# Patient Record
Sex: Male | Born: 1935
Health system: Southern US, Community
[De-identification: ages and names within clinical notes are randomized; demographics above are authoritative.]

## PROBLEM LIST (undated history)

## (undated) DIAGNOSIS — I509 Heart failure, unspecified: Secondary | ICD-10-CM

## (undated) DIAGNOSIS — I209 Angina pectoris, unspecified: Secondary | ICD-10-CM

## (undated) DIAGNOSIS — Z950 Presence of cardiac pacemaker: Secondary | ICD-10-CM

## (undated) DIAGNOSIS — R3121 Asymptomatic microscopic hematuria: Secondary | ICD-10-CM

## (undated) DIAGNOSIS — I4891 Unspecified atrial fibrillation: Secondary | ICD-10-CM

## (undated) DIAGNOSIS — Z7901 Long term (current) use of anticoagulants: Secondary | ICD-10-CM

## (undated) DIAGNOSIS — Z95810 Presence of automatic (implantable) cardiac defibrillator: Secondary | ICD-10-CM

## (undated) DIAGNOSIS — I2510 Atherosclerotic heart disease of native coronary artery without angina pectoris: Secondary | ICD-10-CM

## (undated) DIAGNOSIS — E785 Hyperlipidemia, unspecified: Secondary | ICD-10-CM

## (undated) DIAGNOSIS — I10 Essential (primary) hypertension: Secondary | ICD-10-CM

## (undated) DIAGNOSIS — F0390 Unspecified dementia without behavioral disturbance: Secondary | ICD-10-CM

## (undated) HISTORY — DX: Essential (primary) hypertension: I10

## (undated) HISTORY — PX: CORONARY ARTERY BYPASS GRAFT: SHX141

## (undated) HISTORY — DX: Heart failure, unspecified: I50.9

## (undated) HISTORY — DX: Atherosclerotic heart disease of native coronary artery without angina pectoris: I25.10

## (undated) HISTORY — DX: Hyperlipidemia, unspecified: E78.5

## (undated) HISTORY — PX: MITRAL VALVE REPAIR (MV)/CORONARY ARTERY BYPASS GRAFTING (CABG): SHX5983

---

## 2003-12-10 HISTORY — PX: CORONARY ARTERY BYPASS GRAFT: SHX141

## 2003-12-10 HISTORY — PX: MITRAL VALVULOPLASTY: SHX143

## 2009-09-26 HISTORY — PX: CARDIAC DEFIBRILLATOR PLACEMENT: SHX171

## 2014-08-12 ENCOUNTER — Encounter: Payer: Self-pay | Admitting: Cardiovascular Disease

## 2014-08-12 ENCOUNTER — Ambulatory Visit (INDEPENDENT_AMBULATORY_CARE_PROVIDER_SITE_OTHER): Payer: Medicare Other | Admitting: *Deleted

## 2014-08-12 ENCOUNTER — Ambulatory Visit (INDEPENDENT_AMBULATORY_CARE_PROVIDER_SITE_OTHER): Payer: Medicare Other | Admitting: Pharmacist Clinician (PhC)/ Clinical Pharmacy Specialist

## 2014-08-12 ENCOUNTER — Ambulatory Visit (INDEPENDENT_AMBULATORY_CARE_PROVIDER_SITE_OTHER): Payer: Medicare Other | Admitting: Cardiovascular Disease

## 2014-08-12 VITALS — BP 108/56 | HR 70 | Resp 16 | Ht 71.5 in | Wt 174.8 lb

## 2014-08-12 DIAGNOSIS — Z95810 Presence of automatic (implantable) cardiac defibrillator: Secondary | ICD-10-CM

## 2014-08-12 DIAGNOSIS — I442 Atrioventricular block, complete: Secondary | ICD-10-CM

## 2014-08-12 DIAGNOSIS — I071 Rheumatic tricuspid insufficiency: Secondary | ICD-10-CM

## 2014-08-12 DIAGNOSIS — I351 Nonrheumatic aortic (valve) insufficiency: Secondary | ICD-10-CM

## 2014-08-12 DIAGNOSIS — I4821 Permanent atrial fibrillation: Secondary | ICD-10-CM

## 2014-08-12 DIAGNOSIS — I4891 Unspecified atrial fibrillation: Secondary | ICD-10-CM | POA: Insufficient documentation

## 2014-08-12 DIAGNOSIS — I340 Nonrheumatic mitral (valve) insufficiency: Secondary | ICD-10-CM

## 2014-08-12 DIAGNOSIS — Z7901 Long term (current) use of anticoagulants: Secondary | ICD-10-CM

## 2014-08-12 DIAGNOSIS — I359 Nonrheumatic aortic valve disorder, unspecified: Secondary | ICD-10-CM

## 2014-08-12 DIAGNOSIS — Z79899 Other long term (current) drug therapy: Secondary | ICD-10-CM

## 2014-08-12 DIAGNOSIS — I059 Rheumatic mitral valve disease, unspecified: Secondary | ICD-10-CM

## 2014-08-12 DIAGNOSIS — I079 Rheumatic tricuspid valve disease, unspecified: Secondary | ICD-10-CM

## 2014-08-12 DIAGNOSIS — I10 Essential (primary) hypertension: Secondary | ICD-10-CM

## 2014-08-12 DIAGNOSIS — Z4502 Encounter for adjustment and management of automatic implantable cardiac defibrillator: Secondary | ICD-10-CM

## 2014-08-12 DIAGNOSIS — I2510 Atherosclerotic heart disease of native coronary artery without angina pectoris: Secondary | ICD-10-CM

## 2014-08-12 DIAGNOSIS — Z888 Allergy status to other drugs, medicaments and biological substances status: Secondary | ICD-10-CM

## 2014-08-12 DIAGNOSIS — I5032 Chronic diastolic (congestive) heart failure: Secondary | ICD-10-CM

## 2014-08-12 DIAGNOSIS — R5383 Other fatigue: Secondary | ICD-10-CM

## 2014-08-12 DIAGNOSIS — E782 Mixed hyperlipidemia: Secondary | ICD-10-CM

## 2014-08-12 DIAGNOSIS — D689 Coagulation defect, unspecified: Secondary | ICD-10-CM

## 2014-08-12 DIAGNOSIS — Z789 Other specified health status: Secondary | ICD-10-CM

## 2014-08-12 DIAGNOSIS — R5381 Other malaise: Secondary | ICD-10-CM

## 2014-08-12 LAB — MDC_IDC_ENUM_SESS_TYPE_INCLINIC
HighPow Impedance: 43 Ohm
Lead Channel Impedance Value: 450 Ohm
Lead Channel Impedance Value: 530 Ohm
Lead Channel Setting Pacing Amplitude: 3.5 V
Lead Channel Setting Pacing Pulse Width: 1.5 ms
MDC IDC MSMT LEADCHNL RA IMPEDANCE VALUE: 330 Ohm
MDC IDC PG SERIAL: 7207729
MDC IDC SET LEADCHNL RV PACING AMPLITUDE: 2.5 V
MDC IDC SET LEADCHNL RV PACING PULSEWIDTH: 0.5 ms
MDC IDC STAT BRADY RV PERCENT PACED: 97 %

## 2014-08-12 LAB — POCT INR: INR: 3.4

## 2014-08-12 NOTE — Progress Notes (Signed)
Patient ID: Grant Lawrence, male   DOB: 06-04-1931, 80 y.o.   MRN: [id number]      Reason for office visit Establish new cardiology care and CRT-D follow-up  Mr. Wolf has recently moved to [place] from [place]. He was cared for by Dr. Rolla Etienne at the [facility]. His electrophysiologist was Dr. Mollie Germany.  He is 80 years old and has a rich and complicated cardiac history.   He has atrial fibrillation status post AV node ablation with a St. Jude Unify dual-chamber biventricular pacemaker/defibrillator implanted in October 2010. The device is programmed VVIR secondary to permanent atrial fibrillation. Interrogation of the device today shows that it reached ERI on July 26. He has not received tachyarrhythmia therapies nor are there any episodes of VT/VF recorded. He is on chronic warfarin anticoagulation and I don't think he has ever had a stroke or TIA.  He had a myocardial infarction in 1999. He had severe mitral insufficiency. Cardiac catheterization in July 2005 showed a 70-80% stenosis of the LAD and 80-90% ostial diagonal stenosis. In August 2005 he underwent bypass surgery and mitral valve annuloplasty repair. He has moderate tricuspid insufficiency, mild to moderate aortic insufficiency. He had pulmonary arterial hypertension, not present on last echo. He carries a diagnosis of chronic compensated diastolic heart failure  His most recent echocardiogram in February 2014 showed an improved ejection fraction to 55% (at time of ICD implantation was 25% and had been 45% in September 2011) and "2-3+ (moderate to moderately severe)" mitral insufficiency. Mean transvalvular gradient was 2.1 mm Hg. He had 2+ aortic insufficiency and 3+ tricuspid insufficiency. Last echo calculated pulmonary artery pressure 28 mm Hg. Left atrium 6.1 cm, volume index 54 mL/meter square. LV end systolic diameter 3.7 cm  He has a history of systemic hypertension and mixed hyperlipidemia but has  never smoked and does not have diabetes mellitus. She does not tolerate statins and is on Zetia monotherapy. The records available do not have any labs included.  He has a vocal cord disorder (was seeing Dr. Marissa Calamity in [place]).  His mother is alive at age 12. He has 2 surviving siblings and 2 that have passed away. He is accompanied today by his children. He is married for 86 years, has 2 children and 2 grandchildren.  He walks 7 days a week for 35 minutes at a time and denies angina or dyspnea with activity  Allergies  Allergen Reactions  . Statins     Myalgia    Current Outpatient Prescriptions  Medication Sig Dispense Refill  . aspirin 81 MG tablet Take 81 mg by mouth daily.      . carvedilol (COREG) 3.125 MG tablet Take 3.125 mg by mouth 2 (two) times daily with a meal.      . ezetimibe (ZETIA) 10 MG tablet Take 10 mg by mouth daily.      . hydrochlorothiazide (HYDRODIURIL) 25 MG tablet Take 25 mg by mouth daily.      Marland Kitchen HYDROcodone-acetaminophen (NORCO/VICODIN) 5-325 MG per tablet Take 1 tablet by mouth every 6 (six) hours as needed for moderate pain.      Marland Kitchen lisinopril (PRINIVIL,ZESTRIL) 5 MG tablet Take 5 mg by mouth daily.      . Multiple Vitamins-Minerals (CENTRUM SILVER PO) Take 1 tablet by mouth daily.      Marland Kitchen warfarin (COUMADIN) 5 MG tablet Take 5 mg by mouth daily. 7.5mg  daily except 5mg  Sunday and Tuesday.       No current facility-administered medications  for this visit.    Past Medical History  Diagnosis Date  . CHF (congestive heart failure)   . Coronary artery disease   . Hyperlipidemia   . Hypertension     Past Surgical History  Procedure Laterality Date  . Cardiac defibrillator placement  09/26/2009  . Mitral valve repair (mv)/coronary artery bypass grafting (cabg)    . Coronary artery bypass graft  2005  . Coronary artery bypass graft    . Mitral valvuloplasty  2005    Family History  Problem Relation Age of Onset  . Cancer Father   . Diabetes Brother      History   Social History  . Marital Status: Married    Spouse Name: N/A    Number of Children: N/A  . Years of Education: N/A   Occupational History  . Not on file.   Social History Main Topics  . Smoking status: Never Smoker   . Smokeless tobacco: Not on file  . Alcohol Use: No  . Drug Use: No  . Sexual Activity: Not on file   Other Topics Concern  . Not on file   Social History Narrative  . No narrative on file    Review of systems: The patient specifically denies any chest pain at rest or with exertion, dyspnea at rest or with exertion, orthopnea, paroxysmal nocturnal dyspnea, syncope, palpitations, focal neurological deficits, intermittent claudication, lower extremity edema, unexplained weight gain, cough, hemoptysis or wheezing.  The patient also denies abdominal pain, nausea, vomiting, dysphagia, diarrhea, constipation, polyuria, polydipsia, dysuria, hematuria, frequency, urgency, abnormal bleeding or bruising, fever, chills, unexpected weight changes, mood swings, change in skin or hair texture, change in voice quality, auditory or visual problems, allergic reactions or rashes, new musculoskeletal complaints other than usual "aches and pains".   PHYSICAL EXAM BP 108/56  Pulse 70  Ht 5' 11.5" (1.816 m)  Wt 174 lb 12.8 oz (79.289 kg)  BMI 24.04 kg/m2  General: Alert, oriented x3, no distress Head: no evidence of trauma, PERRL, EOMI, no exophtalmos or lid lag, no myxedema, no xanthelasma; normal ears, nose and oropharynx Neck: normal jugular venous pulsations and no hepatojugular reflux; brisk carotid pulses without delay and no carotid bruits Chest: clear to auscultation, no signs of consolidation by percussion or palpation, normal fremitus, symmetrical and full respiratory excursions, sternotomy scar Cardiovascular: normal position and quality of the apical impulse, regular rhythm, normal first and Dr. Donnal Moat split second heart sounds, very faint apical  systolic murmur, no rubs or gallops Abdomen: no tenderness or distention, no masses by palpation, no abnormal pulsatility or arterial bruits, normal bowel sounds, no hepatosplenomegaly Extremities: no clubbing, cyanosis or edema; 2+ radial, ulnar and brachial pulses bilaterally; 2+ right femoral, posterior tibial and dorsalis pedis pulses; 2+ left femoral, posterior tibial and dorsalis pedis pulses; no subclavian or femoral bruits Neurological: grossly nonfocal   EKG: Atrial fibrillation, biventricular paced  ASSESSMENT AND PLAN s/p CRT-D St Jude 2010 He is pacemaker dependent, status post AV node ablation. Comprehensive interrogation of the device today shows normal lead parameters. The battery reached ERI on July 26. Last max charge was on May 29, 10.4 seconds. No episodes of VT or VF are recorded. Atrial fibrillation burden is 100%. There is 97% and biventricular pacing. All lead parameters are good. LV lead is programmed ring to RV coil. Several episodes of atrial noise reversion recorded. No problems on the ventricular leads. Device is programmed VVIR. Will need device generator replacement in the next few weeks. Would  like to reevaluate left ventricular ejection fraction before making a decision on that. In view of his age, the absence of any delivered tachycardia therapies and the reported improvement in left ventricular ejection fraction, he may well decide to agree to a "downgrade" to a CRT-P. device. I described this option to the patient and his family and recommended that we reevaluate his echo before they make a final decision.  CAD (coronary artery disease)s/p CABG 2005 No symptoms of active coronary insufficiency. Records state she had a a nuclear study in September 2010, but no comment on results. Records also report stenoses in the LAD artery and ostial diagonal artery, but the number and location of his bypasses is unknown  Mitral valve insufficiency s/p annuloplasty 2005 His most  recent echocardiogram describes "moderate to moderately severe" mitral insufficiency. I think this is contradicted by his good clinical status, the absence of left ventricular enlargement, the very innocuous systolic murmur in the very low mean transvalvular gradient of 2 mm Hg (heart rate was not reported on the echo report, but was definitely no less than his programmed pacing rates of 70 beats per minute). Reevaluate by echocardiography.  Aortic insufficiency Echo states this is 2+, I don't hear a murmur.  Mixed hyperlipidemia No labs available. Statin intolerance is reported. I'm not sure which statins were tried.  HTN (hypertension) Good control. Carvedilol and lisinopril are main stay of therapy in view of history of heart failure.  Chronic diastolic heart failure Appears to be clinically euvolemic, NYHA functional class I, not requiring loop diuretic therapy. Reportedly most recent ejection fraction normal at 55%.   Orders Placed This Encounter  Procedures  . CBC  . Comprehensive metabolic panel  . Lipid panel  . APTT  . Protime-INR  . EKG 12-Lead  . 2D Echocardiogram without contrast  . IMPLANTABLE CARDIOVERTER DEFIBRILLATOR (ICD) GENERATOR CHANGE   Meds ordered this encounter  Medications  . warfarin (COUMADIN) 5 MG tablet    Sig: Take 5 mg by mouth daily. 7.5mg  daily except 5mg  Sunday and Tuesday.  . ezetimibe (ZETIA) 10 MG tablet    Sig: Take 10 mg by mouth daily.  . hydrochlorothiazide (HYDRODIURIL) 25 MG tablet    Sig: Take 25 mg by mouth daily.  Marland Kitchen lisinopril (PRINIVIL,ZESTRIL) 5 MG tablet    Sig: Take 5 mg by mouth daily.  . carvedilol (COREG) 3.125 MG tablet    Sig: Take 3.125 mg by mouth 2 (two) times daily with a meal.  . aspirin 81 MG tablet    Sig: Take 81 mg by mouth daily.  . Multiple Vitamins-Minerals (CENTRUM SILVER PO)    Sig: Take 1 tablet by mouth daily.  Marland Kitchen HYDROcodone-acetaminophen (NORCO/VICODIN) 5-325 MG per tablet    Sig: Take 1 tablet by  mouth every 6 (six) hours as needed for moderate pain.    Holli Humbles, MD, [employer] [phone] office [phone] pager

## 2014-08-12 NOTE — Patient Instructions (Addendum)
Your physician has requested that you have an echocardiogram. Echocardiography is a painless test that uses sound waves to create images of your heart. It provides your doctor with information about the size and shape of your heart and how well your heart's chambers and valves are working. This procedure takes approximately one hour. There are no restrictions for this procedure.  You need to have your ICD generator changed out 08/23/2014.  This will be scheduled at [facility] as an out-patient.  Continue your current medications.  Your physician recommends that you return for lab work in: Have this done within a week of ICD being replaced.   You need to have an INR checked in 3 weeks.

## 2014-08-13 ENCOUNTER — Encounter: Payer: Self-pay | Admitting: Cardiovascular Disease

## 2014-08-13 DIAGNOSIS — Z4502 Encounter for adjustment and management of automatic implantable cardiac defibrillator: Secondary | ICD-10-CM | POA: Insufficient documentation

## 2014-08-13 DIAGNOSIS — I10 Essential (primary) hypertension: Secondary | ICD-10-CM | POA: Insufficient documentation

## 2014-08-13 DIAGNOSIS — Z789 Other specified health status: Secondary | ICD-10-CM | POA: Insufficient documentation

## 2014-08-13 DIAGNOSIS — I340 Nonrheumatic mitral (valve) insufficiency: Secondary | ICD-10-CM | POA: Insufficient documentation

## 2014-08-13 DIAGNOSIS — I351 Nonrheumatic aortic (valve) insufficiency: Secondary | ICD-10-CM | POA: Insufficient documentation

## 2014-08-13 DIAGNOSIS — E782 Mixed hyperlipidemia: Secondary | ICD-10-CM | POA: Insufficient documentation

## 2014-08-13 DIAGNOSIS — I2510 Atherosclerotic heart disease of native coronary artery without angina pectoris: Secondary | ICD-10-CM | POA: Insufficient documentation

## 2014-08-13 DIAGNOSIS — Z95810 Presence of automatic (implantable) cardiac defibrillator: Secondary | ICD-10-CM | POA: Insufficient documentation

## 2014-08-13 DIAGNOSIS — I071 Rheumatic tricuspid insufficiency: Secondary | ICD-10-CM | POA: Insufficient documentation

## 2014-08-13 DIAGNOSIS — I442 Atrioventricular block, complete: Secondary | ICD-10-CM | POA: Insufficient documentation

## 2014-08-13 DIAGNOSIS — I5032 Chronic diastolic (congestive) heart failure: Secondary | ICD-10-CM | POA: Insufficient documentation

## 2014-08-13 NOTE — Assessment & Plan Note (Signed)
Echo states this is 2+, I don't hear a murmur.

## 2014-08-13 NOTE — Assessment & Plan Note (Addendum)
He is pacemaker dependent, status post AV node ablation. Comprehensive interrogation of the device today shows normal lead parameters. The battery reached ERI on July 26. Last max charge was on May 29, 10.4 seconds. No episodes of VT or VF are recorded. Atrial fibrillation burden is 100%. There is 97% and biventricular pacing. All lead parameters are good. LV lead is programmed ring to RV coil. Several episodes of atrial noise reversion recorded. No problems on the ventricular leads. Device is programmed VVIR. Will need device generator replacement in the next few weeks. Would like to reevaluate left ventricular ejection fraction before making a decision on that. In view of his age, the absence of any delivered tachycardia therapies and the reported improvement in left ventricular ejection fraction, he may well decide to agree to a "downgrade" to a CRT-P. device. I described this option to the patient and his family and recommended that we reevaluate his echo before they make a final decision.

## 2014-08-13 NOTE — Assessment & Plan Note (Signed)
His most recent echocardiogram describes "moderate to moderately severe" mitral insufficiency. I think this is contradicted by his good clinical status, the absence of left ventricular enlargement, the very innocuous systolic murmur in the very low mean transvalvular gradient of 2 mm Hg (heart rate was not reported on the echo report, but was definitely no less than his programmed pacing rates of 70 beats per minute). Reevaluate by echocardiography.

## 2014-08-13 NOTE — Assessment & Plan Note (Signed)
Good control. Carvedilol and lisinopril are main stay of therapy in view of history of heart failure.

## 2014-08-13 NOTE — Assessment & Plan Note (Signed)
No symptoms of active coronary insufficiency. Records state she had a a nuclear study in September 2010, but no comment on results. Records also report stenoses in the LAD artery and ostial diagonal artery, but the number and location of his bypasses is unknown

## 2014-08-13 NOTE — Assessment & Plan Note (Signed)
Appears to be clinically euvolemic, NYHA functional class I, not requiring loop diuretic therapy. Reportedly most recent ejection fraction normal at 55%.

## 2014-08-13 NOTE — Assessment & Plan Note (Signed)
No labs available. Statin intolerance is reported. I'm not sure which statins were tried.

## 2014-08-17 ENCOUNTER — Ambulatory Visit (HOSPITAL_COMMUNITY)
Admission: RE | Admit: 2014-08-17 | Discharge: 2014-08-17 | Disposition: A | Discharge status: Home / Self Care | Payer: Medicare Other | Source: Ambulatory Visit | Attending: Cardiology | Admitting: Cardiology

## 2014-08-17 DIAGNOSIS — I10 Essential (primary) hypertension: Secondary | ICD-10-CM | POA: Insufficient documentation

## 2014-08-17 DIAGNOSIS — E785 Hyperlipidemia, unspecified: Secondary | ICD-10-CM | POA: Insufficient documentation

## 2014-08-17 DIAGNOSIS — Z954 Presence of other heart-valve replacement: Secondary | ICD-10-CM | POA: Insufficient documentation

## 2014-08-17 DIAGNOSIS — I359 Nonrheumatic aortic valve disorder, unspecified: Secondary | ICD-10-CM

## 2014-08-17 DIAGNOSIS — I4891 Unspecified atrial fibrillation: Secondary | ICD-10-CM

## 2014-08-17 LAB — CBC
HCT: 40.1 % (ref 39.0–52.0)
Hemoglobin: 14 g/dL (ref 13.0–17.0)
MCH: 32.6 pg (ref 26.0–34.0)
MCHC: 34.9 g/dL (ref 30.0–36.0)
MCV: 93.3 fL (ref 78.0–100.0)
Platelets: 140 10*3/uL — ABNORMAL LOW (ref 150–400)
RBC: 4.3 MIL/uL (ref 4.22–5.81)
RDW: 13.4 % (ref 11.5–15.5)
WBC: 5.5 10*3/uL (ref 4.0–10.5)

## 2014-08-17 LAB — COMPREHENSIVE METABOLIC PANEL
ALT: 17 U/L (ref 0–53)
AST: 22 U/L (ref 0–37)
Albumin: 4.1 g/dL (ref 3.5–5.2)
Alkaline Phosphatase: 72 U/L (ref 39–117)
BILIRUBIN TOTAL: 1.1 mg/dL (ref 0.2–1.2)
BUN: 17 mg/dL (ref 6–23)
CO2: 29 mEq/L (ref 19–32)
Calcium: 10.3 mg/dL (ref 8.4–10.5)
Chloride: 104 mEq/L (ref 96–112)
Creat: 0.92 mg/dL (ref 0.50–1.35)
Glucose, Bld: 83 mg/dL (ref 70–99)
Potassium: 4.3 mEq/L (ref 3.5–5.3)
SODIUM: 142 meq/L (ref 135–145)
Total Protein: 6.3 g/dL (ref 6.0–8.3)

## 2014-08-17 LAB — LIPID PANEL
CHOLESTEROL: 161 mg/dL (ref 0–200)
HDL: 53 mg/dL (ref >39)
LDL CALC: 95 mg/dL (ref 0–99)
Total CHOL/HDL Ratio: 3 Ratio
Triglycerides: 65 mg/dL (ref <150)
VLDL: 13 mg/dL (ref 0–40)

## 2014-08-17 LAB — PROTIME-INR
INR: 2.18 — ABNORMAL HIGH (ref <1.50)
PROTHROMBIN TIME: 24.3 s — AB (ref 11.6–15.2)

## 2014-08-17 LAB — APTT: aPTT: 42 seconds — ABNORMAL HIGH (ref 24–37)

## 2014-08-17 NOTE — Progress Notes (Signed)
2D Echo Performed 08/17/2014    Marygrace Drought, RCS

## 2014-08-19 ENCOUNTER — Other Ambulatory Visit: Payer: Self-pay | Admitting: *Deleted

## 2014-08-19 ENCOUNTER — Encounter: Payer: Self-pay | Admitting: Cardiovascular Disease

## 2014-08-19 ENCOUNTER — Telehealth: Payer: Self-pay | Admitting: *Deleted

## 2014-08-19 DIAGNOSIS — Z4502 Encounter for adjustment and management of automatic implantable cardiac defibrillator: Secondary | ICD-10-CM

## 2014-08-19 NOTE — Telephone Encounter (Signed)
Spoke with wife and she thinks he should keep the defibrillator.  They think it will help him to live longer (his mother is 37).  Will send info to Dr. Loletha Grayer and Lucy Chris.

## 2014-08-19 NOTE — Telephone Encounter (Signed)
OK 

## 2014-08-19 NOTE — Telephone Encounter (Signed)
Message copied by Tressa Busman on Fri Aug 19, 2014  4:18 PM ------      Message from: Sanda Klein      Created: Thu Aug 18, 2014 12:56 PM       I would downgrade, but waiting to hear from Mr. Grant Lawrence            ----- Message -----         From: Delma Officer         Sent: 08/18/2014   8:26 AM           To: Sanda Klein, MD, Tressa Busman, CMA            Just checking if you are still doing an icd generator change for this patient or was the decision changed, based on the echo, to downgrade to CRT-P?  Have to get prior auth and need to know in order to choose correct code.            Thanks!!!      Rhonda       ------

## 2014-08-22 ENCOUNTER — Other Ambulatory Visit: Payer: Self-pay | Admitting: *Deleted

## 2014-08-22 DIAGNOSIS — I509 Heart failure, unspecified: Secondary | ICD-10-CM | POA: Diagnosis not present

## 2014-08-22 DIAGNOSIS — I442 Atrioventricular block, complete: Secondary | ICD-10-CM | POA: Diagnosis not present

## 2014-08-22 DIAGNOSIS — Z7901 Long term (current) use of anticoagulants: Secondary | ICD-10-CM | POA: Diagnosis not present

## 2014-08-22 DIAGNOSIS — I10 Essential (primary) hypertension: Secondary | ICD-10-CM | POA: Diagnosis not present

## 2014-08-22 DIAGNOSIS — Z4502 Encounter for adjustment and management of automatic implantable cardiac defibrillator: Secondary | ICD-10-CM

## 2014-08-22 DIAGNOSIS — I4891 Unspecified atrial fibrillation: Secondary | ICD-10-CM | POA: Diagnosis not present

## 2014-08-22 DIAGNOSIS — I5032 Chronic diastolic (congestive) heart failure: Secondary | ICD-10-CM | POA: Diagnosis not present

## 2014-08-22 DIAGNOSIS — Z7982 Long term (current) use of aspirin: Secondary | ICD-10-CM | POA: Diagnosis not present

## 2014-08-22 DIAGNOSIS — E782 Mixed hyperlipidemia: Secondary | ICD-10-CM | POA: Diagnosis not present

## 2014-08-22 DIAGNOSIS — Z951 Presence of aortocoronary bypass graft: Secondary | ICD-10-CM | POA: Diagnosis not present

## 2014-08-22 DIAGNOSIS — I079 Rheumatic tricuspid valve disease, unspecified: Secondary | ICD-10-CM | POA: Diagnosis not present

## 2014-08-22 DIAGNOSIS — I359 Nonrheumatic aortic valve disorder, unspecified: Secondary | ICD-10-CM | POA: Diagnosis not present

## 2014-08-22 MED ORDER — SODIUM CHLORIDE 0.9 % IR SOLN
80.0000 mg | Status: AC
  Filled 2014-08-22: qty 2

## 2014-08-22 MED ORDER — CEFAZOLIN SODIUM-DEXTROSE 2-3 GM-% IV SOLR: 2.0000 g | INTRAVENOUS | Status: AC

## 2014-08-23 ENCOUNTER — Ambulatory Visit (HOSPITAL_COMMUNITY)
Admission: RE | Admit: 2014-08-23 | Discharge: 2014-08-23 | Disposition: A | Discharge status: Home / Self Care | Payer: Medicare Other | Source: Ambulatory Visit | Attending: Cardiovascular Disease | Admitting: Cardiovascular Disease

## 2014-08-23 ENCOUNTER — Encounter (HOSPITAL_COMMUNITY)
Admission: RE | Disposition: A | Discharge status: Home / Self Care | Payer: Self-pay | Source: Ambulatory Visit | Attending: Cardiovascular Disease

## 2014-08-23 DIAGNOSIS — I442 Atrioventricular block, complete: Secondary | ICD-10-CM | POA: Insufficient documentation

## 2014-08-23 DIAGNOSIS — Z7982 Long term (current) use of aspirin: Secondary | ICD-10-CM | POA: Insufficient documentation

## 2014-08-23 DIAGNOSIS — Z95810 Presence of automatic (implantable) cardiac defibrillator: Secondary | ICD-10-CM

## 2014-08-23 DIAGNOSIS — I079 Rheumatic tricuspid valve disease, unspecified: Secondary | ICD-10-CM | POA: Insufficient documentation

## 2014-08-23 DIAGNOSIS — I4891 Unspecified atrial fibrillation: Secondary | ICD-10-CM | POA: Insufficient documentation

## 2014-08-23 DIAGNOSIS — Z7901 Long term (current) use of anticoagulants: Secondary | ICD-10-CM | POA: Insufficient documentation

## 2014-08-23 DIAGNOSIS — I359 Nonrheumatic aortic valve disorder, unspecified: Secondary | ICD-10-CM | POA: Insufficient documentation

## 2014-08-23 DIAGNOSIS — I509 Heart failure, unspecified: Secondary | ICD-10-CM | POA: Insufficient documentation

## 2014-08-23 DIAGNOSIS — I10 Essential (primary) hypertension: Secondary | ICD-10-CM | POA: Insufficient documentation

## 2014-08-23 DIAGNOSIS — I5032 Chronic diastolic (congestive) heart failure: Secondary | ICD-10-CM | POA: Insufficient documentation

## 2014-08-23 DIAGNOSIS — Z951 Presence of aortocoronary bypass graft: Secondary | ICD-10-CM | POA: Insufficient documentation

## 2014-08-23 DIAGNOSIS — Z4502 Encounter for adjustment and management of automatic implantable cardiac defibrillator: Secondary | ICD-10-CM | POA: Diagnosis not present

## 2014-08-23 DIAGNOSIS — E782 Mixed hyperlipidemia: Secondary | ICD-10-CM | POA: Insufficient documentation

## 2014-08-23 HISTORY — PX: IMPLANTABLE CARDIOVERTER DEFIBRILLATOR (ICD) GENERATOR CHANGE: SHX5469

## 2014-08-23 LAB — SURGICAL PCR SCREEN
MRSA, PCR: NEGATIVE
Staphylococcus aureus: NEGATIVE

## 2014-08-23 SURGERY — ICD GENERATOR CHANGE
Anesthesia: LOCAL

## 2014-08-23 MED ORDER — ACETAMINOPHEN 325 MG PO TABS: 325.0000 mg | ORAL_TABLET | ORAL | Status: DC | PRN

## 2014-08-23 MED ORDER — FENTANYL CITRATE 0.05 MG/ML IJ SOLN
INTRAMUSCULAR | Status: AC
  Filled 2014-08-23: qty 2

## 2014-08-23 MED ORDER — ONDANSETRON HCL 4 MG/2ML IJ SOLN: 4.0000 mg | Freq: Four times a day (QID) | INTRAMUSCULAR | Status: DC | PRN

## 2014-08-23 MED ORDER — LIDOCAINE-EPINEPHRINE 1 %-1:100000 IJ SOLN
INTRAMUSCULAR | Status: AC
  Filled 2014-08-23: qty 2

## 2014-08-23 MED ORDER — SODIUM CHLORIDE 0.9 % IV SOLN
INTRAVENOUS | Status: DC
  Administered 2014-08-23: 13:00:00 via INTRAVENOUS

## 2014-08-23 MED ORDER — MUPIROCIN 2 % EX OINT
TOPICAL_OINTMENT | CUTANEOUS | Status: AC
  Administered 2014-08-23: 1 via NASAL
  Filled 2014-08-23: qty 22

## 2014-08-23 MED ORDER — HEPARIN (PORCINE) IN NACL 2-0.9 UNIT/ML-% IJ SOLN
INTRAMUSCULAR | Status: AC
  Filled 2014-08-23: qty 500

## 2014-08-23 MED ORDER — SODIUM CHLORIDE 0.9 % IJ SOLN: 3.0000 mL | INTRAMUSCULAR | Status: DC | PRN

## 2014-08-23 MED ORDER — MIDAZOLAM HCL 5 MG/5ML IJ SOLN
INTRAMUSCULAR | Status: AC
  Filled 2014-08-23: qty 5

## 2014-08-23 MED ORDER — MUPIROCIN 2 % EX OINT
TOPICAL_OINTMENT | Freq: Two times a day (BID) | CUTANEOUS | Status: DC
  Administered 2014-08-23: 1 via NASAL

## 2014-08-23 MED ORDER — SODIUM CHLORIDE 0.9 % IV SOLN: INTRAVENOUS | Status: DC

## 2014-08-23 NOTE — Discharge Instructions (Signed)
Supplemental Discharge Instructions for  °Pacemaker/Defibrillator Patients ° ° ° °WOUND CARE °- Keep the wound area clean and dry.  Remove the dressing the day after you return home (usually 48 hours after the procedure). °- DO NOT SUBMERGE UNDER WATER UNTIL FULLY HEALED (no tub baths, hot tubs, swimming pools, etc.).  °- You  may shower or take a sponge bath after the dressing is removed. DO NOT SOAK the area and do not allow the shower to directly spray on the site. °- If you have staples, these will be removed in the office in 7-14 days. °- If you have tape/steri-strips on your wound, these will fall off; do not pull them off prematurely.   °- No bandage is needed on the site.  DO  NOT apply any creams, oils, or ointments to the wound area. °- If you notice any drainage or discharge from the wound, any swelling, excessive redness or bruising at the site, or if you develop a fever > 101? F after you are discharged home, call the office at once. ° °Special Instructions °- You are still able to use cellular telephones.  Avoid carrying your cellular phone near your device. °- When traveling through airports, show security personnel your identification card to avoid being screened in the metal detectors.  °- Avoid arc welding equipment, MRI testing (magnetic resonance imaging), TENS units (transcutaneous nerve stimulators).  Call the office for questions about other devices. °- Avoid electrical appliances that are in poor condition or are not properly grounded. °- Microwave ovens are safe to be near or to operate. ° °Additional information for defibrillator patients should your device go off: °- If your device goes off ONCE and you feel fine afterward, notify the clinic at [phone]. °- If your device goes off ONCE and you do not feel well afterward, call 911. °- If your device goes off TWICE or more in one day, call 911. ° °DO NOT DRIVE YOURSELF OR A FAMILY MEMBER °WITH A DEFIBRILLATOR TO THE HOSPITAL--CALL  911. ° ° °

## 2014-08-23 NOTE — Progress Notes (Signed)
Unable to obtain lab for PT/INR, Dr Sallyanne Kuster was notified, orders followed. DC pt/inr order.

## 2014-08-23 NOTE — Progress Notes (Signed)
ICD Criteria  Current LVEF:55% ;Obtained > 6 months ago.   EF 25% at time of ICD implantation  NYHA Functional Classification: Class II  Heart Failure History:  Yes, Duration of heart failure since onset is > 9 months  Non-Ischemic Dilated Cardiomyopathy History:  No.  Atrial Fibrillation/Atrial Flutter:  Yes, A-Fib/A-Flutter type: Permanent (>1 year).  Ventricular Tachycardia History:  No.  Cardiac Arrest History:  No  History of Syndromes with Risk of Sudden Death:  No.  Previous ICD:  Yes, ICD Type:  CRT-D, Reason for ICD:  Primary prevention.  25%  Electrophysiology Study: No.  Prior MI: Yes, Most recent MI timeframe is > 40 days.  PPM: No.  OSA:  No  Patient Life Expectancy of >=1 year: Yes.  Anticoagulation Therapy:  Patient is on anticoagulation therapy, anticoagulation was NOT held prior to procedure.   Beta Blocker Therapy:  Yes.   Ace Inhibitor/ARB Therapy:  Yes.

## 2014-08-23 NOTE — H&P (View-Only) (Signed)
Patient ID: Grant Lawrence, male   DOB: 08/24/1931, 80 y.o.   MRN: [id number]      Reason for office visit Establish new cardiology care and CRT-D follow-up  Grant Lawrence has recently moved to [place] from [place]. He was cared for by Dr. Rolla Etienne at the [facility]. His electrophysiologist was Dr. Mollie Germany.  He is 80 years old and has a rich and complicated cardiac history.   He has atrial fibrillation status post AV node ablation with a St. Jude Unify dual-chamber biventricular pacemaker/defibrillator implanted in October 2010. The device is programmed VVIR secondary to permanent atrial fibrillation. Interrogation of the device today shows that it reached ERI on July 26. He has not received tachyarrhythmia therapies nor are there any episodes of VT/VF recorded. He is on chronic warfarin anticoagulation and I don't think he has ever had a stroke or TIA.  He had a myocardial infarction in 1999. He had severe mitral insufficiency. Cardiac catheterization in July 2005 showed a 70-80% stenosis of the LAD and 80-90% ostial diagonal stenosis. In August 2005 he underwent bypass surgery and mitral valve annuloplasty repair. He has moderate tricuspid insufficiency, mild to moderate aortic insufficiency. He had pulmonary arterial hypertension, not present on last echo. He carries a diagnosis of chronic compensated diastolic heart failure  His most recent echocardiogram in February 2014 showed an improved ejection fraction to 55% (at time of ICD implantation was 25% and had been 45% in September 2011) and "2-3+ (moderate to moderately severe)" mitral insufficiency. Mean transvalvular gradient was 2.1 mm Hg. He had 2+ aortic insufficiency and 3+ tricuspid insufficiency. Last echo calculated pulmonary artery pressure 28 mm Hg. Left atrium 6.1 cm, volume index 54 mL/meter square. LV end systolic diameter 3.7 cm  He has a history of systemic hypertension and mixed hyperlipidemia but has  never smoked and does not have diabetes mellitus. She does not tolerate statins and is on Zetia monotherapy. The records available do not have any labs included.  He has a vocal cord disorder (was seeing Dr. Marissa Calamity in [place]).  His mother is alive at age 48. He has 2 surviving siblings and 2 that have passed away. He is accompanied today by his children. He is married for 24 years, has 2 children and 2 grandchildren.  He walks 7 days a week for 35 minutes at a time and denies angina or dyspnea with activity  Allergies  Allergen Reactions  . Statins     Myalgia    Current Outpatient Prescriptions  Medication Sig Dispense Refill  . aspirin 81 MG tablet Take 81 mg by mouth daily.      . carvedilol (COREG) 3.125 MG tablet Take 3.125 mg by mouth 2 (two) times daily with a meal.      . ezetimibe (ZETIA) 10 MG tablet Take 10 mg by mouth daily.      . hydrochlorothiazide (HYDRODIURIL) 25 MG tablet Take 25 mg by mouth daily.      Marland Kitchen HYDROcodone-acetaminophen (NORCO/VICODIN) 5-325 MG per tablet Take 1 tablet by mouth every 6 (six) hours as needed for moderate pain.      Marland Kitchen lisinopril (PRINIVIL,ZESTRIL) 5 MG tablet Take 5 mg by mouth daily.      . Multiple Vitamins-Minerals (CENTRUM SILVER PO) Take 1 tablet by mouth daily.      Marland Kitchen warfarin (COUMADIN) 5 MG tablet Take 5 mg by mouth daily. 7.5mg  daily except 5mg  Sunday and Tuesday.       No current facility-administered medications  for this visit.    Past Medical History  Diagnosis Date  . CHF (congestive heart failure)   . Coronary artery disease   . Hyperlipidemia   . Hypertension     Past Surgical History  Procedure Laterality Date  . Cardiac defibrillator placement  09/26/2009  . Mitral valve repair (mv)/coronary artery bypass grafting (cabg)    . Coronary artery bypass graft  2005  . Coronary artery bypass graft    . Mitral valvuloplasty  2005    Family History  Problem Relation Age of Onset  . Cancer Father   . Diabetes Brother      History   Social History  . Marital Status: Married    Spouse Name: N/A    Number of Children: N/A  . Years of Education: N/A   Occupational History  . Not on file.   Social History Main Topics  . Smoking status: Never Smoker   . Smokeless tobacco: Not on file  . Alcohol Use: No  . Drug Use: No  . Sexual Activity: Not on file   Other Topics Concern  . Not on file   Social History Narrative  . No narrative on file    Review of systems: The patient specifically denies any chest pain at rest or with exertion, dyspnea at rest or with exertion, orthopnea, paroxysmal nocturnal dyspnea, syncope, palpitations, focal neurological deficits, intermittent claudication, lower extremity edema, unexplained weight gain, cough, hemoptysis or wheezing.  The patient also denies abdominal pain, nausea, vomiting, dysphagia, diarrhea, constipation, polyuria, polydipsia, dysuria, hematuria, frequency, urgency, abnormal bleeding or bruising, fever, chills, unexpected weight changes, mood swings, change in skin or hair texture, change in voice quality, auditory or visual problems, allergic reactions or rashes, new musculoskeletal complaints other than usual "aches and pains".   PHYSICAL EXAM BP 108/56  Pulse 70  Ht 5' 11.5" (1.816 m)  Wt 174 lb 12.8 oz (79.289 kg)  BMI 24.04 kg/m2  General: Alert, oriented x3, no distress Head: no evidence of trauma, PERRL, EOMI, no exophtalmos or lid lag, no myxedema, no xanthelasma; normal ears, nose and oropharynx Neck: normal jugular venous pulsations and no hepatojugular reflux; brisk carotid pulses without delay and no carotid bruits Chest: clear to auscultation, no signs of consolidation by percussion or palpation, normal fremitus, symmetrical and full respiratory excursions, sternotomy scar Cardiovascular: normal position and quality of the apical impulse, regular rhythm, normal first and Dr. Donnal Moat split second heart sounds, very faint apical  systolic murmur, no rubs or gallops Abdomen: no tenderness or distention, no masses by palpation, no abnormal pulsatility or arterial bruits, normal bowel sounds, no hepatosplenomegaly Extremities: no clubbing, cyanosis or edema; 2+ radial, ulnar and brachial pulses bilaterally; 2+ right femoral, posterior tibial and dorsalis pedis pulses; 2+ left femoral, posterior tibial and dorsalis pedis pulses; no subclavian or femoral bruits Neurological: grossly nonfocal   EKG: Atrial fibrillation, biventricular paced  ASSESSMENT AND PLAN s/p CRT-D St Jude 2010 He is pacemaker dependent, status post AV node ablation. Comprehensive interrogation of the device today shows normal lead parameters. The battery reached ERI on July 26. Last max charge was on May 29, 10.4 seconds. No episodes of VT or VF are recorded. Atrial fibrillation burden is 100%. There is 97% and biventricular pacing. All lead parameters are good. LV lead is programmed ring to RV coil. Several episodes of atrial noise reversion recorded. No problems on the ventricular leads. Device is programmed VVIR. Will need device generator replacement in the next few weeks. Would  like to reevaluate left ventricular ejection fraction before making a decision on that. In view of his age, the absence of any delivered tachycardia therapies and the reported improvement in left ventricular ejection fraction, he may well decide to agree to a "downgrade" to a CRT-P. device. I described this option to the patient and his family and recommended that we reevaluate his echo before they make a final decision.  CAD (coronary artery disease)s/p CABG 2005 No symptoms of active coronary insufficiency. Records state she had a a nuclear study in September 2010, but no comment on results. Records also report stenoses in the LAD artery and ostial diagonal artery, but the number and location of his bypasses is unknown  Mitral valve insufficiency s/p annuloplasty 2005 His most  recent echocardiogram describes "moderate to moderately severe" mitral insufficiency. I think this is contradicted by his good clinical status, the absence of left ventricular enlargement, the very innocuous systolic murmur in the very low mean transvalvular gradient of 2 mm Hg (heart rate was not reported on the echo report, but was definitely no less than his programmed pacing rates of 70 beats per minute). Reevaluate by echocardiography.  Aortic insufficiency Echo states this is 2+, I don't hear a murmur.  Mixed hyperlipidemia No labs available. Statin intolerance is reported. I'm not sure which statins were tried.  HTN (hypertension) Good control. Carvedilol and lisinopril are main stay of therapy in view of history of heart failure.  Chronic diastolic heart failure Appears to be clinically euvolemic, NYHA functional class I, not requiring loop diuretic therapy. Reportedly most recent ejection fraction normal at 55%.   Orders Placed This Encounter  Procedures  . CBC  . Comprehensive metabolic panel  . Lipid panel  . APTT  . Protime-INR  . EKG 12-Lead  . 2D Echocardiogram without contrast  . IMPLANTABLE CARDIOVERTER DEFIBRILLATOR (ICD) GENERATOR CHANGE   Meds ordered this encounter  Medications  . warfarin (COUMADIN) 5 MG tablet    Sig: Take 5 mg by mouth daily. 7.5mg  daily except 5mg  Sunday and Tuesday.  . ezetimibe (ZETIA) 10 MG tablet    Sig: Take 10 mg by mouth daily.  . hydrochlorothiazide (HYDRODIURIL) 25 MG tablet    Sig: Take 25 mg by mouth daily.  Marland Kitchen lisinopril (PRINIVIL,ZESTRIL) 5 MG tablet    Sig: Take 5 mg by mouth daily.  . carvedilol (COREG) 3.125 MG tablet    Sig: Take 3.125 mg by mouth 2 (two) times daily with a meal.  . aspirin 81 MG tablet    Sig: Take 81 mg by mouth daily.  . Multiple Vitamins-Minerals (CENTRUM SILVER PO)    Sig: Take 1 tablet by mouth daily.  Marland Kitchen HYDROcodone-acetaminophen (NORCO/VICODIN) 5-325 MG per tablet    Sig: Take 1 tablet by  mouth every 6 (six) hours as needed for moderate pain.    Holli Humbles, MD, [place] [phone] office [phone] pager

## 2014-08-23 NOTE — Interval H&P Note (Signed)
History and Physical Interval Note:  08/23/2014 12:20 PM  Grant Lawrence  has presented today for surgery, with the diagnosis of battery depletion  The various methods of treatment have been discussed with the patient and family. After consideration of risks, benefits and other options for treatment, the patient has consented to  Procedure(s): ICD GENERATOR CHANGE (N/A) as a surgical intervention .  The patient's history has been reviewed, patient examined, no change in status, stable for surgery.  I have reviewed the patient's chart and labs.  Questions were answered to the patient's satisfaction.     [name]

## 2014-08-23 NOTE — Op Note (Signed)
Procedure report  Procedure performed:  1. CRT-D generator changeout  2. Light sedation  Reason for procedure:  1. Device generator at elective replacement interval  2. Complete heart block 3. CHF Procedure performed by:  Sanda Klein, MD  Complications:  None  Estimated blood loss:  <5 mL  Medications administered during procedure:  Ancef 2 g intravenously, lidocaine 1% 30 mL locally, fentanyl 25 mcg intravenously, Versed 1 mg intravenously Device details:   [employer]. Jude Unify Assura model number 2353-61W, serial number [id number] Right atrial lead (chronic) St. Jude, model number [phone], serial [id number] (implanted 09/26/09) Right ventricular lead (chronic)  St. Jude [place], model number [id number], serial number [id number] (implanted 09/26/09) Left ventricular lead  St. Jude QuickFlex, model number 1258T-86, serial number [id number] (implanted 09/26/09) Explanted generator St. Jude [place],  model number [id number], serial number  [id number] (implanted 09/26/09)  Procedure details:  After the risks and benefits of the procedure were discussed the patient provided informed consent. She was brought to the cardiac catheter lab in the fasting state. The patient was prepped and draped in usual sterile fashion. Local anesthesia with 1% lidocaine was administered to to the left infraclavicular area. A 5-6cm horizontal incision was made parallel with and 2-3 cm caudal to the left clavicle, in the area of an old scar. An older scar was seen closer to the left clavicle. Using minimal electrocautery and mostly sharp and blunt dissection the prepectoral pocket was opened carefully to avoid injury to the loops of chronic leads. Extensive dissection was not necessary. The device was explanted. The pocket was carefully inspected for hemostasis and flushed with copious amounts of antibiotic solution.  The leads were disconnected from the old generator and testing of the lead parameters later showed  excellent values. The new generator was connected to the chronic leads, with appropriate pacing noted.   The entire system was then carefully inserted in the pocket with care been taking that the leads and device assumed a comfortable position without pressure on the incision. Great care was taken that the leads be located deep to the generator. The pocket was then closed in layers using 2 layers of 2-0 Vicryl and cutaneous staples after which a sterile dressing was applied.   At the end of the procedure the following lead parameters were encountered:   Right atrial lead atrial fibrillation Right ventricular lead sensed R waves  None detected, impedance 430 ohms, threshold 0.75 at 0.5 ms pulse width. High voltage 41 ohms Left ventricular lead impedance 410 ohms, threshold 4.0 at 1.5 ms pulse width.   Sanda Klein, MD, [facility] [phone] office [phone] pager

## 2014-08-24 ENCOUNTER — Ambulatory Visit (INDEPENDENT_AMBULATORY_CARE_PROVIDER_SITE_OTHER): Payer: Medicare Other | Admitting: Pharmacist Clinician (PhC)/ Clinical Pharmacy Specialist

## 2014-08-24 ENCOUNTER — Ambulatory Visit (INDEPENDENT_AMBULATORY_CARE_PROVIDER_SITE_OTHER): Payer: Medicare Other | Admitting: Cardiovascular Disease

## 2014-08-24 ENCOUNTER — Telehealth: Payer: Self-pay | Admitting: Cardiovascular Disease

## 2014-08-24 VITALS — BP 133/72 | HR 69 | Ht 71.0 in | Wt 175.5 lb

## 2014-08-24 DIAGNOSIS — I4891 Unspecified atrial fibrillation: Secondary | ICD-10-CM

## 2014-08-24 DIAGNOSIS — Z95810 Presence of automatic (implantable) cardiac defibrillator: Secondary | ICD-10-CM

## 2014-08-24 DIAGNOSIS — Z7901 Long term (current) use of anticoagulants: Secondary | ICD-10-CM

## 2014-08-24 DIAGNOSIS — I4821 Permanent atrial fibrillation: Secondary | ICD-10-CM

## 2014-08-24 LAB — POCT INR: INR: 2.3

## 2014-08-24 NOTE — Patient Instructions (Signed)
Dr. Sallyanne Kuster recommends that you schedule a follow-up appointment in: next week for wound check.

## 2014-08-24 NOTE — Telephone Encounter (Signed)
Pt had pacemaker put in yesterday,he is bleeding from that spot and bruising. Please call asap.

## 2014-08-24 NOTE — Telephone Encounter (Signed)
Spoke with patient's daughter [name]. Patient is having a lot of bruising don side of chest and is bleeding from pacer site. She states the bandage is about 85% saturated with blood and has not been changed.   Spoke with Pamala Hurry and she says is OK to add patient on today for site check with Dr. Sallyanne Kuster at 2:45.   Scheduler notified to make appointment.

## 2014-08-26 ENCOUNTER — Encounter: Payer: Self-pay | Admitting: Cardiovascular Disease

## 2014-08-26 NOTE — Progress Notes (Signed)
Patient ID: Grant Lawrence, male   DOB: 06-29-1931, 80 y.o.   MRN: [id number]   He had CRT-D generator change out yesterday while on full warfarin anticoagulation. He returns today with slight saturation of his surgical dressing. His INR is 2.3. He has a small area of oozing towards the external corner of the wound where there is a visible tiny bleeding blood vessel in the subdermal layer. Pressure was held and a new dressing was applied. The pocket itself appears soft and is nondistended. There is no evidence of hematoma. The lips of the incision are well opposed and appear healthy. He will return in a few days for staple removal and we'll examine the pocket again at that time.

## 2014-08-26 NOTE — Telephone Encounter (Signed)
Closed encounter °

## 2014-09-02 ENCOUNTER — Ambulatory Visit (INDEPENDENT_AMBULATORY_CARE_PROVIDER_SITE_OTHER): Payer: Medicare Other | Admitting: Cardiovascular Disease

## 2014-09-02 ENCOUNTER — Ambulatory Visit (INDEPENDENT_AMBULATORY_CARE_PROVIDER_SITE_OTHER): Payer: Medicare Other | Admitting: Pharmacist Clinician (PhC)/ Clinical Pharmacy Specialist

## 2014-09-02 ENCOUNTER — Encounter: Payer: Self-pay | Admitting: Cardiovascular Disease

## 2014-09-02 VITALS — BP 155/85 | HR 71 | Ht 71.0 in | Wt 170.5 lb

## 2014-09-02 DIAGNOSIS — I2510 Atherosclerotic heart disease of native coronary artery without angina pectoris: Secondary | ICD-10-CM

## 2014-09-02 DIAGNOSIS — I5032 Chronic diastolic (congestive) heart failure: Secondary | ICD-10-CM

## 2014-09-02 DIAGNOSIS — I4821 Permanent atrial fibrillation: Secondary | ICD-10-CM

## 2014-09-02 DIAGNOSIS — Z7901 Long term (current) use of anticoagulants: Secondary | ICD-10-CM

## 2014-09-02 DIAGNOSIS — I4891 Unspecified atrial fibrillation: Secondary | ICD-10-CM

## 2014-09-02 DIAGNOSIS — Z95810 Presence of automatic (implantable) cardiac defibrillator: Secondary | ICD-10-CM

## 2014-09-02 DIAGNOSIS — I442 Atrioventricular block, complete: Secondary | ICD-10-CM

## 2014-09-02 LAB — MDC_IDC_ENUM_SESS_TYPE_INCLINIC
Brady Statistic RA Percent Paced: 0 %
Brady Statistic RV Percent Paced: 97 %
HighPow Impedance: 46.1631
Lead Channel Impedance Value: 337.5 Ohm
Lead Channel Impedance Value: 487.5 Ohm
Lead Channel Pacing Threshold Amplitude: 0.75 V
Lead Channel Pacing Threshold Amplitude: 2.25 V
Lead Channel Pacing Threshold Pulse Width: 0.5 ms
Lead Channel Pacing Threshold Pulse Width: 0.5 ms
Lead Channel Setting Pacing Amplitude: 2.5 V
Lead Channel Setting Pacing Amplitude: 3.375
Lead Channel Setting Pacing Pulse Width: 0.5 ms
Lead Channel Setting Sensing Sensitivity: 2 mV
MDC IDC MSMT BATTERY REMAINING LONGEVITY: 46.8 mo
MDC IDC MSMT LEADCHNL LV PACING THRESHOLD AMPLITUDE: 2.375 V
MDC IDC MSMT LEADCHNL LV PACING THRESHOLD PULSEWIDTH: 1.2 ms
MDC IDC MSMT LEADCHNL LV PACING THRESHOLD PULSEWIDTH: 1.2 ms
MDC IDC MSMT LEADCHNL RV IMPEDANCE VALUE: 462.5 Ohm
MDC IDC MSMT LEADCHNL RV PACING THRESHOLD AMPLITUDE: 0.75 V
MDC IDC MSMT LEADCHNL RV SENSING INTR AMPL: 12 mV
MDC IDC PG SERIAL: 7207729
MDC IDC SESS DTM: 20150925110740
MDC IDC SET LEADCHNL LV PACING PULSEWIDTH: 1.2 ms
MDC IDC SET ZONE DETECTION INTERVAL: 270 ms
Zone Setting Detection Interval: 320 ms

## 2014-09-02 LAB — POCT INR: INR: 2.5

## 2014-09-02 MED ORDER — HYDROCODONE-ACETAMINOPHEN 5-325 MG PO TABS: 1.0000 | ORAL_TABLET | Freq: Four times a day (QID) | ORAL | Status: DC | PRN

## 2014-09-02 NOTE — Progress Notes (Signed)
Patient ID: Grant Lawrence, male   DOB: 22-Dec-1930, 80 y.o.   MRN: [id number]      Reason for office visit Followup after CRT-D generator change  He has not had any further bleeding at the surgical site although he did develop a large ecchymosis over his left breast. The surgical wound is healing very nicely. The staples were removed today. There is no evidence of pocket hematoma or infection. He does not have signs or symptoms of acute exacerbation of heart failure. Device was checked today and has normal function. He has a chronically high left ventricular pacing threshold (very limited pacing fractures available due to diaphragmatic stimulation) and battery longevity is expected to be only around 4 years.  Allergies  Allergen Reactions  . Statins     Myalgia    Current Outpatient Prescriptions  Medication Sig Dispense Refill  . aspirin 81 MG tablet Take 81 mg by mouth daily.      . carvedilol (COREG) 3.125 MG tablet Take 3.125 mg by mouth 2 (two) times daily with a meal.      . ezetimibe (ZETIA) 10 MG tablet Take 10 mg by mouth daily.      . hydrochlorothiazide (HYDRODIURIL) 25 MG tablet Take 25 mg by mouth daily.      Marland Kitchen HYDROcodone-acetaminophen (NORCO/VICODIN) 5-325 MG per tablet Take 1 tablet by mouth every 6 (six) hours as needed for moderate pain.  40 tablet  0  . lisinopril (PRINIVIL,ZESTRIL) 5 MG tablet Take 5 mg by mouth 2 (two) times daily.       . Multiple Vitamins-Minerals (CENTRUM SILVER PO) Take 1 tablet by mouth daily.      Marland Kitchen warfarin (COUMADIN) 5 MG tablet Take 5 mg by mouth daily. 7.5mg  daily except 5mg  Sunday and Tuesday.       No current facility-administered medications for this visit.    Past Medical History  Diagnosis Date  . CHF (congestive heart failure)   . Coronary artery disease   . Hyperlipidemia   . Hypertension     Past Surgical History  Procedure Laterality Date  . Cardiac defibrillator placement  09/26/2009  . Mitral valve repair  (mv)/coronary artery bypass grafting (cabg)    . Coronary artery bypass graft  2005  . Coronary artery bypass graft    . Mitral valvuloplasty  2005    Family History  Problem Relation Age of Onset  . Cancer Father   . Diabetes Brother     History   Social History  . Marital Status: Married    Spouse Name: N/A    Number of Children: N/A  . Years of Education: N/A   Occupational History  . Not on file.   Social History Main Topics  . Smoking status: Never Smoker   . Smokeless tobacco: Not on file  . Alcohol Use: No  . Drug Use: No  . Sexual Activity: Not on file   Other Topics Concern  . Not on file   Social History Narrative  . No narrative on file    Review of systems: The patient specifically denies any chest pain at rest or with exertion, dyspnea at rest or with exertion, orthopnea, paroxysmal nocturnal dyspnea, syncope, palpitations, focal neurological deficits, intermittent claudication, lower extremity edema, unexplained weight gain, cough, hemoptysis or wheezing.  The patient also denies abdominal pain, nausea, vomiting, dysphagia, diarrhea, constipation, polyuria, polydipsia, dysuria, hematuria, frequency, urgency, abnormal bleeding or bruising, fever, chills, unexpected weight changes, mood swings, change in skin or hair texture,  change in voice quality, auditory or visual problems, allergic reactions or rashes, new musculoskeletal complaints other than usual "aches and pains".   PHYSICAL EXAM BP 155/85  Pulse 71  Ht 5\' 11"  (1.803 m)  Wt 77.338 kg (170 lb 8 oz)  BMI 23.79 kg/m2 General: Alert, oriented x3, no distress  Head: no evidence of trauma, PERRL, EOMI, no exophtalmos or lid lag, no myxedema, no xanthelasma; normal ears, nose and oropharynx  Neck: normal jugular venous pulsations and no hepatojugular reflux; brisk carotid pulses without delay and no carotid bruits  Chest: clear to auscultation, no signs of consolidation by percussion or palpation,  normal fremitus, symmetrical and full respiratory excursions, sternotomy scar  Left subclavian surgical site has a large ecchymosis, but no pocket hematoma, redness, erythema, discharge or excessive warmth. Cardiovascular: normal position and quality of the apical impulse, regular rhythm, normal first and paradoxically split second heart sounds, very faint apical systolic murmur, no rubs or gallops  Abdomen: no tenderness or distention, no masses by palpation, no abnormal pulsatility or arterial bruits, normal bowel sounds, no hepatosplenomegaly  Extremities: no clubbing, cyanosis or edema; 2+ radial, ulnar and brachial pulses bilaterally; 2+ right femoral, posterior tibial and dorsalis pedis pulses; 2+ left femoral, posterior tibial and dorsalis pedis pulses; no subclavian or femoral bruits  Neurological: grossly nonfocal  Lipid Panel     Component Value Date/Time   CHOL 161 08/17/2014 0810   TRIG 65 08/17/2014 0810   HDL 53 08/17/2014 0810   CHOLHDL 3.0 08/17/2014 0810   VLDL 13 08/17/2014 0810   LDLCALC 95 08/17/2014 0810    BMET    Component Value Date/Time   NA 142 08/17/2014 0810   K 4.3 08/17/2014 0810   CL 104 08/17/2014 0810   CO2 29 08/17/2014 0810   GLUCOSE 83 08/17/2014 0810   BUN 17 08/17/2014 0810   CREATININE 0.92 08/17/2014 0810   CALCIUM 10.3 08/17/2014 0810     ASSESSMENT AND PLAN  We'll followup in 6 months in the clinic, continue remote device monitoring via [place] and Coumadin clinic checks (today INR was 2.5) Since he has not yet had a visit with his new primary care physician, I agreed to refill his chronic pain medication until he has his appointment.  Holli Humbles, MD, [place] [phone] office [phone] pager

## 2014-09-02 NOTE — Patient Instructions (Signed)
Remote monitoring is used to monitor your ICD from home. This monitoring reduces the number of office visits required to check your device to one time per year. It allows [place] to keep an eye on the functioning of your device to ensure it is working properly. You are scheduled for a device check from home on 12-05-2014. You may send your transmission at any time that day. If you have a wireless device, the transmission will be sent automatically. After your physician reviews your transmission, you will receive a postcard with your next transmission date.  Your physician recommends that you schedule a follow-up appointment in: 6 months with Dr.Croitoru

## 2014-09-02 NOTE — Progress Notes (Signed)
CRT-D check in clinic by [place].

## 2014-09-07 ENCOUNTER — Encounter: Payer: Self-pay | Admitting: Cardiovascular Disease

## 2014-09-08 ENCOUNTER — Encounter: Payer: Self-pay | Admitting: Cardiovascular Disease

## 2014-09-13 ENCOUNTER — Ambulatory Visit: Payer: Medicare Other | Admitting: Cardiology

## 2014-09-13 ENCOUNTER — Ambulatory Visit: Payer: Medicare Other

## 2014-09-23 ENCOUNTER — Ambulatory Visit (INDEPENDENT_AMBULATORY_CARE_PROVIDER_SITE_OTHER): Payer: Medicare Other | Admitting: Pharmacist Clinician (PhC)/ Clinical Pharmacy Specialist

## 2014-09-23 DIAGNOSIS — I482 Chronic atrial fibrillation: Secondary | ICD-10-CM

## 2014-09-23 DIAGNOSIS — I4891 Unspecified atrial fibrillation: Secondary | ICD-10-CM

## 2014-09-23 DIAGNOSIS — I4821 Permanent atrial fibrillation: Secondary | ICD-10-CM

## 2014-09-23 DIAGNOSIS — Z7901 Long term (current) use of anticoagulants: Secondary | ICD-10-CM

## 2014-09-23 LAB — POCT INR: INR: 2.3

## 2014-10-21 ENCOUNTER — Ambulatory Visit (INDEPENDENT_AMBULATORY_CARE_PROVIDER_SITE_OTHER): Payer: Medicare Other | Admitting: Pharmacist Clinician (PhC)/ Clinical Pharmacy Specialist

## 2014-10-21 DIAGNOSIS — I4891 Unspecified atrial fibrillation: Secondary | ICD-10-CM

## 2014-10-21 DIAGNOSIS — I482 Chronic atrial fibrillation: Secondary | ICD-10-CM

## 2014-10-21 DIAGNOSIS — I4821 Permanent atrial fibrillation: Secondary | ICD-10-CM

## 2014-10-21 DIAGNOSIS — Z7901 Long term (current) use of anticoagulants: Secondary | ICD-10-CM

## 2014-10-21 LAB — POCT INR: INR: 1.8

## 2014-11-17 ENCOUNTER — Encounter (HOSPITAL_COMMUNITY): Payer: Self-pay | Admitting: Cardiovascular Disease

## 2014-11-18 ENCOUNTER — Ambulatory Visit (INDEPENDENT_AMBULATORY_CARE_PROVIDER_SITE_OTHER): Payer: Medicare Other | Admitting: Pharmacist Clinician (PhC)/ Clinical Pharmacy Specialist

## 2014-11-18 DIAGNOSIS — I482 Chronic atrial fibrillation: Secondary | ICD-10-CM

## 2014-11-18 DIAGNOSIS — Z7901 Long term (current) use of anticoagulants: Secondary | ICD-10-CM

## 2014-11-18 DIAGNOSIS — I4891 Unspecified atrial fibrillation: Secondary | ICD-10-CM

## 2014-11-18 DIAGNOSIS — I4821 Permanent atrial fibrillation: Secondary | ICD-10-CM

## 2014-11-18 LAB — POCT INR: INR: 1.5

## 2014-11-25 ENCOUNTER — Telehealth: Payer: Self-pay | Admitting: Internal Medicine

## 2014-11-25 NOTE — Telephone Encounter (Signed)
Patient's  Son in law, Shanon Brow states pt received his flu shot in December at [facility] on [address].

## 2014-11-25 NOTE — Telephone Encounter (Signed)
Noted in chart.

## 2014-12-05 ENCOUNTER — Encounter: Payer: Medicare Other | Admitting: *Deleted

## 2014-12-05 ENCOUNTER — Telehealth: Payer: Self-pay | Admitting: Cardiology

## 2014-12-05 ENCOUNTER — Ambulatory Visit (INDEPENDENT_AMBULATORY_CARE_PROVIDER_SITE_OTHER): Payer: Medicare Other | Admitting: Pharmacist Clinician (PhC)/ Clinical Pharmacy Specialist

## 2014-12-05 DIAGNOSIS — I482 Chronic atrial fibrillation: Secondary | ICD-10-CM

## 2014-12-05 DIAGNOSIS — I4891 Unspecified atrial fibrillation: Secondary | ICD-10-CM

## 2014-12-05 DIAGNOSIS — Z7901 Long term (current) use of anticoagulants: Secondary | ICD-10-CM

## 2014-12-05 DIAGNOSIS — I4821 Permanent atrial fibrillation: Secondary | ICD-10-CM

## 2014-12-05 LAB — POCT INR: INR: 2.3

## 2014-12-05 NOTE — Telephone Encounter (Signed)
LMOVM reminding pt to send remote transmission.   

## 2014-12-06 ENCOUNTER — Ambulatory Visit (INDEPENDENT_AMBULATORY_CARE_PROVIDER_SITE_OTHER): Payer: Medicare Other | Admitting: *Deleted

## 2014-12-06 DIAGNOSIS — I482 Chronic atrial fibrillation: Secondary | ICD-10-CM

## 2014-12-07 ENCOUNTER — Ambulatory Visit: Payer: Medicare Other | Admitting: Pharmacist Clinician (PhC)/ Clinical Pharmacy Specialist

## 2014-12-07 ENCOUNTER — Encounter: Payer: Self-pay | Admitting: Cardiology

## 2014-12-07 LAB — MDC_IDC_ENUM_SESS_TYPE_REMOTE
Battery Remaining Longevity: 42 mo
Battery Voltage: 3.1 V
Brady Statistic RV Percent Paced: 96 %
Date Time Interrogation Session: 20151230031113
HIGH POWER IMPEDANCE MEASURED VALUE: 41 Ohm
HighPow Impedance: 41 Ohm
Lead Channel Impedance Value: 430 Ohm
Lead Channel Pacing Threshold Amplitude: 3.25 V
Lead Channel Pacing Threshold Pulse Width: 0.5 ms
Lead Channel Pacing Threshold Pulse Width: 1.2 ms
Lead Channel Sensing Intrinsic Amplitude: 12 mV
Lead Channel Setting Pacing Amplitude: 4.25 V
Lead Channel Setting Pacing Pulse Width: 1.2 ms
Lead Channel Setting Sensing Sensitivity: 2 mV
MDC IDC MSMT BATTERY REMAINING PERCENTAGE: 91 %
MDC IDC MSMT LEADCHNL LV IMPEDANCE VALUE: 510 Ohm
MDC IDC MSMT LEADCHNL RV PACING THRESHOLD AMPLITUDE: 0.75 V
MDC IDC PG SERIAL: 7207729
MDC IDC SET LEADCHNL RV PACING AMPLITUDE: 2.5 V
MDC IDC SET LEADCHNL RV PACING PULSEWIDTH: 0.5 ms
MDC IDC SET ZONE DETECTION INTERVAL: 320 ms
Zone Setting Detection Interval: 270 ms

## 2014-12-12 NOTE — Progress Notes (Signed)
Remote ICD transmission.   

## 2014-12-15 ENCOUNTER — Telehealth: Payer: Self-pay | Admitting: Cardiovascular Disease

## 2014-12-15 MED ORDER — WARFARIN SODIUM 5 MG PO TABS: ORAL_TABLET | ORAL | Status: DC

## 2014-12-15 NOTE — Telephone Encounter (Signed)
   Takes 5mg  tablet. Current schedule 1.5 tabs QD, 1 tab on Sunday.  2.3 INR last coag visit 12/28

## 2014-12-15 NOTE — Telephone Encounter (Signed)
Pt's daughter called in request a new prescription for the pt's Warfarin because he is out of this medication. Please call  Thanks

## 2014-12-16 ENCOUNTER — Encounter: Payer: Self-pay | Admitting: Cardiovascular Disease

## 2014-12-23 ENCOUNTER — Other Ambulatory Visit: Payer: Self-pay | Admitting: Pharmacist Clinician (PhC)/ Clinical Pharmacy Specialist

## 2014-12-23 ENCOUNTER — Ambulatory Visit (INDEPENDENT_AMBULATORY_CARE_PROVIDER_SITE_OTHER): Payer: Medicare Other | Admitting: Pharmacist Clinician (PhC)/ Clinical Pharmacy Specialist

## 2014-12-23 DIAGNOSIS — I482 Chronic atrial fibrillation: Secondary | ICD-10-CM

## 2014-12-23 DIAGNOSIS — I4821 Permanent atrial fibrillation: Secondary | ICD-10-CM

## 2014-12-23 DIAGNOSIS — I4891 Unspecified atrial fibrillation: Secondary | ICD-10-CM

## 2014-12-23 DIAGNOSIS — Z7901 Long term (current) use of anticoagulants: Secondary | ICD-10-CM

## 2014-12-23 LAB — POCT INR: INR: 3.2

## 2014-12-23 MED ORDER — WARFARIN SODIUM 5 MG PO TABS: ORAL_TABLET | ORAL | Status: DC

## 2015-01-06 ENCOUNTER — Ambulatory Visit (INDEPENDENT_AMBULATORY_CARE_PROVIDER_SITE_OTHER): Payer: Medicare Other | Admitting: Pharmacist Clinician (PhC)/ Clinical Pharmacy Specialist

## 2015-01-06 DIAGNOSIS — I482 Chronic atrial fibrillation: Secondary | ICD-10-CM

## 2015-01-06 DIAGNOSIS — I4821 Permanent atrial fibrillation: Secondary | ICD-10-CM

## 2015-01-06 DIAGNOSIS — I4891 Unspecified atrial fibrillation: Secondary | ICD-10-CM

## 2015-01-06 DIAGNOSIS — Z7901 Long term (current) use of anticoagulants: Secondary | ICD-10-CM

## 2015-01-06 LAB — POCT INR: INR: 2.4

## 2015-01-18 ENCOUNTER — Ambulatory Visit (INDEPENDENT_AMBULATORY_CARE_PROVIDER_SITE_OTHER): Payer: Medicare Other | Admitting: Internal Medicine

## 2015-01-18 ENCOUNTER — Encounter: Payer: Self-pay | Admitting: Internal Medicine

## 2015-01-18 VITALS — BP 140/78 | HR 71 | Resp 20 | Ht 69.5 in | Wt 179.0 lb

## 2015-01-18 DIAGNOSIS — I4821 Permanent atrial fibrillation: Secondary | ICD-10-CM

## 2015-01-18 DIAGNOSIS — I482 Chronic atrial fibrillation: Secondary | ICD-10-CM

## 2015-01-18 DIAGNOSIS — I2510 Atherosclerotic heart disease of native coronary artery without angina pectoris: Secondary | ICD-10-CM | POA: Diagnosis not present

## 2015-01-18 DIAGNOSIS — I5032 Chronic diastolic (congestive) heart failure: Secondary | ICD-10-CM | POA: Diagnosis not present

## 2015-01-18 DIAGNOSIS — I442 Atrioventricular block, complete: Secondary | ICD-10-CM | POA: Diagnosis not present

## 2015-01-18 DIAGNOSIS — I10 Essential (primary) hypertension: Secondary | ICD-10-CM | POA: Diagnosis not present

## 2015-01-18 MED ORDER — HYDROCODONE-ACETAMINOPHEN 5-325 MG PO TABS: 1.0000 | ORAL_TABLET | Freq: Four times a day (QID) | ORAL | Status: DC | PRN

## 2015-01-18 NOTE — Progress Notes (Signed)
   Subjective:    Patient ID: Grant Lawrence, male    DOB: 27-May-1931, 81 y.o.   MRN: [id number]  HPI  Past Medical History  Diagnosis Date  . CHF (congestive heart failure)   . Coronary artery disease   . Hyperlipidemia   . Hypertension     History   Social History  . Marital Status: Married    Spouse Name: N/A  . Number of Children: N/A  . Years of Education: N/A   Occupational History  . Not on file.   Social History Main Topics  . Smoking status: Never Smoker   . Smokeless tobacco: Not on file  . Alcohol Use: No  . Drug Use: No  . Sexual Activity: Not on file   Other Topics Concern  . Not on file   Social History Narrative    Past Surgical History  Procedure Laterality Date  . Cardiac defibrillator placement  09/26/2009  . Mitral valve repair (mv)/coronary artery bypass grafting (cabg)    . Coronary artery bypass graft  2005  . Coronary artery bypass graft    . Mitral valvuloplasty  2005  . Implantable cardioverter defibrillator (icd) generator change N/A 08/23/2014    Procedure: ICD GENERATOR CHANGE;  Surgeon: Sanda Klein, MD;  Location: [facility] CATH LAB;  Service: Cardiovascular;  Laterality: N/A;    Family History  Problem Relation Age of Onset  . Cancer Father   . Diabetes Brother     Allergies  Allergen Reactions  . Statins     Myalgia    Current Outpatient Prescriptions on File Prior to Visit  Medication Sig Dispense Refill  . aspirin 81 MG tablet Take 81 mg by mouth daily.    . carvedilol (COREG) 3.125 MG tablet Take 3.125 mg by mouth 2 (two) times daily with a meal.    . ezetimibe (ZETIA) 10 MG tablet Take 10 mg by mouth daily.    . hydrochlorothiazide (HYDRODIURIL) 25 MG tablet Take 25 mg by mouth daily.    Marland Kitchen HYDROcodone-acetaminophen (NORCO/VICODIN) 5-325 MG per tablet Take 1 tablet by mouth every 6 (six) hours as needed for moderate pain. 40 tablet 0  . lisinopril (PRINIVIL,ZESTRIL) 5 MG tablet Take 5 mg by mouth 2 (two) times daily.       . Multiple Vitamins-Minerals (CENTRUM SILVER PO) Take 1 tablet by mouth daily.    Marland Kitchen warfarin (COUMADIN) 5 MG tablet Take 1.5 tablets by mouth daily or as directed by coumadin clinic. 45 tablet 4   No current facility-administered medications on file prior to visit.    BP 140/78 mmHg  Pulse 71  Resp 20  Ht 5' 9.5" (1.765 m)  Wt 179 lb (81.194 kg)  BMI 26.06 kg/m2  SpO2 95%    Review of Systems     Objective:   Physical Exam        Assessment & Plan:

## 2015-01-18 NOTE — Patient Instructions (Signed)
Limit your sodium (Salt) intake    It is important that you exercise regularly, at least 20 minutes 3 to 4 times per week.  If you develop chest pain or shortness of breath seek  medical attention.  Return in 6 months for follow-up  

## 2015-01-18 NOTE — Progress Notes (Signed)
Subjective:    Patient ID: Grant Lawrence, male    DOB: 1931-07-12, 81 y.o.   MRN: [id number]  HPI  81 year old patient who is seen today to establish with our practice.  He has a complex cardiac history and is followed closely by cardiology.  He has a history mild dyslipidemia and statin intolerance.  He has essential hypertension.  In general doing fairly well.  His cardiac status has been stable   Past Medical History  Diagnosis Date  . CHF (congestive heart failure)   . Coronary artery disease   . Hyperlipidemia   . Hypertension     History   Social History  . Marital Status: Married    Spouse Name: N/A  . Number of Children: N/A  . Years of Education: N/A   Occupational History  . Not on file.   Social History Main Topics  . Smoking status: Never Smoker   . Smokeless tobacco: Not on file  . Alcohol Use: No  . Drug Use: No  . Sexual Activity: Not on file   Other Topics Concern  . Not on file   Social History Narrative    Past Surgical History  Procedure Laterality Date  . Cardiac defibrillator placement  09/26/2009  . Mitral valve repair (mv)/coronary artery bypass grafting (cabg)    . Coronary artery bypass graft  2005  . Coronary artery bypass graft    . Mitral valvuloplasty  2005  . Implantable cardioverter defibrillator (icd) generator change N/A 08/23/2014    Procedure: ICD GENERATOR CHANGE;  Surgeon: Sanda Klein, MD;  Location: [facility] CATH LAB;  Service: Cardiovascular;  Laterality: N/A;    Family History  Problem Relation Age of Onset  . Cancer Father   . Diabetes Brother     Allergies  Allergen Reactions  . Statins     Myalgia    Current Outpatient Prescriptions on File Prior to Visit  Medication Sig Dispense Refill  . aspirin 81 MG tablet Take 81 mg by mouth daily.    . carvedilol (COREG) 3.125 MG tablet Take 3.125 mg by mouth 2 (two) times daily with a meal.    . ezetimibe (ZETIA) 10 MG tablet Take 10 mg by mouth daily.    .  hydrochlorothiazide (HYDRODIURIL) 25 MG tablet Take 25 mg by mouth daily.    Marland Kitchen HYDROcodone-acetaminophen (NORCO/VICODIN) 5-325 MG per tablet Take 1 tablet by mouth every 6 (six) hours as needed for moderate pain. 40 tablet 0  . lisinopril (PRINIVIL,ZESTRIL) 5 MG tablet Take 5 mg by mouth 2 (two) times daily.     . Multiple Vitamins-Minerals (CENTRUM SILVER PO) Take 1 tablet by mouth daily.    Marland Kitchen warfarin (COUMADIN) 5 MG tablet Take 1.5 tablets by mouth daily or as directed by coumadin clinic. 45 tablet 4   No current facility-administered medications on file prior to visit.    BP 140/78 mmHg  Pulse 71  Resp 20  Ht 5' 9.5" (1.765 m)  Wt 179 lb (81.194 kg)  BMI 26.06 kg/m2  SpO2 95%    Review of Systems     Objective:   Physical Exam  Constitutional: He appears well-developed and well-nourished.  HENT:  Head: Normocephalic and atraumatic.  Right Ear: External ear normal.  Left Ear: External ear normal.  Nose: Nose normal.  Mouth/Throat: Oropharynx is clear and moist.  Eyes: Conjunctivae and EOM are normal. Pupils are equal, round, and reactive to light. No scleral icterus.  Neck: Normal range of motion. Neck supple.  No JVD present. No thyromegaly present.  Cardiovascular: Regular rhythm and normal heart sounds.  Exam reveals no gallop and no friction rub.   No murmur heard. Sternotomy scar Rhythm regular Pacemaker left upper anterior chest wall area  Dorsalis pedis pulses full.  Posterior tibial pulses difficult to palpate  Pulmonary/Chest: Effort normal and breath sounds normal. He exhibits no tenderness.  Abdominal: Soft. Bowel sounds are normal. He exhibits no distension and no mass. There is no tenderness.  Genitourinary: Penis normal. Guaiac negative stool.  Hernia into the right scrotal sac Plus 4.  Enlarged prostate  Musculoskeletal: Normal range of motion. He exhibits no edema or tenderness.  Lymphadenopathy:    He has no cervical adenopathy.  Neurological: He is  alert. He has normal reflexes. No cranial nerve deficit. Coordination normal.  Skin: Skin is warm and dry. No rash noted.  Psychiatric: He has a normal mood and affect. His behavior is normal.          Assessment & Plan:   Preventive health examination Coronary artery disease History complete heart block, status post AV node ablation Diastolic heart failure Status post ICD Permanent atrial fibrillation.  Continue Coumadin anticoagulation BPH Statin intolerance.  Continue Zetia

## 2015-01-18 NOTE — Progress Notes (Signed)
Pre visit review using our clinic review tool, if applicable. No additional management support is needed unless otherwise documented below in the visit note. 

## 2015-01-20 ENCOUNTER — Telehealth: Payer: Self-pay | Admitting: Internal Medicine

## 2015-01-20 NOTE — Telephone Encounter (Signed)
emmi emailed °

## 2015-02-03 ENCOUNTER — Ambulatory Visit (INDEPENDENT_AMBULATORY_CARE_PROVIDER_SITE_OTHER): Payer: Medicare Other | Admitting: Pharmacist Clinician (PhC)/ Clinical Pharmacy Specialist

## 2015-02-03 DIAGNOSIS — I4891 Unspecified atrial fibrillation: Secondary | ICD-10-CM | POA: Diagnosis not present

## 2015-02-03 DIAGNOSIS — I482 Chronic atrial fibrillation: Secondary | ICD-10-CM

## 2015-02-03 DIAGNOSIS — I4821 Permanent atrial fibrillation: Secondary | ICD-10-CM

## 2015-02-03 DIAGNOSIS — Z7901 Long term (current) use of anticoagulants: Secondary | ICD-10-CM | POA: Diagnosis not present

## 2015-02-03 LAB — POCT INR: INR: 1.4

## 2015-02-17 ENCOUNTER — Ambulatory Visit (INDEPENDENT_AMBULATORY_CARE_PROVIDER_SITE_OTHER): Payer: Medicare Other | Admitting: Pharmacist Clinician (PhC)/ Clinical Pharmacy Specialist

## 2015-02-17 DIAGNOSIS — Z7901 Long term (current) use of anticoagulants: Secondary | ICD-10-CM | POA: Diagnosis not present

## 2015-02-17 DIAGNOSIS — I4821 Permanent atrial fibrillation: Secondary | ICD-10-CM

## 2015-02-17 DIAGNOSIS — I4891 Unspecified atrial fibrillation: Secondary | ICD-10-CM

## 2015-02-17 DIAGNOSIS — I482 Chronic atrial fibrillation: Secondary | ICD-10-CM | POA: Diagnosis not present

## 2015-02-17 LAB — POCT INR: INR: 2.3

## 2015-02-28 ENCOUNTER — Other Ambulatory Visit (HOSPITAL_COMMUNITY): Payer: Self-pay | Admitting: *Deleted

## 2015-02-28 ENCOUNTER — Ambulatory Visit (INDEPENDENT_AMBULATORY_CARE_PROVIDER_SITE_OTHER): Payer: Medicare Other | Admitting: Internal Medicine

## 2015-02-28 ENCOUNTER — Inpatient Hospital Stay (HOSPITAL_COMMUNITY): Admission: RE | Admit: 2015-02-28 | Payer: Medicare Other | Source: Ambulatory Visit

## 2015-02-28 ENCOUNTER — Ambulatory Visit (HOSPITAL_COMMUNITY)
Admission: RE | Admit: 2015-02-28 | Discharge: 2015-02-28 | Disposition: A | Discharge status: Home / Self Care | Payer: Medicare Other | Source: Ambulatory Visit | Attending: Cardiovascular Disease | Admitting: Cardiovascular Disease

## 2015-02-28 ENCOUNTER — Encounter: Payer: Self-pay | Admitting: Internal Medicine

## 2015-02-28 DIAGNOSIS — M7120 Synovial cyst of popliteal space [Baker], unspecified knee: Secondary | ICD-10-CM | POA: Diagnosis not present

## 2015-02-28 DIAGNOSIS — I351 Nonrheumatic aortic (valve) insufficiency: Secondary | ICD-10-CM

## 2015-02-28 DIAGNOSIS — I482 Chronic atrial fibrillation: Secondary | ICD-10-CM | POA: Insufficient documentation

## 2015-02-28 DIAGNOSIS — I10 Essential (primary) hypertension: Secondary | ICD-10-CM

## 2015-02-28 DIAGNOSIS — I4821 Permanent atrial fibrillation: Secondary | ICD-10-CM

## 2015-02-28 DIAGNOSIS — R600 Localized edema: Secondary | ICD-10-CM

## 2015-02-28 DIAGNOSIS — I5032 Chronic diastolic (congestive) heart failure: Secondary | ICD-10-CM | POA: Diagnosis not present

## 2015-02-28 NOTE — Patient Instructions (Signed)
Limit your sodium (Salt) intake  Keep right leg elevated as much as possible  Venous Doppler study as discussed

## 2015-02-28 NOTE — Progress Notes (Signed)
Bilateral lower extremity venous duplex completed. No evidence for DVT or SVT. There is evidence for a possible Baker's cyst in the popliteal fossa measuring 3.3 x 1.8 x 3.8 centimeters with evidence for low level echoes within the cyst. Brianna L Mazza,RVT

## 2015-02-28 NOTE — Progress Notes (Signed)
Pre visit review using our clinic review tool, if applicable. No additional management support is needed unless otherwise documented below in the visit note. 

## 2015-02-28 NOTE — Progress Notes (Signed)
Subjective:    Patient ID: Grant Lawrence, male    DOB: [birth year], 81 y.o.   MRN: [id number]  HPI 81 year old patient who is on chronic Coumadin anticoagulation due to atrial fibrillation.  He has coronary artery disease status post CABG, and a history of diastolic dysfunction.  He presents with a two-week history of swelling and mild discomfort involving the right leg area.  Denies any pulmonary complaints. Medical regimen includes hydrochlorothiazide.  No history of prior DVT or pulmonary embolism.  Past Medical History  Diagnosis Date  . CHF (congestive heart failure)   . Coronary artery disease   . Hyperlipidemia   . Hypertension     History   Social History  . Marital Status: Married    Spouse Name: N/A  . Number of Children: N/A  . Years of Education: N/A   Occupational History  . Not on file.   Social History Main Topics  . Smoking status: Never Smoker   . Smokeless tobacco: Not on file  . Alcohol Use: No  . Drug Use: No  . Sexual Activity: Not on file   Other Topics Concern  . Not on file   Social History Narrative    Past Surgical History  Procedure Laterality Date  . Cardiac defibrillator placement  09/26/2009  . Mitral valve repair (mv)/coronary artery bypass grafting (cabg)    . Coronary artery bypass graft  2005  . Coronary artery bypass graft    . Mitral valvuloplasty  2005  . Implantable cardioverter defibrillator (icd) generator change N/A 08/23/2014    Procedure: ICD GENERATOR CHANGE;  Surgeon: Sanda Klein, MD;  Location: [facility] CATH LAB;  Service: Cardiovascular;  Laterality: N/A;    Family History  Problem Relation Age of Onset  . Cancer Father   . Diabetes Brother     Allergies  Allergen Reactions  . Statins     Myalgia    Current Outpatient Prescriptions on File Prior to Visit  Medication Sig Dispense Refill  . aspirin 81 MG tablet Take 81 mg by mouth daily.    . carvedilol (COREG) 3.125 MG tablet Take 3.125 mg by mouth 2 (two)  times daily with a meal.    . ezetimibe (ZETIA) 10 MG tablet Take 10 mg by mouth daily.    . Glucos-Chondroit-Hyaluron-MSM (GLUCOSAMINE CHONDROITIN JOINT PO) Take 2 tablets by mouth daily. 1500 mg/ 1200 mg    . hydrochlorothiazide (HYDRODIURIL) 25 MG tablet Take 25 mg by mouth daily.    Marland Kitchen HYDROcodone-acetaminophen (NORCO/VICODIN) 5-325 MG per tablet Take 1 tablet by mouth every 6 (six) hours as needed for moderate pain. 40 tablet 0  . lisinopril (PRINIVIL,ZESTRIL) 5 MG tablet Take 5 mg by mouth 2 (two) times daily.     . Multiple Vitamins-Minerals (CENTRUM SILVER PO) Take 1 tablet by mouth daily.    Marland Kitchen warfarin (COUMADIN) 5 MG tablet Take 1.5 tablets by mouth daily or as directed by coumadin clinic. 45 tablet 4   No current facility-administered medications on file prior to visit.    BP 138/70 mmHg  Pulse 82  Temp(Src) 97.6 F (36.4 C) (Oral)  Resp 20  Ht 5' 9.5" (1.765 m)  Wt 183 lb (83.008 kg)  BMI 26.65 kg/m2  SpO2 96%      Review of Systems  Constitutional: Negative for fever, chills, appetite change and fatigue.  HENT: Negative for congestion, dental problem, ear pain, hearing loss, sore throat, tinnitus, trouble swallowing and voice change.   Eyes: Negative for pain,  discharge and visual disturbance.  Respiratory: Negative for cough, chest tightness, wheezing and stridor.   Cardiovascular: Positive for leg swelling. Negative for chest pain and palpitations.  Gastrointestinal: Negative for nausea, vomiting, abdominal pain, diarrhea, constipation, blood in stool and abdominal distention.  Genitourinary: Negative for urgency, hematuria, flank pain, discharge, difficulty urinating and genital sores.  Musculoskeletal: Negative for myalgias, back pain, joint swelling, arthralgias, gait problem and neck stiffness.  Skin: Negative for rash.  Neurological: Negative for dizziness, syncope, speech difficulty, weakness, numbness and headaches.  Hematological: Negative for adenopathy.  Does not bruise/bleed easily.  Psychiatric/Behavioral: Negative for behavioral problems and dysphoric mood. The patient is not nervous/anxious.        Objective:   Physical Exam  Constitutional: He is oriented to person, place, and time. He appears well-developed.  HENT:  Head: Normocephalic.  Right Ear: External ear normal.  Left Ear: External ear normal.  Eyes: Conjunctivae and EOM are normal.  Neck: Normal range of motion.  Cardiovascular: Normal rate, regular rhythm and normal heart sounds.   Pulmonary/Chest: Breath sounds normal. No respiratory distress. He has no rales.  Abdominal: Bowel sounds are normal.  Musculoskeletal: Normal range of motion. He exhibits edema. He exhibits no tenderness.  Edematous changes involving the right leg Scattered bilateral varicosities  Neurological: He is alert and oriented to person, place, and time.  Psychiatric: He has a normal mood and affect. His behavior is normal.          Assessment & Plan:   Right leg edema in a patient on chronic Coumadin anticoagulation.  Will schedule a venous Doppler to assess vascular competence and to rule out acute DVT.  Probable venous insufficiency.  Will place on low salt diet.  Encouraged elevation.  May need compression hose Chronic diastolic heart failure Coronary artery disease Hypertension, well-controlled

## 2015-03-01 ENCOUNTER — Encounter (HOSPITAL_COMMUNITY): Payer: Medicare Other

## 2015-03-01 ENCOUNTER — Telehealth: Payer: Self-pay | Admitting: Internal Medicine

## 2015-03-01 DIAGNOSIS — R600 Localized edema: Secondary | ICD-10-CM

## 2015-03-01 DIAGNOSIS — M7121 Synovial cyst of popliteal space [Baker], right knee: Secondary | ICD-10-CM

## 2015-03-01 NOTE — Telephone Encounter (Signed)
Reviewed preliminary report in chart with Dr. Raliegh Ip and he said if pt still having pain and swelling do referral to Ortho. Tell pt negative for DVT, possible evidence for Bakers cyst.

## 2015-03-01 NOTE — Telephone Encounter (Signed)
Pt daughter would like result of venous doppler today.

## 2015-03-01 NOTE — Telephone Encounter (Signed)
Please see message and advise 

## 2015-03-01 NOTE — Telephone Encounter (Signed)
Spoke to pt's daughter [name], told her Dr.K reviewed the preliminary report and it was negative for DVT, but showed possible Bakers cyst behind knee. [name] verbalized understanding. Asked [name] if pt still having pain and swelling? [name] said yes. Told her Dr.K said will send referral for Ortho to see him and someone will contact you regarding an appointment. [name] verbalized understanding. Order for referral to Ortho done.

## 2015-03-06 DIAGNOSIS — M2241 Chondromalacia patellae, right knee: Secondary | ICD-10-CM | POA: Diagnosis not present

## 2015-03-06 DIAGNOSIS — M1711 Unilateral primary osteoarthritis, right knee: Secondary | ICD-10-CM | POA: Diagnosis not present

## 2015-03-07 DIAGNOSIS — Z08 Encounter for follow-up examination after completed treatment for malignant neoplasm: Secondary | ICD-10-CM | POA: Diagnosis not present

## 2015-03-07 DIAGNOSIS — Z85828 Personal history of other malignant neoplasm of skin: Secondary | ICD-10-CM | POA: Diagnosis not present

## 2015-03-07 DIAGNOSIS — L821 Other seborrheic keratosis: Secondary | ICD-10-CM | POA: Diagnosis not present

## 2015-03-07 DIAGNOSIS — L578 Other skin changes due to chronic exposure to nonionizing radiation: Secondary | ICD-10-CM | POA: Diagnosis not present

## 2015-03-09 ENCOUNTER — Encounter: Payer: Self-pay | Admitting: *Deleted

## 2015-03-17 ENCOUNTER — Ambulatory Visit (INDEPENDENT_AMBULATORY_CARE_PROVIDER_SITE_OTHER): Payer: Medicare Other | Admitting: Pharmacist Clinician (PhC)/ Clinical Pharmacy Specialist

## 2015-03-17 DIAGNOSIS — I482 Chronic atrial fibrillation: Secondary | ICD-10-CM

## 2015-03-17 DIAGNOSIS — I4821 Permanent atrial fibrillation: Secondary | ICD-10-CM

## 2015-03-17 DIAGNOSIS — Z7901 Long term (current) use of anticoagulants: Secondary | ICD-10-CM | POA: Diagnosis not present

## 2015-03-17 DIAGNOSIS — I4891 Unspecified atrial fibrillation: Secondary | ICD-10-CM

## 2015-03-17 LAB — POCT INR: INR: 2.8

## 2015-03-30 ENCOUNTER — Telehealth: Payer: Self-pay | Admitting: Internal Medicine

## 2015-03-30 ENCOUNTER — Telehealth: Payer: Self-pay | Admitting: *Deleted

## 2015-03-30 MED ORDER — HYDROCODONE-ACETAMINOPHEN 5-325 MG PO TABS: 1.0000 | ORAL_TABLET | Freq: Four times a day (QID) | ORAL | Status: DC | PRN

## 2015-03-30 NOTE — Telephone Encounter (Signed)
Pt notified Rx ready for pickup. Rx printed and signed by Dr. Todd.  

## 2015-03-30 NOTE — Telephone Encounter (Signed)
[name] is requesting re-fill on HYDROcodone-acetaminophen (NORCO/VICODIN) 5-325 MG per tablet

## 2015-03-30 NOTE — Telephone Encounter (Signed)
Informed pt's wife that the pt received the "deliquent device check" letter bc he had a recall in [place] for 02-2015 and the appt was never made. Will defer scheduling of the appt to [place].

## 2015-04-03 ENCOUNTER — Telehealth: Payer: Self-pay | Admitting: Cardiovascular Disease

## 2015-04-03 NOTE — Telephone Encounter (Signed)
Closed Encounter  °

## 2015-04-14 ENCOUNTER — Ambulatory Visit (INDEPENDENT_AMBULATORY_CARE_PROVIDER_SITE_OTHER): Payer: Medicare Other | Admitting: Pharmacist Clinician (PhC)/ Clinical Pharmacy Specialist

## 2015-04-14 DIAGNOSIS — Z7901 Long term (current) use of anticoagulants: Secondary | ICD-10-CM

## 2015-04-14 DIAGNOSIS — I482 Chronic atrial fibrillation: Secondary | ICD-10-CM

## 2015-04-14 DIAGNOSIS — I4821 Permanent atrial fibrillation: Secondary | ICD-10-CM

## 2015-04-14 DIAGNOSIS — I4891 Unspecified atrial fibrillation: Secondary | ICD-10-CM | POA: Diagnosis not present

## 2015-04-14 LAB — POCT INR: INR: 2.5

## 2015-04-17 ENCOUNTER — Telehealth: Payer: Self-pay | Admitting: Internal Medicine

## 2015-04-17 MED ORDER — LISINOPRIL 5 MG PO TABS: 5.0000 mg | ORAL_TABLET | Freq: Two times a day (BID) | ORAL | Status: DC

## 2015-04-17 MED ORDER — CARVEDILOL 3.125 MG PO TABS: 3.1250 mg | ORAL_TABLET | Freq: Two times a day (BID) | ORAL | Status: DC

## 2015-04-17 NOTE — Telephone Encounter (Signed)
[name] notified Rx's sent to pharmacy.[name] verbalized understanding.

## 2015-04-17 NOTE — Telephone Encounter (Signed)
Pt daughter called and is asking if Dr Raliegh Ip can refill the following medicine  carvedilol (COREG) 3.125 MG tablet  Michela Pitcher he takes 2 daily     lisinopril (PRINIVIL,ZESTRIL) 5 MG tablet  Takes 2 daily    [place]

## 2015-05-05 ENCOUNTER — Telehealth: Payer: Self-pay | Admitting: Internal Medicine

## 2015-05-05 MED ORDER — HYDROCHLOROTHIAZIDE 25 MG PO TABS: 25.0000 mg | ORAL_TABLET | Freq: Every day | ORAL | Status: DC

## 2015-05-05 NOTE — Telephone Encounter (Signed)
Left message on voicemail Rx sent to pharmacy.

## 2015-05-05 NOTE — Telephone Encounter (Signed)
[name] is requesting on hydrochlorothiazide (HYDRODIURIL) 25 MG tablet sent to [id number] - [place], [place] - [address].

## 2015-05-12 ENCOUNTER — Telehealth: Payer: Self-pay | Admitting: Internal Medicine

## 2015-05-12 ENCOUNTER — Ambulatory Visit (INDEPENDENT_AMBULATORY_CARE_PROVIDER_SITE_OTHER): Payer: Medicare Other | Admitting: Pharmacist Clinician (PhC)/ Clinical Pharmacy Specialist

## 2015-05-12 DIAGNOSIS — I4891 Unspecified atrial fibrillation: Secondary | ICD-10-CM

## 2015-05-12 DIAGNOSIS — Z7901 Long term (current) use of anticoagulants: Secondary | ICD-10-CM

## 2015-05-12 DIAGNOSIS — I482 Chronic atrial fibrillation: Secondary | ICD-10-CM | POA: Diagnosis not present

## 2015-05-12 DIAGNOSIS — I4821 Permanent atrial fibrillation: Secondary | ICD-10-CM

## 2015-05-12 LAB — POCT INR: INR: 2.4

## 2015-05-12 MED ORDER — WARFARIN SODIUM 5 MG PO TABS: ORAL_TABLET | ORAL | Status: DC

## 2015-05-12 NOTE — Telephone Encounter (Signed)
Spoke to pt's wife Arbie Cookey, told her Rx ready for pickup. Arbie Cookey said they will pick it up on Monday. Told her that is fine.

## 2015-05-12 NOTE — Telephone Encounter (Signed)
Pt need a refill on his med Hydrocod/acet 5-325mg  tab qua. Please call when ready.

## 2015-05-12 NOTE — Telephone Encounter (Signed)
Pt requesting refill on Hydrocodone, okay to fill?

## 2015-05-12 NOTE — Telephone Encounter (Signed)
ok 

## 2015-05-15 MED ORDER — HYDROCODONE-ACETAMINOPHEN 5-325 MG PO TABS: 1.0000 | ORAL_TABLET | Freq: Four times a day (QID) | ORAL | Status: DC | PRN

## 2015-05-15 NOTE — Telephone Encounter (Signed)
Rx printed and signed.  

## 2015-06-01 DIAGNOSIS — M1711 Unilateral primary osteoarthritis, right knee: Secondary | ICD-10-CM | POA: Diagnosis not present

## 2015-06-13 ENCOUNTER — Encounter: Payer: Medicare Other | Admitting: Cardiovascular Disease

## 2015-06-20 ENCOUNTER — Ambulatory Visit (INDEPENDENT_AMBULATORY_CARE_PROVIDER_SITE_OTHER): Payer: Medicare Other | Admitting: *Deleted

## 2015-06-20 ENCOUNTER — Ambulatory Visit (INDEPENDENT_AMBULATORY_CARE_PROVIDER_SITE_OTHER): Payer: Medicare Other | Admitting: Cardiovascular Disease

## 2015-06-20 ENCOUNTER — Encounter: Payer: Self-pay | Admitting: Cardiovascular Disease

## 2015-06-20 VITALS — BP 102/60 | HR 83 | Ht 71.0 in | Wt 177.0 lb

## 2015-06-20 DIAGNOSIS — I442 Atrioventricular block, complete: Secondary | ICD-10-CM | POA: Diagnosis not present

## 2015-06-20 DIAGNOSIS — I4821 Permanent atrial fibrillation: Secondary | ICD-10-CM

## 2015-06-20 DIAGNOSIS — Z5181 Encounter for therapeutic drug level monitoring: Secondary | ICD-10-CM

## 2015-06-20 DIAGNOSIS — I5032 Chronic diastolic (congestive) heart failure: Secondary | ICD-10-CM | POA: Diagnosis not present

## 2015-06-20 DIAGNOSIS — I10 Essential (primary) hypertension: Secondary | ICD-10-CM | POA: Diagnosis not present

## 2015-06-20 DIAGNOSIS — I4891 Unspecified atrial fibrillation: Secondary | ICD-10-CM

## 2015-06-20 DIAGNOSIS — I482 Chronic atrial fibrillation: Secondary | ICD-10-CM

## 2015-06-20 DIAGNOSIS — Z95810 Presence of automatic (implantable) cardiac defibrillator: Secondary | ICD-10-CM

## 2015-06-20 DIAGNOSIS — I493 Ventricular premature depolarization: Secondary | ICD-10-CM

## 2015-06-20 DIAGNOSIS — I340 Nonrheumatic mitral (valve) insufficiency: Secondary | ICD-10-CM

## 2015-06-20 DIAGNOSIS — Z7901 Long term (current) use of anticoagulants: Secondary | ICD-10-CM | POA: Diagnosis not present

## 2015-06-20 DIAGNOSIS — E782 Mixed hyperlipidemia: Secondary | ICD-10-CM

## 2015-06-20 DIAGNOSIS — I2510 Atherosclerotic heart disease of native coronary artery without angina pectoris: Secondary | ICD-10-CM

## 2015-06-20 LAB — CUP PACEART INCLINIC DEVICE CHECK
Battery Remaining Longevity: 37.2 mo
Brady Statistic RA Percent Paced: 0 %
Brady Statistic RV Percent Paced: 93 %
HIGH POWER IMPEDANCE MEASURED VALUE: 42.9058
Lead Channel Impedance Value: 450 Ohm
Lead Channel Impedance Value: 512.5 Ohm
Lead Channel Pacing Threshold Amplitude: 3.125 V
Lead Channel Pacing Threshold Amplitude: 4 V
Lead Channel Pacing Threshold Pulse Width: 0.5 ms
Lead Channel Pacing Threshold Pulse Width: 0.5 ms
Lead Channel Pacing Threshold Pulse Width: 1.2 ms
Lead Channel Pacing Threshold Pulse Width: 1.2 ms
Lead Channel Sensing Intrinsic Amplitude: 12 mV
Lead Channel Setting Pacing Amplitude: 2.5 V
Lead Channel Setting Pacing Amplitude: 3.625
Lead Channel Setting Pacing Pulse Width: 1.2 ms
Lead Channel Setting Sensing Sensitivity: 2 mV
MDC IDC MSMT LEADCHNL RA IMPEDANCE VALUE: 325 Ohm
MDC IDC MSMT LEADCHNL RV PACING THRESHOLD AMPLITUDE: 0.75 V
MDC IDC MSMT LEADCHNL RV PACING THRESHOLD AMPLITUDE: 0.75 V
MDC IDC SESS DTM: 20160712131837
MDC IDC SET LEADCHNL RV PACING PULSEWIDTH: 0.5 ms
Pulse Gen Serial Number: 7207729
Zone Setting Detection Interval: 270 ms
Zone Setting Detection Interval: 320 ms

## 2015-06-20 LAB — POCT INR: INR: 2.5

## 2015-06-20 NOTE — Patient Instructions (Addendum)
STOP THE ASPIRIN.  Remote monitoring is used to monitor your ICD from home. This monitoring reduces the number of office visits required to check your device to one time per year. It allows [place] to keep an eye on the functioning of your device to ensure it is working properly. You are scheduled for a device check from home on 09/19/2015. You may send your transmission at any time that day. If you have a wireless device, the transmission will be sent automatically. After your physician reviews your transmission, you will receive a postcard with your next transmission date.   Your physician recommends that you schedule a follow-up appointment in: 12 months with Dr.Croitoru  Your physician recommends that you return for lab work whenever fasting.(CMP/MAG./LIPID)

## 2015-06-20 NOTE — Progress Notes (Signed)
Patient ID: JI FELDNER, male   DOB: 1931-12-01, 81 y.o.   MRN: [id number]     Cardiology Office Note   Date:  06/20/2015   ID:  [name], DOB 21-May-1931, MRN [id number]  PCP:  [name], MD  Cardiologist:   Sanda Klein, MD   Chief Complaint  Patient presents with  . 10 months    Patient has no complaints.      History of Present Illness: JOSPH NORFLEET is a 81 y.o. male who presents for follow-up for non ischemic cardiomyopathy, chronic diastolic heart failure, complete heart block status post AV node ablation, history of severe systolic left ventricular dysfunction, recovered after arrhythmia control and biventricular pacing, CRT-D device follow-up  Mr. Brayman generally feels very well. He denies any complaints of dyspnea or chest pain either at rest or with exertion and has not expressed palpitations, syncope, lower extremity edema or other cardiac vascular complaints. He has easy bruising but otherwise no serious bleeding complications while on treatment with warfarin. He does not have a history of stroke/TIA or other embolic events.  Interrogation of his [employer]. Jude CRT-D unify assura device shows normal function with an estimated generator longevity of roughly 3 years. His left ventricular lead pacing threshold is relatively high, accounting for the rather short generator longevity (device implanted 2015). He has 93% biventricular pacing. He has permanent atrial fibrillation status post complete heart block and his lower pacing deficiency is secondary to very frequent PVCs. In fact on his 12-lead ECG today roughly 50% of the beats represented ectopic ventricular beats. He is unaware of the arrhythmia.  He had a fairly large ecchymosis following generator change out last September, but today the surgical site looks very healthy.  He has atrial fibrillation status post AV node ablation with a St. Jude Unify dual-chamber biventricular pacemaker/defibrillator  implanted in October 2010. The device is programmed VVIR secondary to permanent atrial fibrillation. Interrogation of the device today shows that it reached ERI on July 26. He has not received tachyarrhythmia therapies nor are there any episodes of VT/VF recorded. He is on chronic warfarin anticoagulation and I don't think he has ever had a stroke or TIA.  He had a myocardial infarction in 1999. He had severe mitral insufficiency. Cardiac catheterization in July 2005 showed a 70-80% stenosis of the LAD and 80-90% ostial diagonal stenosis. In August 2005 he underwent bypass surgery and mitral valve annuloplasty repair. He has moderate tricuspid insufficiency, mild to moderate aortic insufficiency. He had pulmonary arterial hypertension, not present on last echo. He carries a diagnosis of chronic compensated diastolic heart failure  His most recent echocardiogram in February 2014 showed an improved ejection fraction to 55% (at time of ICD implantation was 25% and had been 45% in September 2011) and "2-3+ (moderate to moderately severe)" mitral insufficiency. Mean transvalvular gradient was 2.1 mm Hg. He had 2+ aortic insufficiency and 3+ tricuspid insufficiency. Last echo calculated pulmonary artery pressure 28 mm Hg. Left atrium 6.1 cm, volume index 54 mL/meter square. LV end systolic diameter 3.7 cm  He has a history of systemic hypertension and mixed hyperlipidemia but has never smoked and does not have diabetes mellitus. She does not tolerate statins and is on Zetia monotherapy. The records available do not have any labs included.   Past Medical History  Diagnosis Date  . CHF (congestive heart failure)   . Coronary artery disease   . Hyperlipidemia   . Hypertension     Past Surgical History  Procedure Laterality Date  . Cardiac defibrillator placement  09/26/2009  . Mitral valve repair (mv)/coronary artery bypass grafting (cabg)    . Coronary artery bypass graft  2005  . Coronary artery  bypass graft    . Mitral valvuloplasty  2005  . Implantable cardioverter defibrillator (icd) generator change N/A 08/23/2014    Procedure: ICD GENERATOR CHANGE;  Surgeon: Sanda Klein, MD;  Location[facility] LAB;  Service: Cardiovascular;  Laterality: N/A;     Current Outpatient Prescriptions  Medication Sig Dispense Refill  . carvedilol (COREG) 3.125 MG tablet Take 1 tablet (3.125 mg total) by mouth 2 (two) times daily with a meal. 180 tablet 0  . ezetimibe (ZETIA) 10 MG tablet Take 10 mg by mouth daily.    . Glucos-Chondroit-Hyaluron-MSM (GLUCOSAMINE CHONDROITIN JOINT PO) Take 2 tablets by mouth daily. 1500 mg/ 1200 mg    . hydrochlorothiazide (HYDRODIURIL) 25 MG tablet Take 1 tablet (25 mg total) by mouth daily. 90 tablet 1  . HYDROcodone-acetaminophen (NORCO/VICODIN) 5-325 MG per tablet Take 1 tablet by mouth every 6 (six) hours as needed for moderate pain. 40 tablet 0  . lisinopril (PRINIVIL,ZESTRIL) 5 MG tablet Take 1 tablet (5 mg total) by mouth 2 (two) times daily. 180 tablet 0  . Multiple Vitamins-Minerals (CENTRUM SILVER PO) Take 1 tablet by mouth daily.    Marland Kitchen warfarin (COUMADIN) 5 MG tablet Take 1.5 tablets by mouth daily or as directed by coumadin clinic. 135 tablet 1   No current facility-administered medications for this visit.    Allergies:   Statins    Social History:  The patient  reports that he has never smoked. He does not have any smokeless tobacco history on file. He reports that he does not drink alcohol or use illicit drugs.   Family History:  The patient's family history includes Cancer in his father; Diabetes in his brother.    ROS:  Please see the history of present illness.    Otherwise, review of systems positive for none.   All other systems are reviewed and negative.    PHYSICAL EXAM: VS:  BP 102/60 mmHg  Pulse 83  Ht '5\' 11"'  (1.803 m)  Wt 177 lb (80.287 kg)  BMI 24.70 kg/m2 , BMI Body mass index is 24.7 kg/(m^2).  General: Alert, oriented x3, no  distress Head: no evidence of trauma, PERRL, EOMI, no exophtalmos or lid lag, no myxedema, no xanthelasma; normal ears, nose and oropharynx Neck: normal jugular venous pulsations and no hepatojugular reflux; brisk carotid pulses without delay and no carotid bruits Chest: clear to auscultation, no signs of consolidation by percussion or palpation, normal fremitus, symmetrical and full respiratory excursions Cardiovascular: normal position and quality of the apical impulse, regular rhythm with frequent ectopic beats, normal first and paradoxically split second heart sounds, 8-1/1 holosystolic murmur, left lower sternal border; no apical systolic murmur; no diastolic murmurs, rubs or gallops Abdomen: no tenderness or distention, no masses by palpation, no abnormal pulsatility or arterial bruits, normal bowel sounds, no hepatosplenomegaly Extremities: no clubbing, cyanosis or edema; 2+ radial, ulnar and brachial pulses bilaterally; 2+ right femoral, posterior tibial and dorsalis pedis pulses; 2+ left femoral, posterior tibial and dorsalis pedis pulses; no subclavian or femoral bruits Neurological: grossly nonfocal Psych: euthymic mood, full affect   EKG:  EKG is ordered today. The ekg ordered today demonstrates atrial fibrillation, ventricular paced beats with frequent PVCs and one ventricular couplet   Recent Labs: 08/17/2014: ALT 17; BUN 17; Creat 0.92; Hemoglobin 14.0; Platelets  140*; Potassium 4.3; Sodium 142    Lipid Panel    Component Value Date/Time   CHOL 161 08/17/2014 0810   TRIG 65 08/17/2014 0810   HDL 53 08/17/2014 0810   CHOLHDL 3.0 08/17/2014 0810   VLDL 13 08/17/2014 0810   LDLCALC 95 08/17/2014 0810      Wt Readings from Last 3 Encounters:  06/20/15 177 lb (80.287 kg)  02/28/15 183 lb (83.008 kg)  01/18/15 179 lb (81.194 kg)      Other studies Reviewed:  ASSESSMENT AND PLAN:  1. Chronic diastolic heart failure, well compensated, euvolemic despite no direct  therapy. LVEF 50-55 percent in September 2015 by echo  2. Complete heart block status post AV node ablation for intractable atrial fibrillation with rapid ventricular response; he is pacemaker dependent  3. Normal function of biventricular pacemaker/defibrillator other than relatively high pacing threshold on the left ventricular lead which limits device longevity. No episodes of ventricular sustained arrhythmia have been recorded, but he has very frequent PVCs, reducing biventricular pacing efficiency to only 93%. Despite this he has very well compensated heart failure.  4. Frequent PVCs ideally he would be on higher doses of beta blocker but the medication is limited by low blood pressure. Will check for electrolyte abnormalities secondary explain the increased arrhythmia burden. He does not appear to have any signs or symptoms of coronary insufficiency otherwise.  5. Permanent atrial fibrillation. There is no clear reason why he should take both aspirin and warfarin. We'll continue warfarin alone. INR was 2.5 today. Severely dilated left atrium.  6. Statin intolerant hyperlipidemia, on ezetimibe. Time to recheck a lipid profile.  7. CAD status post remote myocardial infarction 1999, status post coronary bypass surgery August 2005 without coronary event since that time  8. Status post mitral valve annuloplasty repair, mild residual regurgitation by echo  9. Moderate tricuspid insufficiency and moderate aortic insufficiency. No evidence of significant pulmonary artery hypertension. Tricuspid insufficiency may be related to the presence of 3 pacemaker leads. Neither valvular abnormality appears hemodynamically important at this time  Current medicines are reviewed at length with the patient today.  The patient does not have concerns regarding medicines.   Labs/ tests ordered today include:  Orders Placed This Encounter  Procedures  . Comp Met (CMET)  . Lipid Profile  . Magnesium  .  Implantable device check  . EKG 12-Lead      Patient Instructions  STOP THE ASPIRIN.  Remote monitoring is used to monitor your ICD from home. This monitoring reduces the number of office visits required to check your device to one time per year. It allows [place] to keep an eye on the functioning of your device to ensure it is working properly. You are scheduled for a device check from home on 09/19/2015. You may send your transmission at any time that day. If you have a wireless device, the transmission will be sent automatically. After your physician reviews your transmission, you will receive a postcard with your next transmission date.   Your physician recommends that you schedule a follow-up appointment in: 12 months with Dr.Mckinley Adelstein  Your physician recommends that you return for lab work whenever fasting.(CMP/MAG./LIPID)     Mikael Spray, MD  06/20/2015 4:28 PM    Sanda Klein, MD, [facility] [phone] office [phone] pager

## 2015-06-27 DIAGNOSIS — I10 Essential (primary) hypertension: Secondary | ICD-10-CM | POA: Diagnosis not present

## 2015-06-27 DIAGNOSIS — I493 Ventricular premature depolarization: Secondary | ICD-10-CM | POA: Diagnosis not present

## 2015-06-27 DIAGNOSIS — I4891 Unspecified atrial fibrillation: Secondary | ICD-10-CM | POA: Diagnosis not present

## 2015-06-27 DIAGNOSIS — E782 Mixed hyperlipidemia: Secondary | ICD-10-CM | POA: Diagnosis not present

## 2015-06-27 DIAGNOSIS — I482 Chronic atrial fibrillation: Secondary | ICD-10-CM | POA: Diagnosis not present

## 2015-06-27 LAB — COMPREHENSIVE METABOLIC PANEL
ALBUMIN: 3.8 g/dL (ref 3.5–5.2)
ALT: 15 U/L (ref 0–53)
AST: 21 U/L (ref 0–37)
Alkaline Phosphatase: 58 U/L (ref 39–117)
BUN: 16 mg/dL (ref 6–23)
CHLORIDE: 106 meq/L (ref 96–112)
CO2: 26 mEq/L (ref 19–32)
Calcium: 9.9 mg/dL (ref 8.4–10.5)
Creat: 0.84 mg/dL (ref 0.50–1.35)
GLUCOSE: 89 mg/dL (ref 70–99)
POTASSIUM: 4.3 meq/L (ref 3.5–5.3)
Sodium: 140 mEq/L (ref 135–145)
Total Bilirubin: 1.1 mg/dL (ref 0.2–1.2)
Total Protein: 6.3 g/dL (ref 6.0–8.3)

## 2015-06-27 LAB — LIPID PANEL
CHOL/HDL RATIO: 3.1 ratio
Cholesterol: 153 mg/dL (ref 0–200)
HDL: 50 mg/dL (ref >=40)
LDL Cholesterol: 92 mg/dL (ref 0–99)
Triglycerides: 57 mg/dL (ref <150)
VLDL: 11 mg/dL (ref 0–40)

## 2015-06-28 LAB — MAGNESIUM: MAGNESIUM: 2 mg/dL (ref 1.5–2.5)

## 2015-06-30 DIAGNOSIS — Z87891 Personal history of nicotine dependence: Secondary | ICD-10-CM | POA: Diagnosis not present

## 2015-06-30 DIAGNOSIS — J329 Chronic sinusitis, unspecified: Secondary | ICD-10-CM | POA: Diagnosis not present

## 2015-06-30 DIAGNOSIS — I252 Old myocardial infarction: Secondary | ICD-10-CM | POA: Diagnosis not present

## 2015-06-30 DIAGNOSIS — G8929 Other chronic pain: Secondary | ICD-10-CM | POA: Diagnosis not present

## 2015-06-30 DIAGNOSIS — I10 Essential (primary) hypertension: Secondary | ICD-10-CM | POA: Diagnosis not present

## 2015-06-30 DIAGNOSIS — R51 Headache: Secondary | ICD-10-CM | POA: Diagnosis not present

## 2015-06-30 DIAGNOSIS — E785 Hyperlipidemia, unspecified: Secondary | ICD-10-CM | POA: Diagnosis not present

## 2015-07-05 ENCOUNTER — Telehealth: Payer: Self-pay | Admitting: Internal Medicine

## 2015-07-05 NOTE — Telephone Encounter (Signed)
Patient Name: JAHMAI FINELLI  DOB: 1931/01/27    Initial Comment Caller states her father is having pressure in his ears. No discharge.    Nurse Assessment  Nurse: Julien Girt, RN, Almyra Free Date/Time Eilene Ghazi Time): 07/05/2015 11:51:53 AM  Confirm and document reason for call. If symptomatic, describe symptoms. ---Caller states she took her father to the ED Friday morning for c/o pressure in his head/.ears. He had a CT scan, thought to be sinus related, he was given Amoxicillin to take "if he developed a fever", which he started and OTC sinus meds. On Monday morning he seemed to feel better then last night advised her that the pressure had returned and he had actually vomited " and saw blood in it" He advised her that he was making himself throw up to relieve the pressure, but he also takes warfarin. She has not spoken to him this morning, I offered to conference but states he is very [place]. Caller stated she would go to his home and call me back.  Has the patient traveled out of the country within the last 30 days? ---Not Applicable  Does the patient require triage? ---Declined Triage  Please document clinical information provided and list any resource used. ---I will await call back for triage.    Nurse: Julien Girt, RN, Almyra Free Date/Time Eilene Ghazi Time): 07/05/2015 12:41:33 PM  Confirm and document reason for call. If symptomatic, describe symptoms. ---Caller states she is with her father now and he states he is "fine". Denies vomiting or spitting up blood, no headache, no ear pressure and he is feeling a lot better today. He is eating, drinking and urinating. Both parties decline triage or further assistance  Has the patient traveled out of the country within the last 30 days? ---Not Applicable  Does the patient require triage? ---Declined Triage  Please document clinical information provided and list any resource used. ---Advised to cb as needed. Caller and her father verbalized understanding.

## 2015-07-06 NOTE — Telephone Encounter (Signed)
Noted  

## 2015-07-08 ENCOUNTER — Other Ambulatory Visit: Payer: Self-pay | Admitting: Internal Medicine

## 2015-07-14 ENCOUNTER — Encounter: Payer: Self-pay | Admitting: Cardiovascular Disease

## 2015-07-20 DIAGNOSIS — H52223 Regular astigmatism, bilateral: Secondary | ICD-10-CM | POA: Diagnosis not present

## 2015-07-20 DIAGNOSIS — H2513 Age-related nuclear cataract, bilateral: Secondary | ICD-10-CM | POA: Diagnosis not present

## 2015-07-20 DIAGNOSIS — H5203 Hypermetropia, bilateral: Secondary | ICD-10-CM | POA: Diagnosis not present

## 2015-07-20 DIAGNOSIS — H35033 Hypertensive retinopathy, bilateral: Secondary | ICD-10-CM | POA: Diagnosis not present

## 2015-07-22 ENCOUNTER — Other Ambulatory Visit: Payer: Self-pay | Admitting: Internal Medicine

## 2015-08-01 ENCOUNTER — Telehealth: Payer: Self-pay | Admitting: Internal Medicine

## 2015-08-01 MED ORDER — HYDROCODONE-ACETAMINOPHEN 5-325 MG PO TABS: 1.0000 | ORAL_TABLET | Freq: Four times a day (QID) | ORAL | Status: DC | PRN

## 2015-08-01 MED ORDER — EZETIMIBE 10 MG PO TABS: 10.0000 mg | ORAL_TABLET | Freq: Every day | ORAL | Status: DC

## 2015-08-01 MED ORDER — EZETIMIBE 10 MG PO TABS
10.0000 mg | ORAL_TABLET | Freq: Every day | ORAL | Status: DC
Start: 2015-08-01 — End: 2016-07-22

## 2015-08-01 NOTE — Telephone Encounter (Signed)
Pt request refill HYDROcodone-acetaminophen (NORCO/VICODIN) 5-325 MG per tablet  Pt also needs a written RX for ezetimibe (ZETIA) 10 MG tablet  Pt gets this rx from [place]

## 2015-08-01 NOTE — Telephone Encounter (Signed)
[name] notified Rx's ready for pickup will be at the front desk. Rx's printed and signed.

## 2015-08-04 ENCOUNTER — Ambulatory Visit (INDEPENDENT_AMBULATORY_CARE_PROVIDER_SITE_OTHER): Payer: Medicare Other | Admitting: Pharmacist Clinician (PhC)/ Clinical Pharmacy Specialist

## 2015-08-04 DIAGNOSIS — Z7901 Long term (current) use of anticoagulants: Secondary | ICD-10-CM | POA: Diagnosis not present

## 2015-08-04 DIAGNOSIS — I482 Chronic atrial fibrillation: Secondary | ICD-10-CM | POA: Diagnosis not present

## 2015-08-04 DIAGNOSIS — I4891 Unspecified atrial fibrillation: Secondary | ICD-10-CM | POA: Diagnosis not present

## 2015-08-04 DIAGNOSIS — I4821 Permanent atrial fibrillation: Secondary | ICD-10-CM

## 2015-08-04 LAB — POCT INR: INR: 1.9

## 2015-08-22 DIAGNOSIS — Z85828 Personal history of other malignant neoplasm of skin: Secondary | ICD-10-CM | POA: Diagnosis not present

## 2015-08-22 DIAGNOSIS — L821 Other seborrheic keratosis: Secondary | ICD-10-CM | POA: Diagnosis not present

## 2015-08-22 DIAGNOSIS — L578 Other skin changes due to chronic exposure to nonionizing radiation: Secondary | ICD-10-CM | POA: Diagnosis not present

## 2015-08-22 DIAGNOSIS — Z08 Encounter for follow-up examination after completed treatment for malignant neoplasm: Secondary | ICD-10-CM | POA: Diagnosis not present

## 2015-09-01 ENCOUNTER — Ambulatory Visit (INDEPENDENT_AMBULATORY_CARE_PROVIDER_SITE_OTHER): Payer: Medicare Other | Admitting: Pharmacist Clinician (PhC)/ Clinical Pharmacy Specialist

## 2015-09-01 DIAGNOSIS — I4891 Unspecified atrial fibrillation: Secondary | ICD-10-CM | POA: Diagnosis not present

## 2015-09-01 DIAGNOSIS — I4821 Permanent atrial fibrillation: Secondary | ICD-10-CM

## 2015-09-01 DIAGNOSIS — I482 Chronic atrial fibrillation: Secondary | ICD-10-CM | POA: Diagnosis not present

## 2015-09-01 DIAGNOSIS — Z7901 Long term (current) use of anticoagulants: Secondary | ICD-10-CM

## 2015-09-01 LAB — POCT INR: INR: 1.8

## 2015-09-15 ENCOUNTER — Ambulatory Visit (INDEPENDENT_AMBULATORY_CARE_PROVIDER_SITE_OTHER): Payer: Medicare Other | Admitting: Pharmacist Clinician (PhC)/ Clinical Pharmacy Specialist

## 2015-09-15 DIAGNOSIS — I4821 Permanent atrial fibrillation: Secondary | ICD-10-CM

## 2015-09-15 DIAGNOSIS — I482 Chronic atrial fibrillation: Secondary | ICD-10-CM | POA: Diagnosis not present

## 2015-09-15 DIAGNOSIS — Z7901 Long term (current) use of anticoagulants: Secondary | ICD-10-CM | POA: Diagnosis not present

## 2015-09-15 DIAGNOSIS — I4891 Unspecified atrial fibrillation: Secondary | ICD-10-CM

## 2015-09-15 LAB — POCT INR: INR: 1.9

## 2015-09-19 ENCOUNTER — Encounter: Payer: Self-pay | Admitting: Cardiovascular Disease

## 2015-09-19 ENCOUNTER — Telehealth: Payer: Self-pay | Admitting: Cardiology

## 2015-09-19 ENCOUNTER — Ambulatory Visit (INDEPENDENT_AMBULATORY_CARE_PROVIDER_SITE_OTHER): Payer: Medicare Other | Admitting: *Deleted

## 2015-09-19 DIAGNOSIS — I4891 Unspecified atrial fibrillation: Secondary | ICD-10-CM | POA: Diagnosis not present

## 2015-09-19 NOTE — Telephone Encounter (Signed)
Confirmed remote transmission w/ pt wife.   

## 2015-09-19 NOTE — Progress Notes (Signed)
Remote ICD transmission.   

## 2015-09-22 LAB — CUP PACEART REMOTE DEVICE CHECK
Battery Remaining Longevity: 37 mo
Date Time Interrogation Session: 20161011152351
HIGH POWER IMPEDANCE MEASURED VALUE: 42 Ohm
HighPow Impedance: 42 Ohm
Implantable Lead Implant Date: 20101019
Implantable Lead Implant Date: 20101019
Implantable Lead Location: 753858
Implantable Lead Location: 753859
Implantable Lead Model: 1999
Lead Channel Impedance Value: 480 Ohm
Lead Channel Pacing Threshold Amplitude: 0.75 V
Lead Channel Pacing Threshold Amplitude: 2.125 V
Lead Channel Pacing Threshold Pulse Width: 1.2 ms
Lead Channel Setting Pacing Amplitude: 2.625
Lead Channel Setting Pacing Pulse Width: 0.5 ms
MDC IDC LEAD IMPLANT DT: 20101019
MDC IDC LEAD LOCATION: 753860
MDC IDC LEAD MODEL: 7121
MDC IDC MSMT BATTERY REMAINING PERCENTAGE: 72 %
MDC IDC MSMT BATTERY VOLTAGE: 2.93 V
MDC IDC MSMT LEADCHNL RV IMPEDANCE VALUE: 410 Ohm
MDC IDC MSMT LEADCHNL RV PACING THRESHOLD PULSEWIDTH: 0.5 ms
MDC IDC MSMT LEADCHNL RV SENSING INTR AMPL: 11.9 mV
MDC IDC PG SERIAL: 7207729
MDC IDC SET LEADCHNL LV PACING PULSEWIDTH: 1.2 ms
MDC IDC SET LEADCHNL RV PACING AMPLITUDE: 2.5 V
MDC IDC SET LEADCHNL RV SENSING SENSITIVITY: 2 mV
MDC IDC SET ZONE DETECTION INTERVAL: 320 ms
MDC IDC SET ZONE VENDOR TYPE: 773188
Zone Setting Detection Interval: 270 ms
Zone Setting Vendor Type Category: 773185

## 2015-09-28 ENCOUNTER — Encounter: Payer: Self-pay | Admitting: Cardiology

## 2015-09-29 ENCOUNTER — Ambulatory Visit (INDEPENDENT_AMBULATORY_CARE_PROVIDER_SITE_OTHER): Payer: Medicare Other | Admitting: Pharmacist Clinician (PhC)/ Clinical Pharmacy Specialist

## 2015-09-29 DIAGNOSIS — I4891 Unspecified atrial fibrillation: Secondary | ICD-10-CM

## 2015-09-29 DIAGNOSIS — I482 Chronic atrial fibrillation: Secondary | ICD-10-CM

## 2015-09-29 DIAGNOSIS — Z7901 Long term (current) use of anticoagulants: Secondary | ICD-10-CM | POA: Diagnosis not present

## 2015-09-29 DIAGNOSIS — I4821 Permanent atrial fibrillation: Secondary | ICD-10-CM

## 2015-09-29 LAB — POCT INR: INR: 2.5

## 2015-10-20 ENCOUNTER — Ambulatory Visit (INDEPENDENT_AMBULATORY_CARE_PROVIDER_SITE_OTHER): Payer: Medicare Other | Admitting: Pharmacist Clinician (PhC)/ Clinical Pharmacy Specialist

## 2015-10-20 DIAGNOSIS — I4891 Unspecified atrial fibrillation: Secondary | ICD-10-CM | POA: Diagnosis not present

## 2015-10-20 DIAGNOSIS — I482 Chronic atrial fibrillation: Secondary | ICD-10-CM

## 2015-10-20 DIAGNOSIS — Z7901 Long term (current) use of anticoagulants: Secondary | ICD-10-CM | POA: Diagnosis not present

## 2015-10-20 DIAGNOSIS — I4821 Permanent atrial fibrillation: Secondary | ICD-10-CM

## 2015-10-20 LAB — POCT INR: INR: 2.6

## 2015-10-23 DIAGNOSIS — M1711 Unilateral primary osteoarthritis, right knee: Secondary | ICD-10-CM | POA: Diagnosis not present

## 2015-10-23 DIAGNOSIS — M2241 Chondromalacia patellae, right knee: Secondary | ICD-10-CM | POA: Diagnosis not present

## 2015-11-16 ENCOUNTER — Telehealth: Payer: Self-pay | Admitting: Internal Medicine

## 2015-11-16 NOTE — Telephone Encounter (Signed)
Pt last office visit 02/28/15 Pt last Rx refill 08/01/15 #40  Please advise

## 2015-11-16 NOTE — Telephone Encounter (Signed)
Pt's daughter Grant Lawrence called saying he's completely out of Hydrocodone-Acetaminophen and wants to come pick up a Rx for it today. His other daughter, Grant Lawrence, will be the one to pick it up. Please let [name] know when it's ready and if you have questions or concerns.   Sarah's ph# [phone] Thank you.

## 2015-11-16 NOTE — Telephone Encounter (Signed)
Ok #[phone] 

## 2015-11-17 MED ORDER — HYDROCODONE-ACETAMINOPHEN 5-325 MG PO TABS: 1.0000 | ORAL_TABLET | Freq: Four times a day (QID) | ORAL | Status: DC | PRN

## 2015-11-17 NOTE — Telephone Encounter (Signed)
Rx is ready for pick up. Pt is aware  

## 2015-12-01 ENCOUNTER — Ambulatory Visit (INDEPENDENT_AMBULATORY_CARE_PROVIDER_SITE_OTHER): Payer: Medicare Other | Admitting: Pharmacist Clinician (PhC)/ Clinical Pharmacy Specialist

## 2015-12-01 DIAGNOSIS — I482 Chronic atrial fibrillation: Secondary | ICD-10-CM | POA: Diagnosis not present

## 2015-12-01 DIAGNOSIS — Z7901 Long term (current) use of anticoagulants: Secondary | ICD-10-CM

## 2015-12-01 DIAGNOSIS — I4821 Permanent atrial fibrillation: Secondary | ICD-10-CM

## 2015-12-01 DIAGNOSIS — I4891 Unspecified atrial fibrillation: Secondary | ICD-10-CM

## 2015-12-01 LAB — POCT INR: INR: 2.4

## 2015-12-09 ENCOUNTER — Other Ambulatory Visit: Payer: Self-pay | Admitting: Cardiovascular Disease

## 2015-12-09 ENCOUNTER — Other Ambulatory Visit: Payer: Self-pay | Admitting: Internal Medicine

## 2015-12-19 ENCOUNTER — Ambulatory Visit (INDEPENDENT_AMBULATORY_CARE_PROVIDER_SITE_OTHER): Payer: Medicare Other | Admitting: *Deleted

## 2015-12-19 DIAGNOSIS — I482 Chronic atrial fibrillation: Secondary | ICD-10-CM | POA: Diagnosis not present

## 2015-12-19 DIAGNOSIS — I4821 Permanent atrial fibrillation: Secondary | ICD-10-CM

## 2015-12-20 NOTE — Progress Notes (Signed)
Remote ICD transmission.   

## 2016-01-08 LAB — CUP PACEART REMOTE DEVICE CHECK
Battery Remaining Longevity: 35 mo
Battery Remaining Percentage: 68 %
Date Time Interrogation Session: 20170109192144
HIGH POWER IMPEDANCE MEASURED VALUE: 44 Ohm
HIGH POWER IMPEDANCE MEASURED VALUE: 44 Ohm
Implantable Lead Implant Date: 20101019
Implantable Lead Location: 753860
Lead Channel Impedance Value: 430 Ohm
Lead Channel Pacing Threshold Amplitude: 0.75 V
Lead Channel Pacing Threshold Pulse Width: 0.5 ms
Lead Channel Pacing Threshold Pulse Width: 1.2 ms
Lead Channel Sensing Intrinsic Amplitude: 12 mV
Lead Channel Setting Pacing Amplitude: 2.5 V
Lead Channel Setting Pacing Amplitude: 3.125
Lead Channel Setting Pacing Pulse Width: 1.2 ms
MDC IDC LEAD IMPLANT DT: 20101019
MDC IDC LEAD IMPLANT DT: 20101019
MDC IDC LEAD LOCATION: 753858
MDC IDC LEAD LOCATION: 753859
MDC IDC LEAD MODEL: 7121
MDC IDC MSMT BATTERY VOLTAGE: 2.93 V
MDC IDC MSMT LEADCHNL LV IMPEDANCE VALUE: 510 Ohm
MDC IDC MSMT LEADCHNL LV PACING THRESHOLD AMPLITUDE: 2.625 V
MDC IDC SET LEADCHNL RV PACING PULSEWIDTH: 0.5 ms
MDC IDC SET LEADCHNL RV SENSING SENSITIVITY: 2 mV
Pulse Gen Serial Number: 7207729

## 2016-01-10 ENCOUNTER — Encounter: Payer: Self-pay | Admitting: Cardiology

## 2016-01-12 ENCOUNTER — Ambulatory Visit (INDEPENDENT_AMBULATORY_CARE_PROVIDER_SITE_OTHER): Payer: Medicare Other | Admitting: Pharmacist Clinician (PhC)/ Clinical Pharmacy Specialist

## 2016-01-12 DIAGNOSIS — I4821 Permanent atrial fibrillation: Secondary | ICD-10-CM

## 2016-01-12 DIAGNOSIS — Z7901 Long term (current) use of anticoagulants: Secondary | ICD-10-CM

## 2016-01-12 DIAGNOSIS — I4891 Unspecified atrial fibrillation: Secondary | ICD-10-CM

## 2016-01-12 DIAGNOSIS — I482 Chronic atrial fibrillation: Secondary | ICD-10-CM | POA: Diagnosis not present

## 2016-01-12 LAB — POCT INR: INR: 2.4

## 2016-01-20 ENCOUNTER — Other Ambulatory Visit: Payer: Self-pay | Admitting: Internal Medicine

## 2016-02-23 ENCOUNTER — Ambulatory Visit (INDEPENDENT_AMBULATORY_CARE_PROVIDER_SITE_OTHER): Payer: Medicare Other | Admitting: Pharmacist Clinician (PhC)/ Clinical Pharmacy Specialist

## 2016-02-23 DIAGNOSIS — Z7901 Long term (current) use of anticoagulants: Secondary | ICD-10-CM | POA: Diagnosis not present

## 2016-02-23 DIAGNOSIS — I4891 Unspecified atrial fibrillation: Secondary | ICD-10-CM | POA: Diagnosis not present

## 2016-02-23 DIAGNOSIS — I4821 Permanent atrial fibrillation: Secondary | ICD-10-CM

## 2016-02-23 DIAGNOSIS — I482 Chronic atrial fibrillation: Secondary | ICD-10-CM

## 2016-02-23 LAB — POCT INR: INR: 1.8

## 2016-03-05 ENCOUNTER — Ambulatory Visit (INDEPENDENT_AMBULATORY_CARE_PROVIDER_SITE_OTHER): Payer: Medicare Other | Admitting: Internal Medicine

## 2016-03-05 ENCOUNTER — Encounter: Payer: Self-pay | Admitting: Internal Medicine

## 2016-03-05 VITALS — BP 110/60 | HR 70 | Temp 97.8°F | Resp 20 | Ht 71.0 in | Wt 179.0 lb

## 2016-03-05 DIAGNOSIS — Z4502 Encounter for adjustment and management of automatic implantable cardiac defibrillator: Secondary | ICD-10-CM | POA: Diagnosis not present

## 2016-03-05 DIAGNOSIS — I5032 Chronic diastolic (congestive) heart failure: Secondary | ICD-10-CM | POA: Diagnosis not present

## 2016-03-05 DIAGNOSIS — I10 Essential (primary) hypertension: Secondary | ICD-10-CM | POA: Diagnosis not present

## 2016-03-05 DIAGNOSIS — I4821 Permanent atrial fibrillation: Secondary | ICD-10-CM

## 2016-03-05 DIAGNOSIS — I2510 Atherosclerotic heart disease of native coronary artery without angina pectoris: Secondary | ICD-10-CM

## 2016-03-05 DIAGNOSIS — I482 Chronic atrial fibrillation: Secondary | ICD-10-CM

## 2016-03-05 MED ORDER — HYDROCODONE-ACETAMINOPHEN 5-325 MG PO TABS: 1.0000 | ORAL_TABLET | Freq: Four times a day (QID) | ORAL | Status: DC | PRN

## 2016-03-05 NOTE — Progress Notes (Signed)
Subjective:    Patient ID: Grant Lawrence, male    DOB: 10-22-1931, 82 y.o.   MRN: [id number]  HPI  82 year old patient who has a history of coronary artery disease.  He is status post CABG 2005.  He has permanent atrial fibrillation.  He has mitral valve disease and is status post annuloplasty 2005.  He is status post CRT-D.  He has essential hypertension and dyslipidemia. Complaints today include intermittent diarrhea alternating with constipation.  He has had some left shoulder pain and also pain in the upper leg area bilaterally.  He does use Vicodin when necessary.  Remains on Coumadin anticoagulation.  Past Medical History  Diagnosis Date  . CHF (congestive heart failure) ([place])   . Coronary artery disease   . Hyperlipidemia   . Hypertension     Social History   Social History  . Marital Status: Married    Spouse Name: N/A  . Number of Children: N/A  . Years of Education: N/A   Occupational History  . Not on file.   Social History Main Topics  . Smoking status: Never Smoker   . Smokeless tobacco: Not on file  . Alcohol Use: No  . Drug Use: No  . Sexual Activity: Not on file   Other Topics Concern  . Not on file   Social History Narrative    Past Surgical History  Procedure Laterality Date  . Cardiac defibrillator placement  09/26/2009  . Mitral valve repair (mv)/coronary artery bypass grafting (cabg)    . Coronary artery bypass graft  2005  . Coronary artery bypass graft    . Mitral valvuloplasty  2005  . Implantable cardioverter defibrillator (icd) generator change N/A 08/23/2014    Procedure: ICD GENERATOR CHANGE;  Surgeon: Sanda Klein, MD;  Location: [facility] CATH LAB;  Service: Cardiovascular;  Laterality: N/A;    Family History  Problem Relation Age of Onset  . Cancer Father   . Diabetes Brother     Allergies  Allergen Reactions  . Statins     Myalgia    Current Outpatient Prescriptions on File Prior to Visit  Medication Sig Dispense Refill    . amoxicillin (AMOXIL) 500 MG capsule Take 2,000 mg by mouth. For dental procedures    . carvedilol (COREG) 3.125 MG tablet TAKE ONE TABLET BY MOUTH TWICE DAILY WITH MEALS 180 tablet 3  . docusate sodium (COLACE) 100 MG capsule Take 100 mg by mouth daily.    Marland Kitchen ezetimibe (ZETIA) 10 MG tablet Take 1 tablet (10 mg total) by mouth daily. 90 tablet 3  . hydrochlorothiazide (HYDRODIURIL) 25 MG tablet TAKE ONE TABLET BY MOUTH ONCE DAILY 90 tablet 0  . lisinopril (PRINIVIL,ZESTRIL) 5 MG tablet TAKE ONE TABLET BY MOUTH TWICE DAILY 180 tablet 0  . Multiple Vitamins-Minerals (CENTRUM SILVER PO) Take 1 tablet by mouth daily.    Marland Kitchen warfarin (COUMADIN) 5 MG tablet TAKE ONE AND ONE-HALF TABLETS BY MOUTH ONCE DAILY OR AS DIRECTED BY COUMADIN CLINIC 135 tablet 1   No current facility-administered medications on file prior to visit.    BP 110/60 mmHg  Pulse 70  Temp(Src) 97.8 F (36.6 C) (Oral)  Resp 20  Ht 5\' 11"  (1.803 m)  Wt 179 lb (81.194 kg)  BMI 24.98 kg/m2  SpO2 98%     Review of Systems  Constitutional: Negative for fever, chills, appetite change and fatigue.  HENT: Negative for congestion, dental problem, ear pain, hearing loss, sore throat, tinnitus, trouble swallowing and voice change.  Eyes: Negative for pain, discharge and visual disturbance.  Respiratory: Negative for cough, chest tightness, wheezing and stridor.   Cardiovascular: Negative for chest pain, palpitations and leg swelling.  Gastrointestinal: Positive for diarrhea and constipation. Negative for nausea, vomiting, abdominal pain, blood in stool and abdominal distention.  Genitourinary: Negative for urgency, hematuria, flank pain, discharge, difficulty urinating and genital sores.  Musculoskeletal: Positive for myalgias and arthralgias. Negative for back pain, joint swelling, gait problem and neck stiffness.  Skin: Negative for rash.  Neurological: Negative for dizziness, syncope, speech difficulty, weakness, numbness and  headaches.  Hematological: Negative for adenopathy. Does not bruise/bleed easily.  Psychiatric/Behavioral: Negative for behavioral problems and dysphoric mood. The patient is not nervous/anxious.        Objective:   Physical Exam  Constitutional: He is oriented to person, place, and time. He appears well-developed.  Blood pressure low normal  HENT:  Head: Normocephalic.  Right Ear: External ear normal.  Left Ear: External ear normal.  Eyes: Conjunctivae and EOM are normal.  Neck: Normal range of motion.  Cardiovascular: Normal rate and normal heart sounds.   Pulmonary/Chest: Breath sounds normal.  Abdominal: Bowel sounds are normal.  Musculoskeletal: Normal range of motion. He exhibits no edema or tenderness.  Intact range of motion left shoulder.  No significant pain with movement  Range of motion both hips intact No localized tenderness over the anterior thighs  Neurological: He is alert and oriented to person, place, and time.  Psychiatric: He has a normal mood and affect. His behavior is normal.          Assessment & Plan:   Left shoulder pain Anterior thigh pain Chronic diastolic heart failure Permanent atrial fibrillation.  Continue Coumadin anticoagulation  Will continue symptomatic treatment with analgesics and observe Schedule annual exam Follow-up cardiology

## 2016-03-05 NOTE — Patient Instructions (Signed)
Limit your sodium (Salt) intake    It is important that you exercise regularly, at least 20 minutes 3 to 4 times per week.  If you develop chest pain or shortness of breath seek  medical attention.  Return in 4 months for follow-up  Call or return to clinic prn if these symptoms worsen or fail to improve as anticipated.

## 2016-03-05 NOTE — Progress Notes (Signed)
Pre visit review using our clinic review tool, if applicable. No additional management support is needed unless otherwise documented below in the visit note. 

## 2016-03-16 ENCOUNTER — Other Ambulatory Visit: Payer: Self-pay | Admitting: Internal Medicine

## 2016-03-19 ENCOUNTER — Telehealth: Payer: Self-pay | Admitting: Cardiology

## 2016-03-19 ENCOUNTER — Ambulatory Visit (INDEPENDENT_AMBULATORY_CARE_PROVIDER_SITE_OTHER): Payer: Medicare Other | Admitting: *Deleted

## 2016-03-19 DIAGNOSIS — I442 Atrioventricular block, complete: Secondary | ICD-10-CM

## 2016-03-19 NOTE — Telephone Encounter (Signed)
LMOVM reminding pt to send remote transmission.   

## 2016-03-20 NOTE — Progress Notes (Signed)
Remote ICD transmission.   

## 2016-03-22 ENCOUNTER — Ambulatory Visit (INDEPENDENT_AMBULATORY_CARE_PROVIDER_SITE_OTHER): Payer: Medicare Other | Admitting: Pharmacist Clinician (PhC)/ Clinical Pharmacy Specialist

## 2016-03-22 DIAGNOSIS — Z7901 Long term (current) use of anticoagulants: Secondary | ICD-10-CM | POA: Diagnosis not present

## 2016-03-22 DIAGNOSIS — I4891 Unspecified atrial fibrillation: Secondary | ICD-10-CM | POA: Diagnosis not present

## 2016-03-22 DIAGNOSIS — I482 Chronic atrial fibrillation: Secondary | ICD-10-CM

## 2016-03-22 DIAGNOSIS — I4821 Permanent atrial fibrillation: Secondary | ICD-10-CM

## 2016-03-22 LAB — POCT INR: INR: 2.2

## 2016-04-13 DIAGNOSIS — S0993XA Unspecified injury of face, initial encounter: Secondary | ICD-10-CM | POA: Diagnosis not present

## 2016-04-13 DIAGNOSIS — Z87891 Personal history of nicotine dependence: Secondary | ICD-10-CM | POA: Diagnosis not present

## 2016-04-13 DIAGNOSIS — I10 Essential (primary) hypertension: Secondary | ICD-10-CM | POA: Diagnosis not present

## 2016-04-13 DIAGNOSIS — S8000XA Contusion of unspecified knee, initial encounter: Secondary | ICD-10-CM | POA: Diagnosis not present

## 2016-04-13 DIAGNOSIS — Z23 Encounter for immunization: Secondary | ICD-10-CM | POA: Diagnosis not present

## 2016-04-13 DIAGNOSIS — S0181XA Laceration without foreign body of other part of head, initial encounter: Secondary | ICD-10-CM | POA: Diagnosis not present

## 2016-04-13 DIAGNOSIS — T07 Unspecified multiple injuries: Secondary | ICD-10-CM | POA: Diagnosis not present

## 2016-04-13 DIAGNOSIS — Z79899 Other long term (current) drug therapy: Secondary | ICD-10-CM | POA: Diagnosis not present

## 2016-04-13 DIAGNOSIS — S0990XA Unspecified injury of head, initial encounter: Secondary | ICD-10-CM | POA: Diagnosis not present

## 2016-04-13 DIAGNOSIS — S161XXA Strain of muscle, fascia and tendon at neck level, initial encounter: Secondary | ICD-10-CM | POA: Diagnosis not present

## 2016-04-13 DIAGNOSIS — S022XXA Fracture of nasal bones, initial encounter for closed fracture: Secondary | ICD-10-CM | POA: Diagnosis not present

## 2016-04-13 DIAGNOSIS — E785 Hyperlipidemia, unspecified: Secondary | ICD-10-CM | POA: Diagnosis not present

## 2016-04-13 DIAGNOSIS — M542 Cervicalgia: Secondary | ICD-10-CM | POA: Diagnosis not present

## 2016-04-13 DIAGNOSIS — S199XXA Unspecified injury of neck, initial encounter: Secondary | ICD-10-CM | POA: Diagnosis not present

## 2016-04-13 DIAGNOSIS — Z7901 Long term (current) use of anticoagulants: Secondary | ICD-10-CM | POA: Diagnosis not present

## 2016-04-13 DIAGNOSIS — I4891 Unspecified atrial fibrillation: Secondary | ICD-10-CM | POA: Diagnosis not present

## 2016-04-13 DIAGNOSIS — R51 Headache: Secondary | ICD-10-CM | POA: Diagnosis not present

## 2016-04-13 DIAGNOSIS — Z950 Presence of cardiac pacemaker: Secondary | ICD-10-CM | POA: Diagnosis not present

## 2016-04-13 DIAGNOSIS — T148 Other injury of unspecified body region: Secondary | ICD-10-CM | POA: Diagnosis not present

## 2016-04-13 DIAGNOSIS — S022XXB Fracture of nasal bones, initial encounter for open fracture: Secondary | ICD-10-CM | POA: Diagnosis not present

## 2016-04-14 ENCOUNTER — Other Ambulatory Visit: Payer: Self-pay | Admitting: Internal Medicine

## 2016-04-17 DIAGNOSIS — J342 Deviated nasal septum: Secondary | ICD-10-CM | POA: Diagnosis not present

## 2016-04-17 DIAGNOSIS — S022XXB Fracture of nasal bones, initial encounter for open fracture: Secondary | ICD-10-CM | POA: Diagnosis not present

## 2016-04-17 DIAGNOSIS — J329 Chronic sinusitis, unspecified: Secondary | ICD-10-CM | POA: Diagnosis not present

## 2016-04-18 ENCOUNTER — Ambulatory Visit: Payer: Medicare Other | Admitting: Adult Health

## 2016-04-19 DIAGNOSIS — S022XXB Fracture of nasal bones, initial encounter for open fracture: Secondary | ICD-10-CM | POA: Diagnosis not present

## 2016-04-29 LAB — CUP PACEART REMOTE DEVICE CHECK
Battery Remaining Longevity: 31 mo
HIGH POWER IMPEDANCE MEASURED VALUE: 43 Ohm
HighPow Impedance: 43 Ohm
Implantable Lead Implant Date: 20101019
Implantable Lead Implant Date: 20101019
Implantable Lead Location: 753858
Implantable Lead Location: 753859
Implantable Lead Model: 1999
Lead Channel Impedance Value: 400 Ohm
Lead Channel Impedance Value: 460 Ohm
Lead Channel Pacing Threshold Amplitude: 2.375 V
Lead Channel Sensing Intrinsic Amplitude: 11.4 mV
Lead Channel Setting Pacing Amplitude: 2.5 V
Lead Channel Setting Pacing Amplitude: 2.875
MDC IDC LEAD IMPLANT DT: 20101019
MDC IDC LEAD LOCATION: 753860
MDC IDC LEAD MODEL: 7121
MDC IDC MSMT BATTERY REMAINING PERCENTAGE: 62 %
MDC IDC MSMT BATTERY VOLTAGE: 2.92 V
MDC IDC MSMT LEADCHNL LV PACING THRESHOLD PULSEWIDTH: 1.2 ms
MDC IDC PG SERIAL: 7207729
MDC IDC SESS DTM: 20170411175951
MDC IDC SET LEADCHNL LV PACING PULSEWIDTH: 1.2 ms
MDC IDC SET LEADCHNL RV PACING PULSEWIDTH: 0.5 ms
MDC IDC SET LEADCHNL RV SENSING SENSITIVITY: 2 mV

## 2016-04-30 ENCOUNTER — Encounter: Payer: Self-pay | Admitting: Cardiology

## 2016-05-03 ENCOUNTER — Ambulatory Visit (INDEPENDENT_AMBULATORY_CARE_PROVIDER_SITE_OTHER): Payer: Medicare Other | Admitting: Pharmacist Clinician (PhC)/ Clinical Pharmacy Specialist

## 2016-05-03 DIAGNOSIS — I4891 Unspecified atrial fibrillation: Secondary | ICD-10-CM

## 2016-05-03 DIAGNOSIS — Z7901 Long term (current) use of anticoagulants: Secondary | ICD-10-CM

## 2016-05-03 DIAGNOSIS — I482 Chronic atrial fibrillation: Secondary | ICD-10-CM

## 2016-05-03 DIAGNOSIS — I4821 Permanent atrial fibrillation: Secondary | ICD-10-CM

## 2016-05-03 LAB — POCT INR: INR: 2.5

## 2016-06-14 ENCOUNTER — Ambulatory Visit (INDEPENDENT_AMBULATORY_CARE_PROVIDER_SITE_OTHER): Payer: Medicare Other | Admitting: Pharmacist Clinician (PhC)/ Clinical Pharmacy Specialist

## 2016-06-14 DIAGNOSIS — Z7901 Long term (current) use of anticoagulants: Secondary | ICD-10-CM | POA: Diagnosis not present

## 2016-06-14 DIAGNOSIS — I4821 Permanent atrial fibrillation: Secondary | ICD-10-CM

## 2016-06-14 DIAGNOSIS — I4891 Unspecified atrial fibrillation: Secondary | ICD-10-CM | POA: Diagnosis not present

## 2016-06-14 DIAGNOSIS — I482 Chronic atrial fibrillation: Secondary | ICD-10-CM

## 2016-06-14 LAB — POCT INR: INR: 2.6

## 2016-06-22 ENCOUNTER — Other Ambulatory Visit: Payer: Self-pay | Admitting: Cardiovascular Disease

## 2016-06-24 ENCOUNTER — Other Ambulatory Visit (INDEPENDENT_AMBULATORY_CARE_PROVIDER_SITE_OTHER): Payer: Medicare Other

## 2016-06-24 DIAGNOSIS — Z0000 Encounter for general adult medical examination without abnormal findings: Secondary | ICD-10-CM | POA: Diagnosis not present

## 2016-06-24 LAB — BASIC METABOLIC PANEL
BUN: 20 mg/dL (ref 6–23)
BUN: 20 mg/dL (ref 4–21)
CALCIUM: 10.1 mg/dL (ref 8.4–10.5)
CO2: 32 mEq/L (ref 19–32)
Chloride: 103 mEq/L (ref 96–112)
Creatinine, Ser: 0.83 mg/dL (ref 0.40–1.50)
Creatinine: 0.8 mg/dL (ref 0.6–1.3)
GFR: 93.96 mL/min (ref >60.00)
GLUCOSE: 89 mg/dL (ref 70–99)
Glucose: 89 mg/dL
POTASSIUM: 4.1 meq/L (ref 3.5–5.1)
POTASSIUM: 4.1 mmol/L (ref 3.4–5.3)
SODIUM: 140 meq/L (ref 135–145)
SODIUM: 140 mmol/L (ref 137–147)

## 2016-06-24 LAB — LIPID PANEL
CHOL/HDL RATIO: 3
Cholesterol: 172 mg/dL (ref 0–200)
Cholesterol: 172 mg/dL (ref 0–200)
HDL: 53 mg/dL (ref 35–70)
HDL: 53.2 mg/dL (ref >39.00)
LDL CALC: 106 mg/dL — AB (ref 0–99)
LDL Cholesterol: 106 mg/dL
LDL/HDL RATIO: 3
NONHDL: 118.59
Triglycerides: 62 mg/dL (ref 0.0–149.0)
Triglycerides: 62 mg/dL (ref 40–160)
VLDL: 12.4 mg/dL (ref 0.0–40.0)

## 2016-06-24 LAB — POC URINALSYSI DIPSTICK (AUTOMATED)
Bilirubin, UA: NEGATIVE
GLUCOSE UA: NEGATIVE
KETONES UA: NEGATIVE
LEUKOCYTES UA: NEGATIVE
Nitrite, UA: NEGATIVE
PROTEIN UA: NEGATIVE
Spec Grav, UA: 1.02
UROBILINOGEN UA: 1
pH, UA: 7

## 2016-06-24 LAB — PSA
PSA: 9.57 ng/mL — ABNORMAL HIGH (ref 0.10–4.00)
PSA: 9.57

## 2016-06-24 LAB — HEPATIC FUNCTION PANEL
ALK PHOS: 66 U/L (ref 39–117)
ALT: 15 U/L (ref 0–53)
ALT: 15 U/L (ref 10–40)
AST: 22 U/L (ref 0–37)
AST: 22 U/L (ref 14–40)
Albumin: 4.1 g/dL (ref 3.5–5.2)
Alkaline Phosphatase: 66 U/L (ref 25–125)
BILIRUBIN DIRECT: 0.2 mg/dL (ref 0.0–0.3)
BILIRUBIN TOTAL: 1 mg/dL (ref 0.2–1.2)
BILIRUBIN, TOTAL: 1 mg/dL
Bilirubin, Direct: 0.2 mg/dL (ref 0.01–0.4)
Total Protein: 6.4 g/dL (ref 6.0–8.3)

## 2016-06-24 LAB — TSH
TSH: 2.02 u[IU]/mL (ref 0.35–4.50)
TSH: 2.02 u[IU]/mL (ref 0.41–5.90)

## 2016-06-24 LAB — CBC AND DIFFERENTIAL
HCT: 39 % — AB (ref 41–53)
HEMOGLOBIN: 13.1 g/dL — AB (ref 13.5–17.5)
Platelets: 135 10*3/uL — AB (ref 150–399)
WBC: 5.5 10^3/mL

## 2016-06-25 LAB — CBC WITH DIFFERENTIAL/PLATELET
BASOS ABS: 0 10*3/uL (ref 0.0–0.1)
Basophils Relative: 0.5 % (ref 0.0–3.0)
EOS PCT: 2 % (ref 0.0–5.0)
Eosinophils Absolute: 0.1 10*3/uL (ref 0.0–0.7)
HCT: 38.6 % — ABNORMAL LOW (ref 39.0–52.0)
HEMOGLOBIN: 13.1 g/dL (ref 13.0–17.0)
LYMPHS ABS: 1.6 10*3/uL (ref 0.7–4.0)
Lymphocytes Relative: 28.5 % (ref 12.0–46.0)
MCHC: 34 g/dL (ref 30.0–36.0)
MCV: 97.2 fl (ref 78.0–100.0)
MONO ABS: 0.1 10*3/uL (ref 0.1–1.0)
Monocytes Relative: 2.5 % — ABNORMAL LOW (ref 3.0–12.0)
NEUTROS PCT: 66.5 % (ref 43.0–77.0)
Neutro Abs: 3.7 10*3/uL (ref 1.4–7.7)
Platelets: 135 10*3/uL — ABNORMAL LOW (ref 150.0–400.0)
RBC: 3.97 Mil/uL — AB (ref 4.22–5.81)
RDW: 13.3 % (ref 11.5–15.5)
WBC: 5.5 10*3/uL (ref 4.0–10.5)

## 2016-07-01 NOTE — Progress Notes (Signed)
Cardiology Office Note    Date:  07/09/2016   ID:  ALEXANDE Lawrence, DOB 07-Dec-1931, MRN [id number]  PCP:  [name], MD  Cardiologist:   Sanda Klein, MD   Chief Complaint  Patient presents with  . Follow-up    History of Present Illness:  Grant Lawrence is a 82 y.o. male here in follow up for non ischemic cardiomyopathy, chronic diastolic heart failure, complete heart block status post AV node ablation, permanent atrial fibrillation, history of severe systolic left ventricular dysfunction, recovered after arrhythmia control and biventricular pacing, here for CRT-D device follow-up  Grant Lawrence generally feels very well. He denies any complaints of dyspnea or chest pain either at rest or with exertion and has not expressed palpitations, syncope, lower extremity edema or other cardiac vascular complaints. He has easy bruising but otherwise no serious bleeding complications while on treatment with warfarin. He does not have a history of stroke/TIA or other embolic events.  Interrogation of his St. Jude CRT-D unify assura device shows normal function. His left ventricular lead pacing threshold is relatively high, accounting for the rather short generator longevity (device implanted 2015). estimated to reach ERI in 2.2 years. There is no underlying ventricular escape rhythm when pacing is taken down to 30 bpm. He has 92% biventricular pacing. He has permanent atrial fibrillation status post complete heart block and his lower pacing deficiency is secondary to very frequent PVCs. He is unaware of the arrhythmia.  He has atrial fibrillation status post AV node ablation with a St. Jude Unify dual-chamber biventricular pacemaker/defibrillator implanted in October 2010, generator changeout in 2015. The device is programmed VVIR secondary to permanent atrial fibrillation. He has not received tachyarrhythmia therapies nor are there any episodes of VT/VF recorded. He is on chronic  warfarin anticoagulation and I don't think he has ever had a stroke or TIA.  He had a myocardial infarction in 1999. He had severe mitral insufficiency. Cardiac catheterization in July 2005 showed a 70-80% stenosis of the LAD and 80-90% ostial diagonal stenosis. In August 2005 he underwent bypass surgery and mitral valve annuloplasty repair. He has moderate tricuspid insufficiency, mild to moderate aortic insufficiency. He had pulmonary arterial hypertension, not present on last echo. He carries a diagnosis of chronic compensated diastolic heart failure  His most recent echocardiogram in September 2015 showed an improved ejection fraction to 50-55% (at time of ICD implantation was 25% and had been 45% in September 2011, 55% in 2014) and mild mitral insufficiency. Mean transvalvular gradient was 2.1 mm Hg. He had moderate aortic insufficiency and tricuspid insufficiency. Left atrium 5.6 cm. LV end systolic diameter 3.6 cm. sPAP 28 mm Hg.  He has a history of systemic hypertension and mixed hyperlipidemia but has never smoked and does not have diabetes mellitus. She does not tolerate statins and is on Zetia monotherapy. The records available do not have any labs included.   Past Medical History:  Diagnosis Date  . CHF (congestive heart failure) ([place])   . Coronary artery disease   . Hyperlipidemia   . Hypertension     Past Surgical History:  Procedure Laterality Date  . CARDIAC DEFIBRILLATOR PLACEMENT  09/26/2009  . CORONARY ARTERY BYPASS GRAFT  2005  . CORONARY ARTERY BYPASS GRAFT    . IMPLANTABLE CARDIOVERTER DEFIBRILLATOR (ICD) GENERATOR CHANGE N/A 08/23/2014   Procedure: ICD GENERATOR CHANGE;  Surgeon: Sanda Klein, MD;  Location: [facility];  Service: Cardiovascular;  Laterality: N/A;  . MITRAL VALVE REPAIR (MV)/CORONARY ARTERY  BYPASS GRAFTING (CABG)    . MITRAL VALVULOPLASTY  2005    Current Medications: Outpatient Medications Prior to Visit  Medication Sig Dispense Refill  .  amoxicillin (AMOXIL) 500 MG capsule Take 2,000 mg by mouth. For dental procedures    . carvedilol (COREG) 3.125 MG tablet TAKE ONE TABLET BY MOUTH TWICE DAILY WITH MEALS 180 tablet 3  . ezetimibe (ZETIA) 10 MG tablet Take 1 tablet (10 mg total) by mouth daily. 90 tablet 3  . hydrochlorothiazide (HYDRODIURIL) 25 MG tablet Take 1 tablet (25 mg total) by mouth daily. 90 tablet 1  . HYDROcodone-acetaminophen (NORCO/VICODIN) 5-325 MG tablet Take 1 tablet by mouth every 6 (six) hours as needed for moderate pain. 60 tablet 0  . lisinopril (PRINIVIL,ZESTRIL) 5 MG tablet TAKE ONE TABLET BY MOUTH TWICE DAILY 180 tablet 0  . Multiple Vitamins-Minerals (CENTRUM SILVER PO) Take 1 tablet by mouth daily.    Marland Kitchen warfarin (COUMADIN) 5 MG tablet TAKE 1 AND 1/2 TABLET BY MOUTH ONCE DAILY OR AS DIRECTED BY THE COUMADIN CLINIC 135 tablet 1  . docusate sodium (COLACE) 100 MG capsule Take 100 mg by mouth daily.     No facility-administered medications prior to visit.      Allergies:   Rosuvastatin calcium and Statins   Social History   Social History  . Marital status: Married    Spouse name: N/A  . Number of children: N/A  . Years of education: N/A   Social History Main Topics  . Smoking status: Never Smoker  . Smokeless tobacco: None  . Alcohol use No  . Drug use: No  . Sexual activity: Not Asked   Other Topics Concern  . None   Social History Narrative  . None     Family History:  The patient's family history includes Cancer in his father; Diabetes in his brother.   ROS:   Please see the history of present illness.    ROS All other systems reviewed and are negative.   PHYSICAL EXAM:   VS:  BP 120/74 (BP Location: Right Arm, Patient Position: Sitting, Cuff Size: Normal)   Pulse 70   Ht 5\' 11"  (1.803 m)   Wt 78.9 kg (174 lb)   BMI 24.27 kg/m    GEN: Well nourished, well developed, in no acute distress  HEENT: normal  Neck: no JVD, carotid bruits, or masses Cardiac: Paradoxically split  second heart sound RRR; no murmurs, rubs, or gallops,no edema , healthy subclavian pacemaker site Respiratory:  clear to auscultation bilaterally, normal work of breathing GI: soft, nontender, nondistended, + BS MS: no deformity or atrophy  Skin: warm and dry, no rash Neuro:  Alert and Oriented x 3, Strength and sensation are intact Psych: euthymic mood, full affect  Wt Readings from Last 3 Encounters:  07/09/16 78.9 kg (174 lb)  03/05/16 81.2 kg (179 lb)  06/20/15 80.3 kg (177 lb)      Studies/Labs Reviewed:   EKG:  EKG is ordered today.  The ekg ordered today demonstrates Atrial fibrillation, biventricular pacing at 70 bpm, QTC 475 ms  Recent Labs: 06/24/2016: ALT 15; BUN 20; Creatinine, Ser 0.83; Hemoglobin 13.1; Platelets 135.0; Potassium 4.1; Sodium 140; TSH 2.02   Lipid Panel    Component Value Date/Time   CHOL 172 06/24/2016 0823   TRIG 62.0 06/24/2016 0823   HDL 53.20 06/24/2016 0823   CHOLHDL 3 06/24/2016 0823   VLDL 12.4 06/24/2016 0823   LDLCALC 106 (H) 06/24/2016 0823    ASSESSMENT:  1. Chronic diastolic heart failure ([place])   2. CHB (complete heart block) s/p AV node ablation   3. s/p CRT-D St Jude 2010,gen change Sept 2015   4. PVCs (premature ventricular contractions)   5. Permanent atrial fibrillation ([place])   6. Mixed hyperlipidemia   7. Statin intolerance   8. Coronary artery disease involving native coronary artery of native heart without angina pectoris   9. History of repair of mitral valve   10. Tricuspid insufficiency   11. Aortic insufficiency   12. Essential hypertension      PLAN:  In order of problems listed above:  1. Chronic diastolic heart failure, well compensated, euvolemic despite no direct therapy. LVEF 50-55 % in September 2015 by echo. 2. CHB s/p AV node ablation for intractable atrial fibrillation with rapid ventricular response; he is pacemaker dependent. 3. CRT-D: other than relatively high pacing threshold on the left  ventricular lead which limits device longevity. No episodes of ventricular sustained arrhythmia have been recorded, but he has very frequent PVCs, reducing biventricular pacing efficiency to only 92%. Despite this he has very well compensated heart failure. 4. Frequent PVCs ideally he would be on higher doses of beta blocker but the medication has been limited by low blood pressure. 5. Permanent atrial fibrillation. CHADSVasc 4 (age 77, HF, CAD). Severely dilated left atrium. 6. Hyperlipidemia, on ezetimibe. I suggested a 4-6 week period of "holiday" from his lipid-lowering agent to see if his muscle cramps get better. I think it is likely that his muscle cramps are due to relative hypovolemia. He drinks a lot of tea and does not drink water throughout the day. Encouraged to drink more water. 7. Statin intolerant: Borderline acceptable LDL level on current regimen 8. CAD status post remote myocardial infarction 1999, status post coronary bypass surgery August 2005 without coronary event since that time. 9. S/P mitral valve annuloplasty repair, mild residual regurgitation by echo. 10. HTN: Blood pressure is normal. Recommend decreasing hydrochlorothiazide to 12.5 mg daily 11. Moderate tricuspid insufficiency and moderate aortic insufficiency. No evidence of significant pulmonary artery hypertension. Tricuspid insufficiency may be related to the presence of 3 pacemaker leads. Neither valvular abnormality appears hemodynamically important at this time   Medication Adjustments/Labs and Tests Ordered: Current medicines are reviewed at length with the patient today.  Concerns regarding medicines are outlined above.  Medication changes, Labs and Tests ordered today are listed in the Patient Instructions below. Patient Instructions  Dr Sallyanne Kuster has recommended making the following medication changes: 1. HOLD Zetia for 4-6 weeks  >>Call back in 4-6 weeks and let us know how you are feeling  Remote monitoring  is used to monitor your Pacemaker of ICD from home. This monitoring reduces the number of office visits required to check your device to one time per year. It allows [place] to keep an eye on the functioning of your device to ensure it is working properly. You are scheduled for a device check from home on Tuesday, October 31st, 2017. You may send your transmission at any time that day. If you have a wireless device, the transmission will be sent automatically. After your physician reviews your transmission, you will receive a postcard with your next transmission date.  DRINK MORE WATER!!  Dr Sallyanne Kuster recommends that you schedule a follow-up appointment in 6 months with a defibrillator check. You will receive a reminder letter in the mail two months in advance. If you don't receive a letter, please call our office to schedule the follow-up  appointment.  If you need a refill on your cardiac medications before your next appointment, please call your pharmacy.    Signed, Sanda Klein, MD  07/09/2016 2:33 PM    [address], [place], [place]  [address] Phone: [phone]; Fax: [phone]

## 2016-07-03 DIAGNOSIS — Z9889 Other specified postprocedural states: Secondary | ICD-10-CM | POA: Insufficient documentation

## 2016-07-03 DIAGNOSIS — I493 Ventricular premature depolarization: Secondary | ICD-10-CM | POA: Insufficient documentation

## 2016-07-05 ENCOUNTER — Encounter: Payer: Medicare Other | Admitting: Internal Medicine

## 2016-07-09 ENCOUNTER — Ambulatory Visit (INDEPENDENT_AMBULATORY_CARE_PROVIDER_SITE_OTHER): Payer: Medicare Other | Admitting: Cardiovascular Disease

## 2016-07-09 ENCOUNTER — Ambulatory Visit (INDEPENDENT_AMBULATORY_CARE_PROVIDER_SITE_OTHER): Payer: Medicare Other | Admitting: Pharmacist

## 2016-07-09 ENCOUNTER — Encounter: Payer: Self-pay | Admitting: Cardiovascular Disease

## 2016-07-09 VITALS — BP 120/74 | HR 70 | Ht 71.0 in | Wt 174.0 lb

## 2016-07-09 DIAGNOSIS — I482 Chronic atrial fibrillation: Secondary | ICD-10-CM | POA: Diagnosis not present

## 2016-07-09 DIAGNOSIS — Z7901 Long term (current) use of anticoagulants: Secondary | ICD-10-CM | POA: Diagnosis not present

## 2016-07-09 DIAGNOSIS — I493 Ventricular premature depolarization: Secondary | ICD-10-CM | POA: Diagnosis not present

## 2016-07-09 DIAGNOSIS — Z95810 Presence of automatic (implantable) cardiac defibrillator: Secondary | ICD-10-CM

## 2016-07-09 DIAGNOSIS — I4821 Permanent atrial fibrillation: Secondary | ICD-10-CM

## 2016-07-09 DIAGNOSIS — E782 Mixed hyperlipidemia: Secondary | ICD-10-CM

## 2016-07-09 DIAGNOSIS — I2510 Atherosclerotic heart disease of native coronary artery without angina pectoris: Secondary | ICD-10-CM

## 2016-07-09 DIAGNOSIS — I5032 Chronic diastolic (congestive) heart failure: Secondary | ICD-10-CM

## 2016-07-09 DIAGNOSIS — Z9889 Other specified postprocedural states: Secondary | ICD-10-CM

## 2016-07-09 DIAGNOSIS — I442 Atrioventricular block, complete: Secondary | ICD-10-CM | POA: Diagnosis not present

## 2016-07-09 DIAGNOSIS — I4891 Unspecified atrial fibrillation: Secondary | ICD-10-CM

## 2016-07-09 DIAGNOSIS — Z889 Allergy status to unspecified drugs, medicaments and biological substances status: Secondary | ICD-10-CM

## 2016-07-09 DIAGNOSIS — Z789 Other specified health status: Secondary | ICD-10-CM

## 2016-07-09 DIAGNOSIS — I10 Essential (primary) hypertension: Secondary | ICD-10-CM

## 2016-07-09 DIAGNOSIS — I351 Nonrheumatic aortic (valve) insufficiency: Secondary | ICD-10-CM

## 2016-07-09 DIAGNOSIS — I071 Rheumatic tricuspid insufficiency: Secondary | ICD-10-CM

## 2016-07-09 LAB — CUP PACEART INCLINIC DEVICE CHECK
Battery Remaining Longevity: 26.4
Date Time Interrogation Session: 20170801130933
HighPow Impedance: 41.1231
Implantable Lead Implant Date: 20101019
Implantable Lead Implant Date: 20101019
Implantable Lead Location: 753858
Implantable Lead Location: 753859
Implantable Lead Model: 1999
Lead Channel Impedance Value: 312.5 Ohm
Lead Channel Impedance Value: 400 Ohm
Lead Channel Pacing Threshold Amplitude: 0.75 V
Lead Channel Pacing Threshold Amplitude: 3.25 V
Lead Channel Pacing Threshold Pulse Width: 0.5 ms
Lead Channel Pacing Threshold Pulse Width: 1.2 ms
Lead Channel Pacing Threshold Pulse Width: 1.2 ms
Lead Channel Sensing Intrinsic Amplitude: 11.4 mV
Lead Channel Setting Pacing Amplitude: 2.5 V
MDC IDC LEAD IMPLANT DT: 20101019
MDC IDC LEAD LOCATION: 753860
MDC IDC LEAD MODEL: 7121
MDC IDC MSMT LEADCHNL LV IMPEDANCE VALUE: 450 Ohm
MDC IDC MSMT LEADCHNL LV PACING THRESHOLD AMPLITUDE: 3.25 V
MDC IDC MSMT LEADCHNL RV PACING THRESHOLD AMPLITUDE: 0.75 V
MDC IDC MSMT LEADCHNL RV PACING THRESHOLD PULSEWIDTH: 0.5 ms
MDC IDC PG SERIAL: 7207729
MDC IDC SET LEADCHNL LV PACING AMPLITUDE: 3.5 V
MDC IDC SET LEADCHNL LV PACING PULSEWIDTH: 1.2 ms
MDC IDC SET LEADCHNL RV PACING PULSEWIDTH: 0.5 ms
MDC IDC SET LEADCHNL RV SENSING SENSITIVITY: 2 mV
MDC IDC STAT BRADY RA PERCENT PACED: 0 %
MDC IDC STAT BRADY RV PERCENT PACED: 92 %

## 2016-07-09 LAB — POCT INR: INR: 2.9

## 2016-07-09 NOTE — Patient Instructions (Signed)
Dr Sallyanne Kuster has recommended making the following medication changes: 1. HOLD Zetia for 4-6 weeks  >>Call back in 4-6 weeks and let us know how you are feeling  Remote monitoring is used to monitor your Pacemaker of ICD from home. This monitoring reduces the number of office visits required to check your device to one time per year. It allows [place] to keep an eye on the functioning of your device to ensure it is working properly. You are scheduled for a device check from home on Tuesday, October 31st, 2017. You may send your transmission at any time that day. If you have a wireless device, the transmission will be sent automatically. After your physician reviews your transmission, you will receive a postcard with your next transmission date.  DRINK MORE WATER!!  Dr Sallyanne Kuster recommends that you schedule a follow-up appointment in 6 months with a defibrillator check. You will receive a reminder letter in the mail two months in advance. If you don't receive a letter, please call our office to schedule the follow-up appointment.  If you need a refill on your cardiac medications before your next appointment, please call your pharmacy.

## 2016-07-10 ENCOUNTER — Telehealth: Payer: Self-pay | Admitting: Cardiovascular Disease

## 2016-07-10 NOTE — Telephone Encounter (Signed)
Dionne Bucy Truitt, CMA  Fidel Levy, RN        Please, call patient.  Dr C recommends that he decrease his HCTZ in half. This may help with the leg cramps he's been having.    Spoke with patient's wife Arbie Cookey and communicated this information. She will inform patient.   Med list updated.   Ref: MD office note from 07/09/16 - Plan: #[phone]

## 2016-07-20 ENCOUNTER — Other Ambulatory Visit: Payer: Self-pay | Admitting: Internal Medicine

## 2016-07-22 ENCOUNTER — Ambulatory Visit (INDEPENDENT_AMBULATORY_CARE_PROVIDER_SITE_OTHER): Payer: Medicare Other | Admitting: Internal Medicine

## 2016-07-22 ENCOUNTER — Encounter: Payer: Self-pay | Admitting: Internal Medicine

## 2016-07-22 VITALS — BP 110/70 | HR 60 | Temp 97.6°F | Ht 69.0 in | Wt 176.0 lb

## 2016-07-22 DIAGNOSIS — I5032 Chronic diastolic (congestive) heart failure: Secondary | ICD-10-CM | POA: Diagnosis not present

## 2016-07-22 DIAGNOSIS — R972 Elevated prostate specific antigen [PSA]: Secondary | ICD-10-CM | POA: Diagnosis not present

## 2016-07-22 DIAGNOSIS — Z0000 Encounter for general adult medical examination without abnormal findings: Secondary | ICD-10-CM

## 2016-07-22 DIAGNOSIS — I2510 Atherosclerotic heart disease of native coronary artery without angina pectoris: Secondary | ICD-10-CM

## 2016-07-22 DIAGNOSIS — I2583 Coronary atherosclerosis due to lipid rich plaque: Secondary | ICD-10-CM

## 2016-07-22 DIAGNOSIS — N400 Enlarged prostate without lower urinary tract symptoms: Secondary | ICD-10-CM | POA: Diagnosis not present

## 2016-07-22 MED ORDER — EZETIMIBE 10 MG PO TABS: 10.0000 mg | ORAL_TABLET | Freq: Every day | ORAL | 3 refills | Status: DC

## 2016-07-22 NOTE — Progress Notes (Signed)
Pre visit review using our clinic review tool, if applicable. No additional management support is needed unless otherwise documented below in the visit note. 

## 2016-07-22 NOTE — Patient Instructions (Signed)
Limit your sodium (Salt) intake  Return in 6 months for follow-up  Cardiology follow-up as scheduled    It is important that you exercise regularly, at least 20 minutes 3 to 4 times per week.  If you develop chest pain or shortness of breath seek  medical attention.

## 2016-07-22 NOTE — Progress Notes (Signed)
Subjective:    Patient ID: Grant Lawrence, male    DOB: 1931/07/18, 82 y.o.   MRN: [id number]  HPI 48 -year-old patient who is seen today for an annual preventive health examination.  He has a complex cardiac history and is followed closely by cardiology.  He has a history mild dyslipidemia and statin intolerance.  He has essential hypertension.  In general doing fairly well.  His cardiac status has been stable   Past Medical History:  Diagnosis Date  . CHF (congestive heart failure) ([place])   . Coronary artery disease   . Hyperlipidemia   . Hypertension     Social History   Social History  . Marital status: Married    Spouse name: N/A  . Number of children: N/A  . Years of education: N/A   Occupational History  . Not on file.   Social History Main Topics  . Smoking status: Never Smoker  . Smokeless tobacco: Not on file  . Alcohol use No  . Drug use: No  . Sexual activity: Not on file   Other Topics Concern  . Not on file   Social History Narrative  . No narrative on file    Past Surgical History:  Procedure Laterality Date  . CARDIAC DEFIBRILLATOR PLACEMENT  09/26/2009  . CORONARY ARTERY BYPASS GRAFT  2005  . CORONARY ARTERY BYPASS GRAFT    . IMPLANTABLE CARDIOVERTER DEFIBRILLATOR (ICD) GENERATOR CHANGE N/A 08/23/2014   Procedure: ICD GENERATOR CHANGE;  Surgeon: Sanda Klein, MD;  Location: [facility];  Service: Cardiovascular;  Laterality: N/A;  . MITRAL VALVE REPAIR (MV)/CORONARY ARTERY BYPASS GRAFTING (CABG)    . MITRAL VALVULOPLASTY  2005    Family History  Problem Relation Age of Onset  . Cancer Father   . Diabetes Brother     Allergies  Allergen Reactions  . Rosuvastatin Calcium     Myalgia  . Statins     Myalgia    Current Outpatient Prescriptions on File Prior to Visit  Medication Sig Dispense Refill  . amoxicillin (AMOXIL) 500 MG capsule Take 2,000 mg by mouth. For dental procedures    . carvedilol (COREG) 3.125 MG tablet TAKE ONE  TABLET BY MOUTH TWICE DAILY WITH MEALS 180 tablet 3  . ezetimibe (ZETIA) 10 MG tablet Take 1 tablet (10 mg total) by mouth daily. 90 tablet 3  . hydrochlorothiazide (HYDRODIURIL) 25 MG tablet Take 12.5 mg by mouth daily.    Marland Kitchen HYDROcodone-acetaminophen (NORCO/VICODIN) 5-325 MG tablet Take 1 tablet by mouth every 6 (six) hours as needed for moderate pain. 60 tablet 0  . lisinopril (PRINIVIL,ZESTRIL) 5 MG tablet TAKE ONE TABLET BY MOUTH TWICE DAILY 180 tablet 0  . Multiple Vitamins-Minerals (CENTRUM SILVER PO) Take 1 tablet by mouth daily.    Marland Kitchen warfarin (COUMADIN) 5 MG tablet TAKE 1 AND 1/2 TABLET BY MOUTH ONCE DAILY OR AS DIRECTED BY THE COUMADIN CLINIC 135 tablet 1   No current facility-administered medications on file prior to visit.     Temp 97.6 F (36.4 C) (Oral)   Ht 5\' 9"  (1.753 m)   Wt 176 lb (79.8 kg)   BMI 25.99 kg/m   Medicare wellness exam  1. Risk factors, based on past  M,S,F history.  Patient has known coronary artery disease.  Risk factors include hypertension and dyslipidemia  2.  Physical activities: Walks almost daily.  No exercise limitations   3.  Depression/mood: No history of major depression  4.  Hearing: Moderate deficits  5.  ADL's: Independent  6.  Fall risk: Moderate.  Patient has had one fall over the past 3 months after tripping on a curb  7.  Home safety: No problems identified  8.  Height weight, and visual acuity; height and weight stable no change in visual acuity  9.  Counseling: Continue heart healthy diet and cardiology follow-up  10. Lab orders based on risk factors: Laboratory studies were reviewed  11. Referral : Follow-up cardiology.  Urological referral.  Also discussed due to elevated PSA  12. Care plan: Recheck PSA in one year.  Continue heart healthy diet and aggressive risk factor modification  13. Cognitive assessment: Alert in order with normal affect no cognitive dysfunction  14. Screening: Patient provided with a written  and personalized 5-10 year screening schedule in the AVS.    15. Provider List Update: Cardiology and primary care medicine as well as ophthalmology    Review of Systems  Constitutional: Negative for appetite change, chills, fatigue and fever.  HENT: Negative for congestion, dental problem, ear pain, hearing loss, sore throat, tinnitus, trouble swallowing and voice change.   Eyes: Negative for pain, discharge and visual disturbance.  Respiratory: Negative for cough, chest tightness, wheezing and stridor.   Cardiovascular: Negative for chest pain, palpitations and leg swelling.  Gastrointestinal: Negative for abdominal distention, abdominal pain, blood in stool, constipation, diarrhea, nausea and vomiting.  Genitourinary: Negative for difficulty urinating, discharge, flank pain, genital sores, hematuria and urgency.  Musculoskeletal: Negative for arthralgias, back pain, gait problem, joint swelling, myalgias and neck stiffness.  Skin: Negative for rash.  Neurological: Negative for dizziness, syncope, speech difficulty, weakness, numbness and headaches.  Hematological: Negative for adenopathy. Does not bruise/bleed easily.  Psychiatric/Behavioral: Negative for behavioral problems and dysphoric mood. The patient is not nervous/anxious.        Objective:   Physical Exam  Constitutional: He appears well-developed and well-nourished.  HENT:  Head: Normocephalic and atraumatic.  Right Ear: External ear normal.  Left Ear: External ear normal.  Nose: Nose normal.  Mouth/Throat: Oropharynx is clear and moist.  Eyes: Conjunctivae and EOM are normal. Pupils are equal, round, and reactive to light. No scleral icterus.  Neck: Normal range of motion. Neck supple. No JVD present. No thyromegaly present.  Cardiovascular: Regular rhythm and normal heart sounds.  Exam reveals no gallop and no friction rub.   No murmur heard. Sternotomy scar Rhythm regular Pacemaker left upper anterior chest wall  area  Dorsalis pedis pulses full.  Posterior tibial pulses difficult to palpate  Pulmonary/Chest: Effort normal and breath sounds normal. He exhibits no tenderness.  Abdominal: Soft. Bowel sounds are normal. He exhibits no distension and no mass. There is no tenderness.  Genitourinary: Penis normal. Rectal exam shows guaiac negative stool.  Genitourinary Comments: Hernia into the right scrotal sac Plus 4.  Enlarged prostate  Musculoskeletal: Normal range of motion. He exhibits no edema or tenderness.  Lymphadenopathy:    He has no cervical adenopathy.  Neurological: He is alert. He has normal reflexes. No cranial nerve deficit. Coordination normal.  Skin: Skin is warm and dry. No rash noted.  Psychiatric: He has a normal mood and affect. His behavior is normal.          Assessment & Plan:   Preventive health examination Coronary artery disease History complete heart block, status post AV node ablation Diastolic heart failure Status post ICD Permanent atrial fibrillation.  Continue Coumadin anticoagulation BPH/ elevated PSA.  Options discussed including urological referral.  Patient wishes to observe and repeat in one year and defer urological evaluation at this time Statin intolerance.  Continue Zetia   [name], MD

## 2016-07-29 ENCOUNTER — Encounter: Payer: Self-pay | Admitting: Family Medicine

## 2016-07-29 ENCOUNTER — Telehealth: Payer: Self-pay | Admitting: Cardiovascular Disease

## 2016-07-29 ENCOUNTER — Ambulatory Visit (INDEPENDENT_AMBULATORY_CARE_PROVIDER_SITE_OTHER): Payer: Medicare Other | Admitting: Family Medicine

## 2016-07-29 DIAGNOSIS — R300 Dysuria: Secondary | ICD-10-CM | POA: Diagnosis not present

## 2016-07-29 DIAGNOSIS — N390 Urinary tract infection, site not specified: Secondary | ICD-10-CM

## 2016-07-29 LAB — POC URINALSYSI DIPSTICK (AUTOMATED)
Bilirubin, UA: NEGATIVE
Blood, UA: NEGATIVE
GLUCOSE UA: NEGATIVE
KETONES UA: NEGATIVE
Leukocytes, UA: NEGATIVE
Nitrite, UA: NEGATIVE
Protein, UA: NEGATIVE
SPEC GRAV UA: 1.025
Urobilinogen, UA: 2
pH, UA: 6.5

## 2016-07-29 MED ORDER — CIPROFLOXACIN HCL 500 MG PO TABS: 500.0000 mg | ORAL_TABLET | Freq: Two times a day (BID) | ORAL | 0 refills | Status: DC

## 2016-07-29 NOTE — Patient Instructions (Addendum)
You need to let your cardiology clinic know you are taking ciprofloxacin as can affect your coumadin  I suspect UTI- I want you to take antibiotic for 2 weeks. I am doing slightly longer course with consideration possible prostate infection but does not feel like it on exam  Take antibiotic twice a day with meals  Return for fever, worsening symptoms.

## 2016-07-29 NOTE — Progress Notes (Signed)
Subjective:  Grant Lawrence is a 82 y.o. year old very pleasant male patient who presents for/with See problem oriented charting ROS- see any ROS included in HPI as well.   Past Medical History-  Patient Active Problem List   Diagnosis Date Noted  . BPH (benign prostatic hyperplasia) 07/22/2016  . Elevated PSA 07/22/2016  . PVCs (premature ventricular contractions) 07/03/2016  . History of repair of mitral valve 07/03/2016  . CAD (coronary artery disease)s/p CABG 2005 08/13/2014  . Mitral valve insufficiency s/p annuloplasty 2005 08/13/2014  . s/p CRT-D [employer] Jude 2010,gen change Sept 2015 08/13/2014  . Elective replacement indicated for implantable cardioverter-defibrillator (ICD) reached July 03, 2014 08/13/2014  . HTN (hypertension) 08/13/2014  . Mixed hyperlipidemia 08/13/2014  . Aortic insufficiency 08/13/2014  . Tricuspid insufficiency 08/13/2014  . Statin intolerance 08/13/2014  . CHB (complete heart block) s/p AV node ablation 08/13/2014  . Chronic diastolic heart failure ([place]) 08/13/2014  . Permanent atrial fibrillation ([place]) 08/12/2014    Medications- reviewed and updated Current Outpatient Prescriptions  Medication Sig Dispense Refill  . carvedilol (COREG) 3.125 MG tablet TAKE ONE TABLET BY MOUTH TWICE DAILY WITH MEALS 180 tablet 3  . ezetimibe (ZETIA) 10 MG tablet Take 1 tablet (10 mg total) by mouth daily. 90 tablet 3  . hydrochlorothiazide (HYDRODIURIL) 25 MG tablet Take 12.5 mg by mouth daily.    Marland Kitchen HYDROcodone-acetaminophen (NORCO/VICODIN) 5-325 MG tablet Take 1 tablet by mouth every 6 (six) hours as needed for moderate pain. 60 tablet 0  . lisinopril (PRINIVIL,ZESTRIL) 5 MG tablet TAKE ONE TABLET BY MOUTH TWICE DAILY 180 tablet 0  . Multiple Vitamins-Minerals (CENTRUM SILVER PO) Take 1 tablet by mouth daily.    Marland Kitchen warfarin (COUMADIN) 5 MG tablet TAKE 1 AND 1/2 TABLET BY MOUTH ONCE DAILY OR AS DIRECTED BY THE COUMADIN CLINIC 135 tablet 1   No current  facility-administered medications for this visit.     Objective: BP 130/70 (BP Location: Left Arm, Patient Position: Sitting, Cuff Size: Normal)   Pulse 70   Wt 172 lb 6.4 oz (78.2 kg)   SpO2 98%   BMI 25.46 kg/m  Gen: NAD, resting comfortably CV: RRR no murmurs rubs or gallops- did not appear to be in a fib Lungs: CTAB no crackles, wheeze, rhonchi Abdomen: soft/nontender/nondistended/normal bowel sounds. No rebound or guarding.  Rectal: firm tone, diffusely enlarged prostate, no masses or tenderness Skin: warm, dry Neuro: grossly normal, moves all extremities  Assessment/Plan:  Concern UTI S: 3 weeks ago started with painful urination. He thought it would go away so he let it go.  Patient with known BPH. Had PSA done recently- he declined urological referral with plan to observe and repeat in 1 year (as of 07/22/16). No increase in frequency. Not sexually active in years. Does feel some fatigue.   Taking penicillin right now for having tooth pulled but missing some doses (in fact reviewed pills and only took 11/40 pills). He states the frequency was too hard for him to remember. Daughter with him and aware and willing to help with future prescription Lab Results  Component Value Date   PSA 9.57 (H) 06/24/2016   ROS- no rectal pain, no fever or chills, no nausea or vomiting, appetite ok  A/P: 82 year old with 3 weeks of dysuria. Well appearing but experiencing some fatigue as well. Suspect this is a UTI. Enlarged prostate on exam but not painful and prostate firm. Treat with 2 weeks cipro BID- return for new or  worsening symptoms. Asked daughter to help with compliance. Also he is on coumadin and should let that clinic know about antibiotic. Higher risk patient given age, a fib history, ICD placement, history of heart failure. Fortunately reviewed labs and no renal insufficiency. Get urine culture but empiric antibiotics while waiting. Pending UA but concern for infection high enough to  start empirically even if urine was normal.   Orders Placed This Encounter  Procedures  . Urine culture  . Urinalysis    Meds ordered this encounter  Medications  . penicillin v potassium (VEETID) 500 MG tablet    Sig: Take 500 mg by mouth 4 (four) times daily.  . ciprofloxacin (CIPRO) 500 MG tablet    Sig: Take 1 tablet (500 mg total) by mouth 2 (two) times daily.    Dispense:  28 tablet    Refill:  0    Return precautions advised.  Garret Reddish, MD

## 2016-07-29 NOTE — Telephone Encounter (Signed)
Patient was given cipro bid x 14 days, has not started yet.  MD advised that he check with cardiology before doing so.    Advised daughter to have him start the antibiotic today, will move INR check to Thursday this week.  Daughter voiced understanding.

## 2016-07-29 NOTE — Telephone Encounter (Signed)
Spoke to caller, [name] (patient's daughter).  She states pt seen by PCP today - prescribed 2 week course of Cipro. Wanted to know if safe to take and if this would affect pt's coumadin level.  Pt currently not scheduled for INR check until Sept 8th.  She is aware I will route to pharmD to review, & follow up w/ recommendations.

## 2016-07-29 NOTE — Addendum Note (Signed)
Addended by: Elmer Picker on: 07/29/2016 12:13 PM   Modules accepted: Orders

## 2016-07-29 NOTE — Progress Notes (Signed)
Pre visit review using our clinic review tool, if applicable. No additional management support is needed unless otherwise documented below in the visit note. 

## 2016-07-29 NOTE — Addendum Note (Signed)
Addended by: Mariam Dollar, Roselyn Reef M on: 07/29/2016 12:07 PM   Modules accepted: Orders

## 2016-07-29 NOTE — Telephone Encounter (Signed)
New message      Pt dtr called stating that the pt has been prescribed an antibiotic and she was afraid that it would affect the coumadin. Please call.

## 2016-07-31 LAB — URINE CULTURE: ORGANISM ID, BACTERIA: NO GROWTH

## 2016-08-01 ENCOUNTER — Ambulatory Visit (INDEPENDENT_AMBULATORY_CARE_PROVIDER_SITE_OTHER): Payer: Medicare Other | Admitting: Pharmacist Clinician (PhC)/ Clinical Pharmacy Specialist

## 2016-08-01 DIAGNOSIS — I4821 Permanent atrial fibrillation: Secondary | ICD-10-CM

## 2016-08-01 DIAGNOSIS — I4891 Unspecified atrial fibrillation: Secondary | ICD-10-CM | POA: Diagnosis not present

## 2016-08-01 DIAGNOSIS — I482 Chronic atrial fibrillation: Secondary | ICD-10-CM | POA: Diagnosis not present

## 2016-08-01 DIAGNOSIS — Z7901 Long term (current) use of anticoagulants: Secondary | ICD-10-CM

## 2016-08-01 LAB — POCT INR: INR: 2.9

## 2016-08-03 ENCOUNTER — Other Ambulatory Visit: Payer: Self-pay | Admitting: Internal Medicine

## 2016-08-15 ENCOUNTER — Telehealth: Payer: Self-pay | Admitting: Internal Medicine

## 2016-08-15 NOTE — Telephone Encounter (Signed)
Left message to call office on [name]'s voicemail regarding pt.

## 2016-08-15 NOTE — Telephone Encounter (Signed)
Patient Name: Grant Lawrence DOB: 08-Sep-1931 Initial Comment Caller says, father Dx with UTI 2 weeks ago, was given antibiotics, now Sx have returned. He has pain w/ urination. Wants an refill on the antibiotic Rx Nurse Assessment Nurse: Vallery Sa, RN, T[place] Date/Time (Eastern Time): 08/15/2016 10:46:36 AM Confirm and document reason for call. If symptomatic, describe symptoms. You must click the next button to save text entered. ---[name] states that her father completed antibiotics for UTI about 3 days ago and his pain with urination returned yesterday. No fever. No blood in his urine. No flank pain. Alert and responsive. Has the patient traveled out of the country within the last 30 days? ---No Does the patient have any new or worsening symptoms? ---Yes Will a triage be completed? ---Yes Related visit to physician within the last 2 weeks? ---Yes Does the PT have any chronic conditions? (i.e. diabetes, asthma, etc.) ---Yes List chronic conditions. ---Recent UTI, CHF Is this a behavioral health or substance abuse call? ---No Guidelines Guideline Title Affirmed Question Affirmed Notes Final Disposition User Comments [name] shared that her Dad is not present. She will call back when he returns home.

## 2016-08-16 NOTE — Telephone Encounter (Signed)
Spoke with Jeannine Boga (signed DPR) She stated patient was not home at this time. I asked her how he was feeling and she stated he was feeling much better, will call us back if he feels bad again.

## 2016-08-23 ENCOUNTER — Ambulatory Visit: Payer: Medicare Other | Admitting: Internal Medicine

## 2016-08-27 DIAGNOSIS — Z08 Encounter for follow-up examination after completed treatment for malignant neoplasm: Secondary | ICD-10-CM | POA: Diagnosis not present

## 2016-08-27 DIAGNOSIS — L578 Other skin changes due to chronic exposure to nonionizing radiation: Secondary | ICD-10-CM | POA: Diagnosis not present

## 2016-08-27 DIAGNOSIS — Z85828 Personal history of other malignant neoplasm of skin: Secondary | ICD-10-CM | POA: Diagnosis not present

## 2016-08-27 DIAGNOSIS — L570 Actinic keratosis: Secondary | ICD-10-CM | POA: Diagnosis not present

## 2016-09-13 ENCOUNTER — Ambulatory Visit (INDEPENDENT_AMBULATORY_CARE_PROVIDER_SITE_OTHER): Payer: Medicare Other | Admitting: Pharmacist

## 2016-09-13 DIAGNOSIS — I482 Chronic atrial fibrillation: Secondary | ICD-10-CM

## 2016-09-13 DIAGNOSIS — Z7901 Long term (current) use of anticoagulants: Secondary | ICD-10-CM

## 2016-09-13 DIAGNOSIS — I4821 Permanent atrial fibrillation: Secondary | ICD-10-CM

## 2016-09-13 DIAGNOSIS — I4891 Unspecified atrial fibrillation: Secondary | ICD-10-CM | POA: Diagnosis not present

## 2016-09-13 LAB — POCT INR: INR: 3.4

## 2016-10-08 ENCOUNTER — Ambulatory Visit (INDEPENDENT_AMBULATORY_CARE_PROVIDER_SITE_OTHER): Payer: Medicare Other | Admitting: *Deleted

## 2016-10-08 ENCOUNTER — Telehealth: Payer: Self-pay | Admitting: Cardiology

## 2016-10-08 DIAGNOSIS — I4821 Permanent atrial fibrillation: Secondary | ICD-10-CM

## 2016-10-08 DIAGNOSIS — I482 Chronic atrial fibrillation: Secondary | ICD-10-CM | POA: Diagnosis not present

## 2016-10-08 NOTE — Telephone Encounter (Signed)
Confirmed remote transmission w/ pt wife.   

## 2016-10-09 NOTE — Progress Notes (Signed)
Remote ICD transmission.   

## 2016-10-11 ENCOUNTER — Ambulatory Visit (INDEPENDENT_AMBULATORY_CARE_PROVIDER_SITE_OTHER): Payer: Medicare Other | Admitting: Pharmacist

## 2016-10-11 DIAGNOSIS — I482 Chronic atrial fibrillation: Secondary | ICD-10-CM | POA: Diagnosis not present

## 2016-10-11 DIAGNOSIS — I4821 Permanent atrial fibrillation: Secondary | ICD-10-CM

## 2016-10-11 DIAGNOSIS — I4891 Unspecified atrial fibrillation: Secondary | ICD-10-CM | POA: Diagnosis not present

## 2016-10-11 DIAGNOSIS — Z7901 Long term (current) use of anticoagulants: Secondary | ICD-10-CM | POA: Diagnosis not present

## 2016-10-11 LAB — POCT INR: INR: 2.7

## 2016-10-16 ENCOUNTER — Encounter: Payer: Self-pay | Admitting: Cardiology

## 2016-11-02 ENCOUNTER — Other Ambulatory Visit: Payer: Self-pay | Admitting: Internal Medicine

## 2016-11-07 LAB — CUP PACEART REMOTE DEVICE CHECK
Battery Remaining Longevity: 23 mo
Battery Remaining Percentage: 49 %
Date Time Interrogation Session: 20171031163654
HIGH POWER IMPEDANCE MEASURED VALUE: 37 Ohm
HIGH POWER IMPEDANCE MEASURED VALUE: 37 Ohm
Implantable Lead Implant Date: 20101019
Implantable Lead Implant Date: 20101019
Implantable Lead Location: 753860
Lead Channel Impedance Value: 390 Ohm
Lead Channel Impedance Value: 410 Ohm
Lead Channel Pacing Threshold Amplitude: 0.75 V
Lead Channel Sensing Intrinsic Amplitude: 11.9 mV
Lead Channel Setting Pacing Amplitude: 2.5 V
Lead Channel Setting Pacing Pulse Width: 0.5 ms
Lead Channel Setting Pacing Pulse Width: 1.2 ms
MDC IDC LEAD IMPLANT DT: 20101019
MDC IDC LEAD LOCATION: 753858
MDC IDC LEAD LOCATION: 753859
MDC IDC LEAD MODEL: 7121
MDC IDC MSMT BATTERY VOLTAGE: 2.9 V
MDC IDC MSMT LEADCHNL LV PACING THRESHOLD AMPLITUDE: 3.375 V
MDC IDC MSMT LEADCHNL LV PACING THRESHOLD PULSEWIDTH: 1.2 ms
MDC IDC MSMT LEADCHNL RV PACING THRESHOLD PULSEWIDTH: 0.5 ms
MDC IDC PG IMPLANT DT: 20150915
MDC IDC SET LEADCHNL LV PACING AMPLITUDE: 3.875
MDC IDC SET LEADCHNL RV SENSING SENSITIVITY: 2 mV
Pulse Gen Serial Number: 7207729

## 2016-11-21 ENCOUNTER — Ambulatory Visit (INDEPENDENT_AMBULATORY_CARE_PROVIDER_SITE_OTHER): Payer: Medicare Other | Admitting: Family Medicine

## 2016-11-21 ENCOUNTER — Ambulatory Visit (INDEPENDENT_AMBULATORY_CARE_PROVIDER_SITE_OTHER): Payer: Medicare Other | Admitting: Pharmacist Clinician (PhC)/ Clinical Pharmacy Specialist

## 2016-11-21 ENCOUNTER — Encounter: Payer: Self-pay | Admitting: Family Medicine

## 2016-11-21 VITALS — BP 100/56 | HR 70 | Temp 97.6°F | Ht 69.0 in | Wt 160.8 lb

## 2016-11-21 DIAGNOSIS — I4821 Permanent atrial fibrillation: Secondary | ICD-10-CM

## 2016-11-21 DIAGNOSIS — I4891 Unspecified atrial fibrillation: Secondary | ICD-10-CM

## 2016-11-21 DIAGNOSIS — Z7901 Long term (current) use of anticoagulants: Secondary | ICD-10-CM

## 2016-11-21 DIAGNOSIS — I482 Chronic atrial fibrillation: Secondary | ICD-10-CM | POA: Diagnosis not present

## 2016-11-21 DIAGNOSIS — M76891 Other specified enthesopathies of right lower limb, excluding foot: Secondary | ICD-10-CM | POA: Diagnosis not present

## 2016-11-21 LAB — POCT INR: INR: 4.3

## 2016-11-21 NOTE — Progress Notes (Signed)
Musculoskeletal Exam  Patient: Grant Lawrence DOB: 1931-01-19  DOS: 11/21/2016  SUBJECTIVE:  Chief Complaint:   Chief Complaint  Patient presents with  . Leg Pain    (R) started this am-pt states the pain is in the groin area    Grant Lawrence is a 82 y.o.  male for evaluation and treatment of R thigh pain. Here with both of his daughters.  Onset:  Started this AM after rising from a chair Location: Front of hip Character:  aching  Severity: 5/10 Progression of issue:  is unchanged Associated symptoms: None Treatment: to date has been none.   Neurovascular symptoms: no  ROS: Musculoskeletal/Extremities: +R thigh pain Neurologic: no numbness, tingling no weakness   Past Medical History:  Diagnosis Date  . CHF (congestive heart failure) ([place])   . Coronary artery disease   . Hyperlipidemia   . Hypertension    Past Surgical History:  Procedure Laterality Date  . CARDIAC DEFIBRILLATOR PLACEMENT  09/26/2009  . CORONARY ARTERY BYPASS GRAFT  2005  . CORONARY ARTERY BYPASS GRAFT    . IMPLANTABLE CARDIOVERTER DEFIBRILLATOR (ICD) GENERATOR CHANGE N/A 08/23/2014   Procedure: ICD GENERATOR CHANGE;  Surgeon: Sanda Klein, MD;  Location: [facility] CATH [facility];  Service: Cardiovascular;  Laterality: N/A;  . MITRAL VALVE REPAIR (MV)/CORONARY ARTERY BYPASS GRAFTING (CABG)    . MITRAL VALVULOPLASTY  2005   Family History  Problem Relation Age of Onset  . Cancer Father   . Diabetes Brother    Current Outpatient Prescriptions  Medication Sig Dispense Refill  . amoxicillin (AMOXIL) 500 MG capsule Take 2,000 mg by mouth. For dental procedures    . carvedilol (COREG) 3.125 MG tablet TAKE ONE TABLET BY MOUTH TWICE DAILY WITH MEALS 180 tablet 3  . ezetimibe (ZETIA) 10 MG tablet Take 1 tablet (10 mg total) by mouth daily. 90 tablet 3  . hydrochlorothiazide (HYDRODIURIL) 25 MG tablet Take 12.5 mg by mouth daily.    Marland Kitchen HYDROcodone-acetaminophen (NORCO/VICODIN) 5-325 MG tablet Take 1 tablet by  mouth every 6 (six) hours as needed for moderate pain. 60 tablet 0  . lisinopril (PRINIVIL,ZESTRIL) 5 MG tablet TAKE ONE TABLET BY MOUTH TWICE DAILY 180 tablet 1  . Multiple Vitamins-Minerals (CENTRUM SILVER PO) Take 1 tablet by mouth daily.    Marland Kitchen warfarin (COUMADIN) 5 MG tablet TAKE 1 AND 1/2 TABLET BY MOUTH ONCE DAILY OR AS DIRECTED BY THE COUMADIN CLINIC 135 tablet 1   Allergies  Allergen Reactions  . Rosuvastatin Calcium     Myalgia  . Statins     Myalgia   Social History   Social History  . Marital status: Married   Social History Main Topics  . Smoking status: Never Smoker  . Alcohol use No  . Drug use: No    Objective: VITAL SIGNS: BP (!) 100/56 (BP Location: Left Arm, Patient Position: Sitting, Cuff Size: Normal)   Pulse 70   Temp 97.6 F (36.4 C) (Oral)   Ht 5\' 9"  (1.753 m)   Wt 160 lb 12.8 oz (72.9 kg)   SpO2 99%   BMI 23.75 kg/m  Constitutional: Well formed, well developed. No acute distress. Cardiovascular: RRR Thorax & Lungs: No accessory muscle use, CTAB Extremities: No clubbing. No cyanosis. No edema.  Skin: Warm. Dry. No erythema. No rash.  Musculoskeletal: R hip.   Tenderness to palpation: yes over anterior hip flexor Deformity: no Ecchymosis: no Tests positive: [place] Tests negative: FADDIR, FABER, log roll GU: No bulging or evidence of hernia,  no testicular TTP Neurologic: Normal sensory function. No focal deficits noted. DTR's equal and symmetry in LE's. No clonus. Psychiatric: Normal mood. Age appropriate judgment and insight. Alert & oriented x 3.    Assessment:  Hip flexor tendinitis, right  Plan: Avoid NSAIDs if able as he is on ACEi and Coumadin. Tylenol for pain. Gave dosing and schedule in AVS, safe with Norco. Use his previously rx'd Norco judiciously. F/u in 4 weeks if no improvement, consider PT at that time. The patient and his daughters voiced understanding and agreement to the plan.   [place], DO 11/21/16   10:09 AM

## 2016-11-21 NOTE — Patient Instructions (Addendum)
1000 mg of Tylenol up to three times daily for pain would be ideal. Don't take more than 2 tabs in 1 day while on this much Tylenol.  Heat may be helpful as well.   If things aren't improving after 2-4 weeks, follow up with Dr. Raliegh Ip for further evaluation.

## 2016-11-22 ENCOUNTER — Other Ambulatory Visit: Payer: Self-pay

## 2016-11-25 ENCOUNTER — Telehealth: Payer: Self-pay | Admitting: Internal Medicine

## 2016-11-25 NOTE — Telephone Encounter (Signed)
° °  Pt request refill of the following:   ezetimibe (ZETIA) 10 MG tablet   Phamacy:  [facility] number  [phone]

## 2016-11-26 ENCOUNTER — Encounter: Payer: Self-pay | Admitting: Internal Medicine

## 2016-11-26 MED ORDER — EZETIMIBE 10 MG PO TABS: 10.0000 mg | ORAL_TABLET | Freq: Every day | ORAL | 3 refills | Status: DC

## 2016-11-26 NOTE — Telephone Encounter (Signed)
Rx faxed to pharmacy as requested.

## 2016-11-26 NOTE — Progress Notes (Signed)
Order(s) created erroneously. Erroneous order ID: [id number]  Order moved by: Kelly Splinter  Order move date/time: 11/26/2016 2:23 PM  Source Patient: [id number]  Source Contact: 11/26/2016  Destination Patient: [id number]  Destination Contact: 02/23/2013  Erroneous order ID: [id number]  Order moved by: Kelly Splinter  Order move date/time: 11/26/2016 2:23 PM  Source Patient: [id number]  Source Contact: 11/26/2016  Destination Patient: [id number]  Destination Contact: 02/23/2013  Erroneous order ID: [id number]  Order moved by: Kelly Splinter  Order move date/time: 11/26/2016 2:23 PM  Source Patient: [id number]  Source Contact: 11/26/2016  Destination Patient: [id number]  Destination Contact: 02/23/2013  Erroneous order ID: [id number]  Order moved by: Kelly Splinter  Order move date/time: 11/26/2016 2:23 PM  Source Patient: [id number]  Source Contact: 11/26/2016  Destination Patient: [id number]  Destination Contact: 02/23/2013  Erroneous order ID: [id number]  Order moved by: Kelly Splinter  Order move date/time: 11/26/2016 2:23 PM  Source Patient: [id number]  Source Contact: 11/26/2016  Destination Patient: [id number]  Destination Contact: 02/23/2013  Erroneous order ID: [id number]  Order moved by: Kelly Splinter  Order move date/time: 11/26/2016 2:23 PM  Source Patient: [id number]  Source Contact: 11/26/2016  Destination Patient: [id number]  Destination Contact: 02/23/2013

## 2016-12-06 ENCOUNTER — Ambulatory Visit (INDEPENDENT_AMBULATORY_CARE_PROVIDER_SITE_OTHER): Payer: Medicare Other | Admitting: Pharmacist

## 2016-12-06 DIAGNOSIS — Z7901 Long term (current) use of anticoagulants: Secondary | ICD-10-CM

## 2016-12-06 DIAGNOSIS — I482 Chronic atrial fibrillation: Secondary | ICD-10-CM

## 2016-12-06 DIAGNOSIS — I4891 Unspecified atrial fibrillation: Secondary | ICD-10-CM | POA: Diagnosis not present

## 2016-12-06 DIAGNOSIS — I4821 Permanent atrial fibrillation: Secondary | ICD-10-CM

## 2016-12-06 LAB — POCT INR: INR: 3.4

## 2016-12-20 ENCOUNTER — Ambulatory Visit (INDEPENDENT_AMBULATORY_CARE_PROVIDER_SITE_OTHER): Payer: Medicare Other | Admitting: Pharmacist

## 2016-12-20 DIAGNOSIS — I4891 Unspecified atrial fibrillation: Secondary | ICD-10-CM

## 2016-12-20 DIAGNOSIS — I482 Chronic atrial fibrillation: Secondary | ICD-10-CM

## 2016-12-20 DIAGNOSIS — I4821 Permanent atrial fibrillation: Secondary | ICD-10-CM

## 2016-12-20 DIAGNOSIS — Z7901 Long term (current) use of anticoagulants: Secondary | ICD-10-CM | POA: Diagnosis not present

## 2016-12-20 LAB — POCT INR: INR: 3.3

## 2016-12-27 ENCOUNTER — Other Ambulatory Visit: Payer: Self-pay | Admitting: Internal Medicine

## 2017-01-02 ENCOUNTER — Telehealth: Payer: Self-pay | Admitting: Cardiovascular Disease

## 2017-01-02 ENCOUNTER — Ambulatory Visit (INDEPENDENT_AMBULATORY_CARE_PROVIDER_SITE_OTHER): Payer: Medicare Other | Admitting: Pharmacist

## 2017-01-02 ENCOUNTER — Encounter: Payer: Self-pay | Admitting: Cardiovascular Disease

## 2017-01-02 ENCOUNTER — Ambulatory Visit (INDEPENDENT_AMBULATORY_CARE_PROVIDER_SITE_OTHER): Payer: Medicare Other | Admitting: Cardiovascular Disease

## 2017-01-02 VITALS — BP 132/70 | HR 74 | Ht 69.0 in | Wt 157.0 lb

## 2017-01-02 DIAGNOSIS — I361 Nonrheumatic tricuspid (valve) insufficiency: Secondary | ICD-10-CM

## 2017-01-02 DIAGNOSIS — Z7901 Long term (current) use of anticoagulants: Secondary | ICD-10-CM

## 2017-01-02 DIAGNOSIS — I4821 Permanent atrial fibrillation: Secondary | ICD-10-CM

## 2017-01-02 DIAGNOSIS — Z9889 Other specified postprocedural states: Secondary | ICD-10-CM

## 2017-01-02 DIAGNOSIS — Z95810 Presence of automatic (implantable) cardiac defibrillator: Secondary | ICD-10-CM | POA: Diagnosis not present

## 2017-01-02 DIAGNOSIS — I482 Chronic atrial fibrillation: Secondary | ICD-10-CM | POA: Diagnosis not present

## 2017-01-02 DIAGNOSIS — E782 Mixed hyperlipidemia: Secondary | ICD-10-CM

## 2017-01-02 DIAGNOSIS — I442 Atrioventricular block, complete: Secondary | ICD-10-CM

## 2017-01-02 DIAGNOSIS — I351 Nonrheumatic aortic (valve) insufficiency: Secondary | ICD-10-CM

## 2017-01-02 DIAGNOSIS — I4891 Unspecified atrial fibrillation: Secondary | ICD-10-CM

## 2017-01-02 DIAGNOSIS — I5032 Chronic diastolic (congestive) heart failure: Secondary | ICD-10-CM

## 2017-01-02 DIAGNOSIS — I2510 Atherosclerotic heart disease of native coronary artery without angina pectoris: Secondary | ICD-10-CM

## 2017-01-02 DIAGNOSIS — I10 Essential (primary) hypertension: Secondary | ICD-10-CM

## 2017-01-02 LAB — POCT INR: INR: 2.2

## 2017-01-02 NOTE — Telephone Encounter (Signed)
Spoke to [name], daughter of patient who manages pt meds. She was not at visit today. Notes Dr. Sallyanne Kuster wanted to clarify dose patient is taking.  Grant Lawrence confirmed that the pt has been indeed taking the HCTZ half-tablet (12.5mg ) instead of whole. States consideration was made to cut this medication out completely.  Please advise on any recommendations.

## 2017-01-02 NOTE — Progress Notes (Signed)
Cardiology Office Note    Date:  01/03/2017   ID:  Grant Lawrence, DOB 25-Mar-1931, MRN [id number]  PCP:  [name], MD  Cardiologist:   Sanda Klein, MD   Chief Complaint  Patient presents with  . Follow-up    multi ppm  . Dizziness    History of Present Illness:  Grant Lawrence is a 83 y.o. male here in follow up for non ischemic cardiomyopathy, chronic diastolic heart failure, complete heart block status post AV node ablation, permanent atrial fibrillation, history of severe systolic left ventricular dysfunction with recovery after arrhythmia control and biventricular pacing, here for CRT-D device follow-up. He is accompanied by his daughter today.  [name] generally feels very well. He denies any complaints of dyspnea or chest pain either at rest or with exertion and has not expressed palpitations, syncope, lower extremity edema or other cardiac vascular complaints. He has easy bruising but otherwise no serious bleeding complications while on treatment with warfarin. He does not have a history of stroke/TIA or other embolic events.  He has lost substantial weight. He lives alone and often does not eat lunch. His belts shows that he has lost 3 notches from his waistline. As lost 15 pounds just since August.  Interrogation of his St. Jude CRT-D unify assura device shows normal function. His left ventricular lead pacing threshold is relatively high, accounting for the rather short generator longevity (device implanted 2015). estimated to reach ERI in 1.8 years. There is no underlying ventricular escape rhythm when pacing is taken down to 30 bpm. He has 96% biventricular pacing, Which is an improvement from his previous visits. The burden of PVCs is a little lower. He is unaware of the arrhythmia. Corvue is normal.  He has atrial fibrillation status post AV node ablation with a St. Jude Unify dual-chamber biventricular pacemaker/defibrillator implanted in October  2010, generator changeout in 2015. The device is programmed VVIR secondary to permanent atrial fibrillation. He has not received tachyarrhythmia therapies nor are there any episodes of VT/VF recorded. He is on chronic warfarin anticoagulation and I don't think he has ever had a stroke or TIA.  He had a myocardial infarction in 1999. He had severe mitral insufficiency. Cardiac catheterization in July 2005 showed a 70-80% stenosis of the LAD and 80-90% ostial diagonal stenosis. In August 2005 he underwent bypass surgery and mitral valve annuloplasty repair. He has moderate tricuspid insufficiency, mild to moderate aortic insufficiency. He had pulmonary arterial hypertension, not present on last echo. He carries a diagnosis of chronic compensated diastolic heart failure  His most recent echocardiogram in September 2015 showed an improved ejection fraction to 50-55% (at time of ICD implantation was 25% and had been 45% in September 2011, 55% in 2014) and mild mitral insufficiency. Mean transvalvular gradient was 2.1 mm Hg. He had moderate aortic insufficiency and tricuspid insufficiency. Left atrium 5.6 cm. LV end systolic diameter 3.6 cm. sPAP 28 mm Hg.  He has a history of systemic hypertension and mixed hyperlipidemia but has never smoked and does not have diabetes mellitus. He does not tolerate statins and is on Zetia monotherapy.   Past Medical History:  Diagnosis Date  . CHF (congestive heart failure) ([place])   . Coronary artery disease   . Hyperlipidemia   . Hypertension     Past Surgical History:  Procedure Laterality Date  . CARDIAC DEFIBRILLATOR PLACEMENT  09/26/2009  . CORONARY ARTERY BYPASS GRAFT  2005  . CORONARY ARTERY BYPASS GRAFT    .  IMPLANTABLE CARDIOVERTER DEFIBRILLATOR (ICD) GENERATOR CHANGE N/A 08/23/2014   Procedure: ICD GENERATOR CHANGE;  Surgeon: Sanda Klein, MD;  Location: [facility];  Service: Cardiovascular;  Laterality: N/A;  . MITRAL VALVE REPAIR (MV)/CORONARY  ARTERY BYPASS GRAFTING (CABG)    . MITRAL VALVULOPLASTY  2005    Current Medications: Outpatient Medications Prior to Visit  Medication Sig Dispense Refill  . amoxicillin (AMOXIL) 500 MG capsule Take 2,000 mg by mouth. For dental procedures    . carvedilol (COREG) 3.125 MG tablet TAKE ONE TABLET BY MOUTH TWICE DAILY WITH MEALS 180 tablet 3  . ezetimibe (ZETIA) 10 MG tablet Take 1 tablet (10 mg total) by mouth daily. 90 tablet 3  . HYDROcodone-acetaminophen (NORCO/VICODIN) 5-325 MG tablet Take 1 tablet by mouth every 6 (six) hours as needed for moderate pain. 60 tablet 0  . lisinopril (PRINIVIL,ZESTRIL) 5 MG tablet TAKE ONE TABLET BY MOUTH TWICE DAILY 180 tablet 1  . Multiple Vitamins-Minerals (CENTRUM SILVER PO) Take 1 tablet by mouth daily.    Marland Kitchen warfarin (COUMADIN) 5 MG tablet TAKE 1 AND 1/2 TABLET BY MOUTH ONCE DAILY OR AS DIRECTED BY THE COUMADIN CLINIC 135 tablet 1  . hydrochlorothiazide (HYDRODIURIL) 25 MG tablet Take 12.5 mg by mouth daily.    . hydrochlorothiazide (HYDRODIURIL) 25 MG tablet TAKE ONE TABLET BY MOUTH ONCE DAILY 90 tablet 1   No facility-administered medications prior to visit.      Allergies:   Rosuvastatin calcium and Statins   Social History   Social History  . Marital status: Married    Spouse name: N/A  . Number of children: N/A  . Years of education: N/A   Social History Main Topics  . Smoking status: Never Smoker  . Smokeless tobacco: Never Used  . Alcohol use No  . Drug use: No  . Sexual activity: Not Asked   Other Topics Concern  . None   Social History Narrative  . None     Family History:  The patient's family history includes Cancer in his father; Diabetes in his brother.   ROS:   Please see the history of present illness.    ROS All other systems reviewed and are negative.   PHYSICAL EXAM:   VS:  BP 132/70   Pulse 74   Ht 5\' 9"  (1.753 m)   Wt 71.2 kg (157 lb)   BMI 23.18 kg/m    GEN: Well nourished, well developed, in no  acute distress  HEENT: normal  Neck: no JVD, carotid bruits, or masses Cardiac: Paradoxically split second heart sound RRR; no murmurs, rubs, or gallops,no edema , healthy subclavian pacemaker site Respiratory:  clear to auscultation bilaterally, normal work of breathing GI: soft, nontender, nondistended, + BS MS: no deformity or atrophy  Skin: warm and dry, no rash Neuro:  Alert and Oriented x 3, Strength and sensation are intact Psych: euthymic mood, full affect  Wt Readings from Last 3 Encounters:  01/02/17 71.2 kg (157 lb)  11/21/16 72.9 kg (160 lb 12.8 oz)  07/29/16 78.2 kg (172 lb 6.4 oz)      Studies/Labs Reviewed:   EKG:  EKG is ordered today.  The ekg ordered today demonstrates Atrial fibrillation, biventricular pacing at 70 bpm, QTC 475 ms  Recent Labs: 06/24/2016: ALT 15; BUN 20; Creatinine, Ser 0.83; Hemoglobin 13.1; Platelets 135.0; Potassium 4.1; Sodium 140; TSH 2.02   Lipid Panel    Component Value Date/Time   CHOL 172 06/24/2016 0823   TRIG 62.0 06/24/2016 0823  HDL 53.20 06/24/2016 0823   CHOLHDL 3 06/24/2016 0823   VLDL 12.4 06/24/2016 0823   LDLCALC 106 (H) 06/24/2016 [id number]    ASSESSMENT:    1. Chronic diastolic heart failure ([place])   2. CHB (complete heart block) ([facility])   3. Cardiac resynchronization therapy defibrillator (CRT-D) in place   4. Permanent atrial fibrillation ([place])   5. Mixed hyperlipidemia   6. Coronary artery disease involving native coronary artery of native heart without angina pectoris   7. History of repair of mitral valve   8. Essential hypertension   9. Nonrheumatic aortic valve insufficiency   10. Non-rheumatic tricuspid valve insufficiency      PLAN:  In order of problems listed above:  1. Chronic diastolic heart failure, well compensated, euvolemic despite no direct therapy. LVEF 50-55 % in September 2015 by echo. 2. CHB s/p AV node ablation for intractable atrial fibrillation with rapid ventricular response; he is  pacemaker dependent. 3. CRT-D: normal function other than relatively high pacing threshold on the left ventricular lead which limits device longevity. No episodes of ventricular sustained arrhythmia have been recorded, but he has very frequent PVCs, reducing biventricular pacing efficiency to only 96%. Despite this he has very well compensated heart failure. When the time comes for generator change out, probably should have a "downgrade" to CRT-P. 4. Permanent atrial fibrillation. CHADSVasc 4 (age 58, HF, CAD). Severely dilated left atrium. Continue anticoagulation. 5. Hyperlipidemia, on ezetimibe, statin intolerant. LDL cholesterol is not at target range but is acceptable. It has probably improved since the last assay since has lost substantial weight. 6. CAD status post remote myocardial infarction 1999, status post coronary bypass surgery August 2005 without coronary event since that time. 7. S/P mitral valve annuloplasty repair, mild residual regurgitation by echo. 8. HTN: Blood pressure is normal. It is often lower at home and he has had some dizziness. Will stop hydrochlorothiazide altogether. 9. Moderate tricuspid insufficiency and moderate aortic insufficiency. No evidence of significant pulmonary artery hypertension. Tricuspid insufficiency may be related to the presence of 3 pacemaker leads. Neither valvular abnormality appears hemodynamically important at this time. I think it's unlikely we will recommend surgical therapy for his valve problems in the future.   Medication Adjustments/Labs and Tests Ordered: Current medicines are reviewed at length with the patient today.  Concerns regarding medicines are outlined above.  Medication changes, Labs and Tests ordered today are listed in the Patient Instructions below. Patient Instructions  Dr Sallyanne Kuster recommends that you continue on your current medications as directed. Please refer to the Current Medication list given to you today.  Remote  monitoring is used to monitor your Pacemaker of ICD from home. This monitoring reduces the number of office visits required to check your device to one time per year. It allows [place] to keep an eye on the functioning of your device to ensure it is working properly. You are scheduled for a device check from home on Thursday, April 26th, 2018. You may send your transmission at any time that day. If you have a wireless device, the transmission will be sent automatically. After your physician reviews your transmission, you will receive a postcard with your next transmission date.  Dr Sallyanne Kuster recommends that you schedule a follow-up appointment in 12 months with a pacemaker check. You will receive a reminder letter in the mail two months in advance. If you don't receive a letter, please call our office to schedule the follow-up appointment.  If you need a refill on your  cardiac medications before your next appointment, please call your pharmacy.    Signed, Sanda Klein, MD  01/03/2017 9:16 AM    [facility] [address], [place], [place]  [address] Phone: [phone]; Fax: [phone]

## 2017-01-02 NOTE — Telephone Encounter (Signed)
Let's stop the HCTZ. Please call if he develops leg swelling or shortness of breath. MCr

## 2017-01-02 NOTE — Telephone Encounter (Signed)
Ms. Francina Ames was calling on behalf of her father patient. She wanted to relay information to Dr.Croituru, they have cut his medication hydrochlorothiazide in half and she also stated that Dr.C stated that he might take Mr. Sowerby off of this medication. So as of now Mr. Mohn has been taking half of this medication each day. Please call, thanks.

## 2017-01-02 NOTE — Patient Instructions (Signed)
Dr Sallyanne Kuster recommends that you continue on your current medications as directed. Please refer to the Current Medication list given to you today.  Remote monitoring is used to monitor your Pacemaker of ICD from home. This monitoring reduces the number of office visits required to check your device to one time per year. It allows [place] to keep an eye on the functioning of your device to ensure it is working properly. You are scheduled for a device check from home on Thursday, April 26th, 2018. You may send your transmission at any time that day. If you have a wireless device, the transmission will be sent automatically. After your physician reviews your transmission, you will receive a postcard with your next transmission date.  Dr Sallyanne Kuster recommends that you schedule a follow-up appointment in 12 months with a pacemaker check. You will receive a reminder letter in the mail two months in advance. If you don't receive a letter, please call our office to schedule the follow-up appointment.  If you need a refill on your cardiac medications before your next appointment, please call your pharmacy.

## 2017-01-02 NOTE — Telephone Encounter (Signed)
Recommendations relayed to daughter [name] on VM msg. Instructed to call if questions or clarification needed.

## 2017-01-11 ENCOUNTER — Other Ambulatory Visit: Payer: Self-pay | Admitting: Cardiovascular Disease

## 2017-01-16 LAB — CUP PACEART INCLINIC DEVICE CHECK
Date Time Interrogation Session: 20180208132704
Implantable Lead Implant Date: 20101019
Implantable Lead Implant Date: 20101019
Implantable Lead Location: 753858
Implantable Lead Location: 753859
Implantable Lead Model: 7121
Implantable Pulse Generator Implant Date: 20150915
Lead Channel Setting Pacing Amplitude: 2.5 V
Lead Channel Setting Pacing Amplitude: 3.875
Lead Channel Setting Pacing Pulse Width: 1.2 ms
Lead Channel Setting Sensing Sensitivity: 2 mV
MDC IDC LEAD IMPLANT DT: 20101019
MDC IDC LEAD LOCATION: 753860
MDC IDC PG SERIAL: 7207729
MDC IDC SET LEADCHNL RV PACING PULSEWIDTH: 0.5 ms

## 2017-01-24 ENCOUNTER — Encounter: Payer: Self-pay | Admitting: Internal Medicine

## 2017-01-24 ENCOUNTER — Ambulatory Visit (INDEPENDENT_AMBULATORY_CARE_PROVIDER_SITE_OTHER): Payer: Medicare Other | Admitting: Internal Medicine

## 2017-01-24 VITALS — BP 132/68 | HR 74 | Temp 97.6°F | Ht 69.0 in | Wt 171.2 lb

## 2017-01-24 DIAGNOSIS — I2510 Atherosclerotic heart disease of native coronary artery without angina pectoris: Secondary | ICD-10-CM | POA: Diagnosis not present

## 2017-01-24 DIAGNOSIS — I482 Chronic atrial fibrillation: Secondary | ICD-10-CM | POA: Diagnosis not present

## 2017-01-24 DIAGNOSIS — Z23 Encounter for immunization: Secondary | ICD-10-CM

## 2017-01-24 DIAGNOSIS — Z95810 Presence of automatic (implantable) cardiac defibrillator: Secondary | ICD-10-CM

## 2017-01-24 DIAGNOSIS — I10 Essential (primary) hypertension: Secondary | ICD-10-CM | POA: Diagnosis not present

## 2017-01-24 DIAGNOSIS — I5032 Chronic diastolic (congestive) heart failure: Secondary | ICD-10-CM

## 2017-01-24 DIAGNOSIS — I4821 Permanent atrial fibrillation: Secondary | ICD-10-CM

## 2017-01-24 NOTE — Patient Instructions (Signed)
Limit your sodium (Salt) intake  Return in 6 months for follow-up    Cardiology follow-up as scheduled  No change in medical regimen   

## 2017-01-24 NOTE — Progress Notes (Signed)
Subjective:    Patient ID: Grant Lawrence, male    DOB: [birth year], 83 y.o.   MRN: [id number]  HPI  83 year old patient who is seen today for follow-up.  He is followed closely by cardiology with a history of coronary artery disease, permanent atrial fibrillation and diastolic heart failure.  He has a history complete heart block and is status post CRT. There is been some modest weight loss since August but apparently he is eating well, especially breakfast His cardiac status appears stable.  He remains on chronic Coumadin anticoagulation. He has essential hypertension and history of mild dyslipidemia  Past Medical History:  Diagnosis Date  . CHF (congestive heart failure) ([place])   . Coronary artery disease   . Hyperlipidemia   . Hypertension      Social History   Social History  . Marital status: Married    Spouse name: N/A  . Number of children: N/A  . Years of education: N/A   Occupational History  . Not on file.   Social History Main Topics  . Smoking status: Never Smoker  . Smokeless tobacco: Never Used  . Alcohol use No  . Drug use: No  . Sexual activity: Not on file   Other Topics Concern  . Not on file   Social History Narrative  . No narrative on file    Past Surgical History:  Procedure Laterality Date  . CARDIAC DEFIBRILLATOR PLACEMENT  09/26/2009  . CORONARY ARTERY BYPASS GRAFT  2005  . CORONARY ARTERY BYPASS GRAFT    . IMPLANTABLE CARDIOVERTER DEFIBRILLATOR (ICD) GENERATOR CHANGE N/A 08/23/2014   Procedure: ICD GENERATOR CHANGE;  Surgeon: Sanda Klein, MD;  Location: [facility];  Service: Cardiovascular;  Laterality: N/A;  . MITRAL VALVE REPAIR (MV)/CORONARY ARTERY BYPASS GRAFTING (CABG)    . MITRAL VALVULOPLASTY  2005    Family History  Problem Relation Age of Onset  . Cancer Father   . Diabetes Brother     Allergies  Allergen Reactions  . Rosuvastatin Calcium     Myalgia  . Statins     Myalgia    Current Outpatient Prescriptions  on File Prior to Visit  Medication Sig Dispense Refill  . amoxicillin (AMOXIL) 500 MG capsule Take 2,000 mg by mouth. For dental procedures    . carvedilol (COREG) 3.125 MG tablet TAKE ONE TABLET BY MOUTH TWICE DAILY WITH MEALS 180 tablet 3  . ezetimibe (ZETIA) 10 MG tablet Take 1 tablet (10 mg total) by mouth daily. 90 tablet 3  . HYDROcodone-acetaminophen (NORCO/VICODIN) 5-325 MG tablet Take 1 tablet by mouth every 6 (six) hours as needed for moderate pain. 60 tablet 0  . lisinopril (PRINIVIL,ZESTRIL) 5 MG tablet TAKE ONE TABLET BY MOUTH TWICE DAILY 180 tablet 1  . Multiple Vitamins-Minerals (CENTRUM SILVER PO) Take 1 tablet by mouth daily.    Marland Kitchen warfarin (COUMADIN) 5 MG tablet TAKE ONE & ONE-HALF TABLETS BY MOUTH ONCE DAILY OR  AS  DIRECTED  BY  THE  COUMADIN  CLINIC 100 tablet 1   No current facility-administered medications on file prior to visit.     BP 132/68 (BP Location: Right Arm, Patient Position: Sitting, Cuff Size: Normal)   Pulse 74   Temp 97.6 F (36.4 C) (Oral)   Ht 5\' 9"  (1.753 m)   Wt 171 lb 3.2 oz (77.7 kg)   SpO2 98%   BMI 25.28 kg/m     Review of Systems  Constitutional: Positive for unexpected weight change. Negative for  appetite change, chills, fatigue and fever.  HENT: Negative for congestion, dental problem, ear pain, hearing loss, sore throat, tinnitus, trouble swallowing and voice change.   Eyes: Negative for pain, discharge and visual disturbance.  Respiratory: Negative for cough, chest tightness, wheezing and stridor.   Cardiovascular: Negative for chest pain, palpitations and leg swelling.  Gastrointestinal: Negative for abdominal distention, abdominal pain, blood in stool, constipation, diarrhea, nausea and vomiting.  Genitourinary: Negative for difficulty urinating, discharge, flank pain, genital sores, hematuria and urgency.  Musculoskeletal: Positive for gait problem. Negative for arthralgias, back pain, joint swelling, myalgias and neck stiffness.    Skin: Negative for rash.  Neurological: Negative for dizziness, syncope, speech difficulty, weakness, numbness and headaches.  Hematological: Negative for adenopathy. Does not bruise/bleed easily.  Psychiatric/Behavioral: Negative for behavioral problems and dysphoric mood. The patient is not nervous/anxious.        Objective:   Physical Exam  Constitutional: He is oriented to person, place, and time. He appears well-developed.  Elderly, frail.  Blood pressure normal  HENT:  Head: Normocephalic.  Right Ear: External ear normal.  Left Ear: External ear normal.  Eyes: Conjunctivae and EOM are normal.  Neck: Normal range of motion.  Cardiovascular: Normal rate and normal heart sounds.   Pulmonary/Chest: Breath sounds normal.  A few basilar crackles Pacemaker left upper anterior chest wall  Abdominal: Bowel sounds are normal.  Musculoskeletal: Normal range of motion. He exhibits no edema or tenderness.  Neurological: He is alert and oriented to person, place, and time.  Skin:  Dry scaly feet.  Warm to touch  Psychiatric: He has a normal mood and affect. His behavior is normal.          Assessment & Plan:   Permanent atrial fibrillation.  Continue Coumadin anticoagulation Essential hypertension, stable Modest weight loss.  Increase caloric intake.  Encouraged.  Will continue to observe Dyslipidemia  Continue low-salt diet  Follow-up 6 months  Grant Lawrence Grant Lawrence

## 2017-01-24 NOTE — Progress Notes (Signed)
Pre visit review using our clinic review tool, if applicable. No additional management support is needed unless otherwise documented below in the visit note. 

## 2017-01-25 ENCOUNTER — Other Ambulatory Visit: Payer: Self-pay | Admitting: Cardiovascular Disease

## 2017-01-30 LAB — PSA: PSA: 9.57

## 2017-01-31 ENCOUNTER — Ambulatory Visit (INDEPENDENT_AMBULATORY_CARE_PROVIDER_SITE_OTHER): Payer: Medicare Other | Admitting: Pharmacist

## 2017-01-31 DIAGNOSIS — I4821 Permanent atrial fibrillation: Secondary | ICD-10-CM

## 2017-01-31 DIAGNOSIS — Z7901 Long term (current) use of anticoagulants: Secondary | ICD-10-CM

## 2017-01-31 DIAGNOSIS — I482 Chronic atrial fibrillation: Secondary | ICD-10-CM

## 2017-01-31 DIAGNOSIS — I4891 Unspecified atrial fibrillation: Secondary | ICD-10-CM | POA: Diagnosis not present

## 2017-01-31 LAB — POCT INR: INR: 2.2

## 2017-02-06 DIAGNOSIS — H25013 Cortical age-related cataract, bilateral: Secondary | ICD-10-CM | POA: Diagnosis not present

## 2017-02-06 DIAGNOSIS — H524 Presbyopia: Secondary | ICD-10-CM | POA: Diagnosis not present

## 2017-02-06 DIAGNOSIS — H35033 Hypertensive retinopathy, bilateral: Secondary | ICD-10-CM | POA: Diagnosis not present

## 2017-02-25 ENCOUNTER — Telehealth: Payer: Self-pay | Admitting: Internal Medicine

## 2017-02-25 NOTE — Telephone Encounter (Signed)
Last OV with Dr Raliegh Ip On 01/24/17 Last time medication was refilled On 03/05/16 Ok to refill medication? Please advise

## 2017-02-25 NOTE — Telephone Encounter (Signed)
Would defer to primary since he does not take regularly.  If he is having new/acute pain, recommend follow up to evaluate.

## 2017-02-25 NOTE — Telephone Encounter (Signed)
° °  Pt is requesting a refill on the below RX   med has not been filled in in months. Pt is aware that Dr Raliegh Ip is out of office   Pt request refill of the following:  HYDROcodone-acetaminophen (NORCO/VICODIN) 5-325 MG tablet   Phamacy:

## 2017-02-27 ENCOUNTER — Telehealth: Payer: Self-pay | Admitting: Pharmacist

## 2017-02-27 ENCOUNTER — Ambulatory Visit (INDEPENDENT_AMBULATORY_CARE_PROVIDER_SITE_OTHER): Payer: Medicare Other | Admitting: Family Medicine

## 2017-02-27 ENCOUNTER — Encounter: Payer: Self-pay | Admitting: Family Medicine

## 2017-02-27 VITALS — BP 136/80 | HR 73 | Temp 97.3°F | Ht 69.0 in | Wt 163.6 lb

## 2017-02-27 DIAGNOSIS — R350 Frequency of micturition: Secondary | ICD-10-CM | POA: Diagnosis not present

## 2017-02-27 DIAGNOSIS — R143 Flatulence: Secondary | ICD-10-CM | POA: Diagnosis not present

## 2017-02-27 DIAGNOSIS — N390 Urinary tract infection, site not specified: Secondary | ICD-10-CM

## 2017-02-27 DIAGNOSIS — R319 Hematuria, unspecified: Secondary | ICD-10-CM | POA: Diagnosis not present

## 2017-02-27 LAB — POC URINALSYSI DIPSTICK (AUTOMATED)
Bilirubin, UA: NEGATIVE
GLUCOSE UA: NEGATIVE
Ketones, UA: NEGATIVE
Leukocytes, UA: NEGATIVE
Nitrite, UA: NEGATIVE
SPEC GRAV UA: 1.03 (ref 1.030–1.035)
Urobilinogen, UA: 0.2 (ref ?–2.0)
pH, UA: 6 (ref 5.0–8.0)

## 2017-02-27 MED ORDER — SULFAMETHOXAZOLE-TRIMETHOPRIM 800-160 MG PO TABS: 1.0000 | ORAL_TABLET | Freq: Two times a day (BID) | ORAL | 0 refills | Status: DC

## 2017-02-27 MED ORDER — HYDROCODONE-ACETAMINOPHEN 5-325 MG PO TABS: 1.0000 | ORAL_TABLET | Freq: Four times a day (QID) | ORAL | 0 refills | Status: DC | PRN

## 2017-02-27 NOTE — Progress Notes (Signed)
Subjective:  Grant Lawrence is a 83 y.o. year old very pleasant male patient who presents for/with See problem oriented charting ROS- no fever or chills. No cva tenderness or flank pain. Does not feel systemically ill   Past Medical History-  Patient Active Problem List   Diagnosis Date Noted  . BPH (benign prostatic hyperplasia) 07/22/2016  . Elevated PSA 07/22/2016  . PVCs (premature ventricular contractions) 07/03/2016  . History of repair of mitral valve 07/03/2016  . CAD (coronary artery disease)s/p CABG 2005 08/13/2014  . Mitral valve insufficiency s/p annuloplasty 2005 08/13/2014  . s/p CRT-D St Jude 2010,gen change Sept 2015 08/13/2014  . Elective replacement indicated for implantable cardioverter-defibrillator (ICD) reached July 03, 2014 08/13/2014  . HTN (hypertension) 08/13/2014  . Mixed hyperlipidemia 08/13/2014  . Aortic insufficiency 08/13/2014  . Tricuspid insufficiency 08/13/2014  . Statin intolerance 08/13/2014  . CHB (complete heart block) s/p AV node ablation 08/13/2014  . Chronic diastolic heart failure ([place]) 08/13/2014  . Permanent atrial fibrillation ([place]) 08/12/2014    Medications- reviewed and updated Current Outpatient Prescriptions  Medication Sig Dispense Refill  . amoxicillin (AMOXIL) 500 MG capsule Take 2,000 mg by mouth. For dental procedures    . carvedilol (COREG) 3.125 MG tablet TAKE ONE TABLET BY MOUTH TWICE DAILY WITH MEALS 180 tablet 3  . ezetimibe (ZETIA) 10 MG tablet Take 1 tablet (10 mg total) by mouth daily. 90 tablet 3  . HYDROcodone-acetaminophen (NORCO/VICODIN) 5-325 MG tablet Take 1 tablet by mouth every 6 (six) hours as needed for moderate pain. 60 tablet 0  . lisinopril (PRINIVIL,ZESTRIL) 5 MG tablet TAKE ONE TABLET BY MOUTH TWICE DAILY 180 tablet 1  . Multiple Vitamins-Minerals (CENTRUM SILVER PO) Take 1 tablet by mouth daily.    Marland Kitchen warfarin (COUMADIN) 5 MG tablet TAKE ONE & ONE-HALF TABLETS BY MOUTH ONCE DAILY OR  AS  DIRECTED  BY   THE  COUMADIN  CLINIC 100 tablet 1   No current facility-administered medications for this visit.     Objective: BP 136/80   Pulse 73   Temp 97.3 F (36.3 C) (Oral)   Ht 5\' 9"  (1.753 m)   Wt 163 lb 9.6 oz (74.2 kg)   SpO2 98%   BMI 24.16 kg/m  Gen: NAD, resting comfortably CV: RRR no murmurs rubs or gallops Lungs: CTAB no crackles, wheeze, rhonchi Abdomen: soft/nontender- other than very mild pain in LUQ with deep palpation/nondistended/normal bowel sounds. No rebound or guarding.  No flank pain or cva tenderness Ext: no edema  Results for orders placed or performed in visit on 02/27/17 (from the past 24 hour(s))  POCT Urinalysis Dipstick (Automated)     Status: None   Collection Time: 02/27/17  2:00 PM  Result Value Ref Range   Color, UA yellow    Clarity, UA slightly cloudy    Glucose, UA n    Bilirubin, UA n    Ketones, UA n    Spec Grav, UA 1.030 1.030 - 1.035   Blood, UA 3+    pH, UA 6.0 5.0 - 8.0   Protein, UA 1+    Urobilinogen, UA 0.2 Negative - 2.0   Nitrite, UA n    Leukocytes, UA Negative Negative   Assessment/Plan:  Urinary frequency - Plan: POCT Urinalysis Dipstick (Automated) Hematuria, unspecified type - Plan: Urine Microscopic S: frequency of urination and burning with peeing started yesterday. Pretty intense pain. Never had kidney stones. No fever, chills, back pain. Some mild groin pain. Not sexually  active.  A/P: Appears to be UTI. Appears to be uncomplicated other than age/male. Will treat with 7 days of bactrim DS. Get culture, gram stain, urine microscopic (hematuria but likely due to the UTI itself)  Bilateral leg pain S: Severe leg pain from knee down intermittently. Describes as cramps. Per patient and daughter has been on hydrocodone for years for this and will use #60 in about a year. They state strategies like spoonful of mustard have not been tried A/P: did provide #10 hydrocodone while Dr. Raliegh Ip is back though instructed further rx needed to  be from PCP.  I did advise patient to trial spoon of mustard when has cramps next time. I did not see an arthritc issue listed in problem list to suggest referred pain from spine.   Excess gas S: for months has had frequent and at times very loud flatulence. Patient does admit to getting constipated at times and using a stool softener which helps but returns when he stops this. He does not use daily because apparently had diarrhea severe in past with daily use.  A/P: advised every other day colace trial. Hopeful symptoms improve with regular bowel movements. Also discussed beano or gas x trial.   Orders Placed This Encounter  Procedures  . Urine culture    [facility]  . Gram stain  . Urine Microscopic Only  . POCT Urinalysis Dipstick (Automated)    Meds ordered this encounter  Medications  . sulfamethoxazole-trimethoprim (BACTRIM DS,SEPTRA DS) 800-160 MG tablet    Sig: Take 1 tablet by mouth 2 (two) times daily.    Dispense:  14 tablet    Refill:  0  . HYDROcodone-acetaminophen (NORCO/VICODIN) 5-325 MG tablet    Sig: Take 1 tablet by mouth every 6 (six) hours as needed for moderate pain.    Dispense:  10 tablet    Refill:  0    Return precautions advised.  Garret Reddish, MD

## 2017-02-27 NOTE — Telephone Encounter (Signed)
Grant Lawrence is aware to let the pt know that he needs an appointment with Dr. Raliegh Ip so that he is able to get his Hydrocodone refills.

## 2017-02-27 NOTE — Patient Instructions (Addendum)
Bactrim for UTI. Get culture to make sure you are on the right antibiotics. We may have to change antibiotic. If fever or worsening symptoms need to seek care  Would schedule colace/docusate every other day once a day as I wonder if constipation is contributing to the issue. Can also use Beano or gas-x trial.   #10 of pain medicine written- would schedule follow up with Dr. Raliegh Ip for more refills. Would also try spoonful of mustard when these cramps/leg pains occur

## 2017-02-27 NOTE — Progress Notes (Signed)
Pre visit review using our clinic review tool, if applicable. No additional management support is needed unless otherwise documented below in the visit note. 

## 2017-02-27 NOTE — Telephone Encounter (Signed)
Can we please schedule an appointment for this pt with Dr Raliegh Ip

## 2017-02-27 NOTE — Telephone Encounter (Signed)
Patient to start Bactrim DS today. Unable to come to clinic to check INR on Monday.   Recommendations:  HOLD warfarin x 1 dose today only  Take Bactrim DS as prescribed and take warfarin usual dose from 3/23 to 3/26, then check INR on 3/27 at coumadin clinic.

## 2017-02-28 LAB — GRAM STAIN: GRAM STAIN: NONE SEEN

## 2017-03-01 LAB — URINE CULTURE: Organism ID, Bacteria: NO GROWTH

## 2017-03-03 LAB — URINALYSIS, MICROSCOPIC ONLY: Yeast: NONE SEEN [HPF]

## 2017-03-04 ENCOUNTER — Ambulatory Visit (INDEPENDENT_AMBULATORY_CARE_PROVIDER_SITE_OTHER): Payer: Medicare Other | Admitting: Pharmacist

## 2017-03-04 ENCOUNTER — Telehealth: Payer: Self-pay | Admitting: Internal Medicine

## 2017-03-04 DIAGNOSIS — I482 Chronic atrial fibrillation: Secondary | ICD-10-CM

## 2017-03-04 DIAGNOSIS — I4891 Unspecified atrial fibrillation: Secondary | ICD-10-CM | POA: Diagnosis not present

## 2017-03-04 DIAGNOSIS — Z7901 Long term (current) use of anticoagulants: Secondary | ICD-10-CM | POA: Diagnosis not present

## 2017-03-04 DIAGNOSIS — I4821 Permanent atrial fibrillation: Secondary | ICD-10-CM

## 2017-03-04 LAB — POCT INR: INR: 2.8

## 2017-03-04 NOTE — Telephone Encounter (Signed)
Sarah, pt's dsaughter, returning your call.  She is with the wife

## 2017-03-05 NOTE — Telephone Encounter (Signed)
Spoke with patient yesterday.

## 2017-03-27 DIAGNOSIS — K5900 Constipation, unspecified: Secondary | ICD-10-CM | POA: Diagnosis not present

## 2017-03-27 DIAGNOSIS — R102 Pelvic and perineal pain: Secondary | ICD-10-CM | POA: Diagnosis not present

## 2017-03-27 LAB — HEPATIC FUNCTION PANEL
ALT: 22 U/L (ref 10–40)
AST: 25 U/L (ref 14–40)
Alkaline Phosphatase: 72 U/L (ref 25–125)
Bilirubin, Total: 1 mg/dL

## 2017-03-27 LAB — CBC AND DIFFERENTIAL
HCT: 39 % — AB (ref 41–53)
Hemoglobin: 13.2 g/dL — AB (ref 13.5–17.5)
Platelets: 137 10*3/uL — AB (ref 150–399)
WBC: 5 10^3/mL

## 2017-03-27 LAB — BASIC METABOLIC PANEL
BUN: 19 mg/dL (ref 4–21)
Creatinine: 1 mg/dL (ref 0.6–1.3)
Glucose: 104 mg/dL
POTASSIUM: 4.1 mmol/L (ref 3.4–5.3)
SODIUM: 141 mmol/L (ref 137–147)

## 2017-03-28 ENCOUNTER — Encounter: Payer: Self-pay | Admitting: Internal Medicine

## 2017-03-28 ENCOUNTER — Ambulatory Visit (INDEPENDENT_AMBULATORY_CARE_PROVIDER_SITE_OTHER): Payer: Medicare Other | Admitting: Internal Medicine

## 2017-03-28 VITALS — BP 124/70 | HR 54 | Temp 97.4°F | Ht 69.0 in | Wt 160.4 lb

## 2017-03-28 DIAGNOSIS — N138 Other obstructive and reflux uropathy: Secondary | ICD-10-CM | POA: Diagnosis not present

## 2017-03-28 DIAGNOSIS — Z7901 Long term (current) use of anticoagulants: Secondary | ICD-10-CM | POA: Diagnosis not present

## 2017-03-28 DIAGNOSIS — N401 Enlarged prostate with lower urinary tract symptoms: Secondary | ICD-10-CM

## 2017-03-28 DIAGNOSIS — R1030 Lower abdominal pain, unspecified: Secondary | ICD-10-CM | POA: Diagnosis not present

## 2017-03-28 MED ORDER — CIPROFLOXACIN HCL 500 MG PO TABS: 500.0000 mg | ORAL_TABLET | Freq: Two times a day (BID) | ORAL | 0 refills | Status: DC

## 2017-03-28 MED ORDER — TAMSULOSIN HCL 0.4 MG PO CAPS: 0.4000 mg | ORAL_CAPSULE | Freq: Every day | ORAL | 3 refills | Status: DC

## 2017-03-28 NOTE — Progress Notes (Signed)
Pre visit review using our clinic review tool, if applicable. No additional management support is needed unless otherwise documented below in the visit note. 

## 2017-03-28 NOTE — Progress Notes (Signed)
Subjective:    Patient ID: Grant Lawrence, male    DOB: [birth year], 83 y.o.   MRN: [id number]  HPI  83 year old patient with a long history of BPH.  For the past month he has had some lower urinary tract symptoms including pain, voiding difficulty, burning.  More recently his chief complaint has been discomfort.  He describes pain in the lower midline abdomen with some radiation to the back.  He has some occasional incontinence.  He was seen by another provider.  One month ago and treated for possible UTI.  Subsequent  urine culture was negative. He was seen by GI recently who did not feel this was a primary GI issue. The patient has atrial fibrillation and remains on warfarin anticoagulation  Past Medical History:  Diagnosis Date  . CHF (congestive heart failure) ([place])   . Coronary artery disease   . Hyperlipidemia   . Hypertension      Social History   Social History  . Marital status: Married    Spouse name: N/A  . Number of children: N/A  . Years of education: N/A   Occupational History  . Not on file.   Social History Main Topics  . Smoking status: Never Smoker  . Smokeless tobacco: Never Used  . Alcohol use No  . Drug use: No  . Sexual activity: Not on file   Other Topics Concern  . Not on file   Social History Narrative  . No narrative on file    Past Surgical History:  Procedure Laterality Date  . CARDIAC DEFIBRILLATOR PLACEMENT  09/26/2009  . CORONARY ARTERY BYPASS GRAFT  2005  . CORONARY ARTERY BYPASS GRAFT    . IMPLANTABLE CARDIOVERTER DEFIBRILLATOR (ICD) GENERATOR CHANGE N/A 08/23/2014   Procedure: ICD GENERATOR CHANGE;  Surgeon: Sanda Klein, MD;  Location: [facility] CATH [facility];  Service: Cardiovascular;  Laterality: N/A;  . MITRAL VALVE REPAIR (MV)/CORONARY ARTERY BYPASS GRAFTING (CABG)    . MITRAL VALVULOPLASTY  2005    Family History  Problem Relation Age of Onset  . Cancer Father   . Diabetes Brother     Allergies  Allergen Reactions  .  Rosuvastatin Calcium     Myalgia  . Statins     Myalgia    Current Outpatient Prescriptions on File Prior to Visit  Medication Sig Dispense Refill  . amoxicillin (AMOXIL) 500 MG capsule Take 2,000 mg by mouth. For dental procedures    . carvedilol (COREG) 3.125 MG tablet TAKE ONE TABLET BY MOUTH TWICE DAILY WITH MEALS 180 tablet 3  . ezetimibe (ZETIA) 10 MG tablet Take 1 tablet (10 mg total) by mouth daily. 90 tablet 3  . HYDROcodone-acetaminophen (NORCO/VICODIN) 5-325 MG tablet Take 1 tablet by mouth every 6 (six) hours as needed for moderate pain. 10 tablet 0  . lisinopril (PRINIVIL,ZESTRIL) 5 MG tablet TAKE ONE TABLET BY MOUTH TWICE DAILY 180 tablet 1  . Multiple Vitamins-Minerals (CENTRUM SILVER PO) Take 1 tablet by mouth daily.    Marland Kitchen warfarin (COUMADIN) 5 MG tablet TAKE ONE & ONE-HALF TABLETS BY MOUTH ONCE DAILY OR  AS  DIRECTED  BY  THE  COUMADIN  CLINIC 100 tablet 1   No current facility-administered medications on file prior to visit.     BP 124/70 (BP Location: Left Arm, Patient Position: Sitting, Cuff Size: Normal)   Pulse (!) 54   Temp 97.4 F (36.3 C) (Oral)   Ht 5\' 9"  (1.753 m)   Wt 160 lb 6.4 oz (72.8  kg)   SpO2 98%   BMI 23.69 kg/m     Review of Systems  Constitutional: Positive for activity change and appetite change.  Gastrointestinal: Positive for abdominal pain.  Genitourinary: Positive for difficulty urinating, dysuria and frequency.       Objective:   Physical Exam  Constitutional: He appears well-developed and well-nourished. No distress.  Cardiovascular: Normal rate.   Pulmonary/Chest: Effort normal and breath sounds normal.  Abdominal: Soft. Bowel sounds are normal.  Lower abdominal discomfort No obvious bladder distention  Genitourinary:  Genitourinary Comments: Prostate markedly enlarged, smooth and fairly symmetrical Stool heme-negative          Assessment & Plan:   Marked BPH with lower urinary tract and obstructive symptoms.  Will  place on Flomax and also treat for possible subacute prostatitis.  Will refer to urology Chronic warfarin anticoagulation.  Patient be placed on Cipro.  Due to drug drug interaction.  Patient will have weekly INRs checked with the next 3 weeks Patient and daughter made aware Urology  appointment placed  [facility]

## 2017-03-28 NOTE — Patient Instructions (Signed)
Take your antibiotic as prescribed until ALL of it is gone, but stop if you develop a rash, swelling, or any side effects of the medication.  Contact our office as soon as possible if  there are side effects of the medication.  Flomax one tablet daily  Due to a possible drug drug interaction with Cipro, please have your INR checked weekly while on this antibiotic  Return in one month for follow-up

## 2017-03-31 ENCOUNTER — Telehealth: Payer: Self-pay | Admitting: Cardiovascular Disease

## 2017-03-31 NOTE — Telephone Encounter (Signed)
New message    Pt daughter is calling to schedule appt with coumadin clinic because pt is on antibiotic. She said she was told he would need his INR checked every week. She scheduled Wednesday at 8am, but is asking if there is any way she can come in at like 10? All the times are on hold.

## 2017-03-31 NOTE — Telephone Encounter (Signed)
On Friday started Cipro 500 mg bid - was told would need weekly INR checks.  Spoke with daughter, will have him come into office Tuesday at 10:30 for INR check, can determine from there whether would need further checks.  Daughter voiced understanding

## 2017-04-01 ENCOUNTER — Ambulatory Visit (INDEPENDENT_AMBULATORY_CARE_PROVIDER_SITE_OTHER): Payer: Medicare Other | Admitting: Pharmacist Clinician (PhC)/ Clinical Pharmacy Specialist

## 2017-04-01 DIAGNOSIS — Z7901 Long term (current) use of anticoagulants: Secondary | ICD-10-CM

## 2017-04-01 DIAGNOSIS — I4891 Unspecified atrial fibrillation: Secondary | ICD-10-CM | POA: Diagnosis not present

## 2017-04-01 DIAGNOSIS — I482 Chronic atrial fibrillation: Secondary | ICD-10-CM

## 2017-04-01 DIAGNOSIS — I4821 Permanent atrial fibrillation: Secondary | ICD-10-CM

## 2017-04-01 LAB — POCT INR: INR: 3.6

## 2017-04-03 ENCOUNTER — Telehealth: Payer: Self-pay | Admitting: Cardiology

## 2017-04-03 ENCOUNTER — Ambulatory Visit (INDEPENDENT_AMBULATORY_CARE_PROVIDER_SITE_OTHER): Payer: Medicare Other | Admitting: *Deleted

## 2017-04-03 DIAGNOSIS — I5032 Chronic diastolic (congestive) heart failure: Secondary | ICD-10-CM

## 2017-04-03 NOTE — Telephone Encounter (Signed)
Confirmed remote transmission w/ pt daughter.   

## 2017-04-03 NOTE — Progress Notes (Signed)
Remote ICD transmission.   

## 2017-04-04 ENCOUNTER — Encounter: Payer: Self-pay | Admitting: Cardiology

## 2017-04-04 LAB — CUP PACEART REMOTE DEVICE CHECK
Battery Remaining Longevity: 20 mo
Battery Remaining Percentage: 39 %
Battery Voltage: 2.87 V
Date Time Interrogation Session: 20180426041426
HIGH POWER IMPEDANCE MEASURED VALUE: 40 Ohm
HighPow Impedance: 39 Ohm
Implantable Lead Implant Date: 20101019
Implantable Lead Implant Date: 20101019
Implantable Lead Location: 753858
Implantable Lead Location: 753859
Implantable Pulse Generator Implant Date: 20150915
Lead Channel Impedance Value: 400 Ohm
Lead Channel Pacing Threshold Amplitude: 0.75 V
Lead Channel Pacing Threshold Pulse Width: 1.2 ms
Lead Channel Sensing Intrinsic Amplitude: 11.4 mV
Lead Channel Setting Pacing Amplitude: 2.5 V
Lead Channel Setting Pacing Pulse Width: 1.2 ms
MDC IDC LEAD IMPLANT DT: 20101019
MDC IDC LEAD LOCATION: 753860
MDC IDC MSMT LEADCHNL LV IMPEDANCE VALUE: 460 Ohm
MDC IDC MSMT LEADCHNL LV PACING THRESHOLD AMPLITUDE: 1.875 V
MDC IDC MSMT LEADCHNL RV PACING THRESHOLD PULSEWIDTH: 0.5 ms
MDC IDC SET LEADCHNL LV PACING AMPLITUDE: 2.375
MDC IDC SET LEADCHNL RV PACING PULSEWIDTH: 0.5 ms
MDC IDC SET LEADCHNL RV SENSING SENSITIVITY: 2 mV
Pulse Gen Serial Number: 7207729

## 2017-04-07 ENCOUNTER — Encounter: Payer: Self-pay | Admitting: Family Medicine

## 2017-04-11 ENCOUNTER — Ambulatory Visit (INDEPENDENT_AMBULATORY_CARE_PROVIDER_SITE_OTHER): Payer: Medicare Other | Admitting: Internal Medicine

## 2017-04-11 ENCOUNTER — Encounter: Payer: Self-pay | Admitting: Internal Medicine

## 2017-04-11 VITALS — BP 132/68 | HR 76 | Temp 97.5°F | Ht 69.0 in | Wt 161.2 lb

## 2017-04-11 DIAGNOSIS — I4821 Permanent atrial fibrillation: Secondary | ICD-10-CM

## 2017-04-11 DIAGNOSIS — I482 Chronic atrial fibrillation: Secondary | ICD-10-CM | POA: Diagnosis not present

## 2017-04-11 DIAGNOSIS — K4090 Unilateral inguinal hernia, without obstruction or gangrene, not specified as recurrent: Secondary | ICD-10-CM

## 2017-04-11 MED ORDER — HYDROCODONE-ACETAMINOPHEN 5-325 MG PO TABS: 1.0000 | ORAL_TABLET | Freq: Four times a day (QID) | ORAL | 0 refills | Status: DC | PRN

## 2017-04-11 NOTE — Progress Notes (Signed)
Subjective:    Patient ID: Grant Lawrence, male    DOB: 1931-03-12, 83 y.o.   MRN: [id number]  HPI  83 year old patient who has history of BPH and was seen recently with lower urinary tract obstructive symptoms.  He now is on Flomax and is improved.  He is scheduled to see Urology. He presents today with a new complaint.  For the past 1 or 2 weeks he has had pain and swelling involving the left groin area  Past Medical History:  Diagnosis Date  . CHF (congestive heart failure) ([place])   . Coronary artery disease   . Hyperlipidemia   . Hypertension      Social History   Social History  . Marital status: Married    Spouse name: N/A  . Number of children: N/A  . Years of education: N/A   Occupational History  . Not on file.   Social History Main Topics  . Smoking status: Never Smoker  . Smokeless tobacco: Never Used  . Alcohol use No  . Drug use: No  . Sexual activity: Not on file   Other Topics Concern  . Not on file   Social History Narrative  . No narrative on file    Past Surgical History:  Procedure Laterality Date  . CARDIAC DEFIBRILLATOR PLACEMENT  09/26/2009  . CORONARY ARTERY BYPASS GRAFT  2005  . CORONARY ARTERY BYPASS GRAFT    . IMPLANTABLE CARDIOVERTER DEFIBRILLATOR (ICD) GENERATOR CHANGE N/A 08/23/2014   Procedure: ICD GENERATOR CHANGE;  Surgeon: Sanda Klein, MD;  Location: [place] CATH LAB;  Service: Cardiovascular;  Laterality: N/A;  . MITRAL VALVE REPAIR (MV)/CORONARY ARTERY BYPASS GRAFTING (CABG)    . MITRAL VALVULOPLASTY  2005    Family History  Problem Relation Age of Onset  . Cancer Father   . Diabetes Brother     Allergies  Allergen Reactions  . Rosuvastatin Calcium     Myalgia  . Statins     Myalgia    Current Outpatient Prescriptions on File Prior to Visit  Medication Sig Dispense Refill  . carvedilol (COREG) 3.125 MG tablet TAKE ONE TABLET BY MOUTH TWICE DAILY WITH MEALS 180 tablet 3  . ezetimibe (ZETIA) 10 MG tablet Take 1  tablet (10 mg total) by mouth daily. 90 tablet 3  . lisinopril (PRINIVIL,ZESTRIL) 5 MG tablet TAKE ONE TABLET BY MOUTH TWICE DAILY 180 tablet 1  . Multiple Vitamins-Minerals (CENTRUM SILVER PO) Take 1 tablet by mouth daily.    Marland Kitchen warfarin (COUMADIN) 5 MG tablet TAKE ONE & ONE-HALF TABLETS BY MOUTH ONCE DAILY OR  AS  DIRECTED  BY  THE  COUMADIN  CLINIC 100 tablet 1   No current facility-administered medications on file prior to visit.     BP 132/68 (BP Location: Left Arm, Patient Position: Sitting, Cuff Size: Normal)   Pulse 76   Temp 97.5 F (36.4 C) (Oral)   Ht 5\' 9"  (1.753 m)   Wt 161 lb 3.2 oz (73.1 kg)   SpO2 98%   BMI 23.81 kg/m             Review of Systems  Constitutional: Negative for appetite change, chills, fatigue and fever.  HENT: Negative for congestion, dental problem, ear pain, hearing loss, sore throat, tinnitus, trouble swallowing and voice change.   Eyes: Negative for pain, discharge and visual disturbance.  Respiratory: Negative for cough, chest tightness, wheezing and stridor.   Cardiovascular: Negative for chest pain, palpitations and leg swelling.  Gastrointestinal: Negative  for abdominal distention, abdominal pain, blood in stool, constipation, diarrhea, nausea and vomiting.       Swelling left groin  Genitourinary: Positive for decreased urine volume and frequency. Negative for difficulty urinating, discharge, flank pain, genital sores, hematuria and urgency.  Musculoskeletal: Negative for arthralgias, back pain, gait problem, joint swelling, myalgias and neck stiffness.  Skin: Negative for rash.  Neurological: Negative for dizziness, syncope, speech difficulty, weakness, numbness and headaches.  Hematological: Negative for adenopathy. Does not bruise/bleed easily.  Psychiatric/Behavioral: Negative for behavioral problems and dysphoric mood. The patient is not nervous/anxious.        Objective:   Physical Exam  Constitutional: He appears  well-developed and well-nourished. No distress.  Abdominal:  Moderate sized left inguinal hernia that was easily reduced          Assessment & Plan:   Reducible left inguinal hernia.  Patient information dispensed.  Will report any worsening pain or other symptoms BPH.  Follow-up urology  [name]

## 2017-04-11 NOTE — Patient Instructions (Addendum)
Hernia A hernia happens when an organ or tissue inside your body pushes out through a weak spot in the belly (abdomen). Follow these instructions at home:  Avoid stretching or overusing (straining) the muscles near the hernia.  Do not lift anything heavier than 10 lb (4.5 kg).  Use the muscles in your leg when you lift something up. Do not use the muscles in your back.  When you cough, try to cough gently.  Eat a diet that has a lot of fiber. Eat lots of fruits and vegetables.  Drink enough fluids to keep your pee (urine) clear or pale yellow. Try to drink 6-8 glasses of water a day.  Take medicines to make your poop soft (stool softeners) as told by your doctor.  Lose weight, if you are overweight.  Do not use any tobacco products, including cigarettes, chewing tobacco, or electronic cigarettes. If you need help quitting, ask your doctor.  Keep all follow-up visits as told by your doctor. This is important. Contact a doctor if:  The skin by the hernia gets puffy (swollen) or red.  The hernia is painful. Get help right away if:  You have a fever.  You have belly pain that is getting worse.  You feel sick to your stomach (nauseous) or you throw up (vomit).  You cannot push the hernia back in place by gently pressing on it while you are lying down.  The hernia:  Changes in shape or size.  Is stuck outside your belly.  Changes color.  Feels hard or tender. This information is not intended to replace advice given to you by your health care provider. Make sure you discuss any questions you have with your health care provider. Document Released: 05/15/2010 Document Revised: 05/02/2016 Document Reviewed: 10/05/2014 Elsevier Interactive Patient Education  2017 [employer].   Urology follow-up as scheduled  Return in 6 months for follow-up

## 2017-04-11 NOTE — Progress Notes (Signed)
Pre visit review using our clinic review tool, if applicable. No additional management support is needed unless otherwise documented below in the visit note. 

## 2017-04-18 ENCOUNTER — Ambulatory Visit (INDEPENDENT_AMBULATORY_CARE_PROVIDER_SITE_OTHER): Payer: Medicare Other | Admitting: Pharmacist Clinician (PhC)/ Clinical Pharmacy Specialist

## 2017-04-18 DIAGNOSIS — I4891 Unspecified atrial fibrillation: Secondary | ICD-10-CM

## 2017-04-18 DIAGNOSIS — Z7901 Long term (current) use of anticoagulants: Secondary | ICD-10-CM

## 2017-04-18 DIAGNOSIS — I482 Chronic atrial fibrillation: Secondary | ICD-10-CM

## 2017-04-18 DIAGNOSIS — I4821 Permanent atrial fibrillation: Secondary | ICD-10-CM

## 2017-04-18 LAB — POCT INR: INR: 2.5

## 2017-04-23 ENCOUNTER — Ambulatory Visit: Payer: Medicare Other | Admitting: Internal Medicine

## 2017-04-25 ENCOUNTER — Encounter: Payer: Self-pay | Admitting: Internal Medicine

## 2017-04-25 ENCOUNTER — Ambulatory Visit (INDEPENDENT_AMBULATORY_CARE_PROVIDER_SITE_OTHER): Payer: Medicare Other | Admitting: Internal Medicine

## 2017-04-25 VITALS — BP 132/72 | HR 70 | Temp 97.8°F | Ht 69.0 in | Wt 160.6 lb

## 2017-04-25 DIAGNOSIS — H6121 Impacted cerumen, right ear: Secondary | ICD-10-CM

## 2017-04-25 DIAGNOSIS — I10 Essential (primary) hypertension: Secondary | ICD-10-CM | POA: Diagnosis not present

## 2017-04-25 DIAGNOSIS — I5032 Chronic diastolic (congestive) heart failure: Secondary | ICD-10-CM

## 2017-04-25 DIAGNOSIS — I482 Chronic atrial fibrillation: Secondary | ICD-10-CM | POA: Diagnosis not present

## 2017-04-25 DIAGNOSIS — I4821 Permanent atrial fibrillation: Secondary | ICD-10-CM

## 2017-04-25 NOTE — Progress Notes (Signed)
Subjective:    Patient ID: Grant Lawrence, male    DOB: 11-08-1931, 83 y.o.   MRN: [id number]  HPI 83 year old patient who has a history of essential hypertension, coronary artery disease and chronic diastolic heart failure. He presents today with a complaint of right ear discomfort for about a week and a half.  He states when he places his hearing aids.  He notes discomfort in the right ear.  No drainage  Past Medical History:  Diagnosis Date  . CHF (congestive heart failure) ([place])   . Coronary artery disease   . Hyperlipidemia   . Hypertension      Social History   Social History  . Marital status: Married    Spouse name: N/A  . Number of children: N/A  . Years of education: N/A   Occupational History  . Not on file.   Social History Main Topics  . Smoking status: Never Smoker  . Smokeless tobacco: Never Used  . Alcohol use No  . Drug use: No  . Sexual activity: Not on file   Other Topics Concern  . Not on file   Social History Narrative  . No narrative on file    Past Surgical History:  Procedure Laterality Date  . CARDIAC DEFIBRILLATOR PLACEMENT  09/26/2009  . CORONARY ARTERY BYPASS GRAFT  2005  . CORONARY ARTERY BYPASS GRAFT    . IMPLANTABLE CARDIOVERTER DEFIBRILLATOR (ICD) GENERATOR CHANGE N/A 08/23/2014   Procedure: ICD GENERATOR CHANGE;  Surgeon: Sanda Klein, MD;  Location: [facility] CATH LAB;  Service: Cardiovascular;  Laterality: N/A;  . MITRAL VALVE REPAIR (MV)/CORONARY ARTERY BYPASS GRAFTING (CABG)    . MITRAL VALVULOPLASTY  2005    Family History  Problem Relation Age of Onset  . Cancer Father   . Diabetes Brother     Allergies  Allergen Reactions  . Rosuvastatin Calcium     Myalgia  . Statins     Myalgia    Current Outpatient Prescriptions on File Prior to Visit  Medication Sig Dispense Refill  . carvedilol (COREG) 3.125 MG tablet TAKE ONE TABLET BY MOUTH TWICE DAILY WITH MEALS 180 tablet 3  . ezetimibe (ZETIA) 10 MG tablet Take 1  tablet (10 mg total) by mouth daily. 90 tablet 3  . HYDROcodone-acetaminophen (NORCO/VICODIN) 5-325 MG tablet Take 1 tablet by mouth every 6 (six) hours as needed for moderate pain. 30 tablet 0  . lisinopril (PRINIVIL,ZESTRIL) 5 MG tablet TAKE ONE TABLET BY MOUTH TWICE DAILY 180 tablet 1  . Multiple Vitamins-Minerals (CENTRUM SILVER PO) Take 1 tablet by mouth daily.    Marland Kitchen warfarin (COUMADIN) 5 MG tablet TAKE ONE & ONE-HALF TABLETS BY MOUTH ONCE DAILY OR  AS  DIRECTED  BY  THE  COUMADIN  CLINIC 100 tablet 1   No current facility-administered medications on file prior to visit.     BP 132/72 (BP Location: Left Arm, Patient Position: Sitting, Cuff Size: Normal)   Pulse 70   Temp 97.8 F (36.6 C) (Oral)   Ht 5\' 9"  (1.753 m)   Wt 160 lb 9.6 oz (72.8 kg)   SpO2 99%   BMI 23.72 kg/m      Review of Systems  Constitutional: Negative for appetite change, chills, fatigue and fever.  HENT: Positive for ear pain and hearing loss. Negative for congestion, dental problem, sore throat, tinnitus, trouble swallowing and voice change.   Eyes: Negative for pain, discharge and visual disturbance.  Respiratory: Negative for cough, chest tightness, wheezing and stridor.  Cardiovascular: Negative for chest pain, palpitations and leg swelling.  Gastrointestinal: Negative for abdominal distention, abdominal pain, blood in stool, constipation, diarrhea, nausea and vomiting.  Genitourinary: Negative for difficulty urinating, discharge, flank pain, genital sores, hematuria and urgency.  Musculoskeletal: Negative for arthralgias, back pain, gait problem, joint swelling, myalgias and neck stiffness.  Skin: Negative for rash.  Neurological: Negative for dizziness, syncope, speech difficulty, weakness, numbness and headaches.  Hematological: Negative for adenopathy. Does not bruise/bleed easily.  Psychiatric/Behavioral: Negative for behavioral problems and dysphoric mood. The patient is not nervous/anxious.         Objective:   Physical Exam  Constitutional: He appears well-developed and well-nourished. No distress.  HENT:  Considerable wax in the right canal          Assessment & Plan:   Cerumen impaction, right ear Essential hypertension, stable  Right canal irrigated until clear  [name]

## 2017-04-25 NOTE — Patient Instructions (Addendum)
WE NOW OFFER   [facility] FAST TRACK!!!  SAME DAY Appointments for ACUTE CARE  Such as: Sprains, Injuries, cuts, abrasions, rashes, muscle pain, joint pain, back pain Colds, flu, sore throats, headache, allergies, cough, fever  Ear pain, sinus and eye infections Abdominal pain, nausea, vomiting, diarrhea, upset stomach Animal/insect bites  3 Easy Ways to Schedule: Walk-In Scheduling Call in scheduling Mychart Sign-up: [email or web address]         Earwax Buildup Your ears make a substance called earwax. It may also be called cerumen. Sometimes, too much earwax builds up in your ear canal. This can cause ear pain and make it harder for you to hear. CAUSES This condition is caused by too much earwax production or buildup. RISK FACTORS The following factors may make you more likely to develop this condition:  Cleaning your ears often with swabs.  Having narrow ear canals.  Having earwax that is overly thick or sticky.  Having eczema.  Being dehydrated. SYMPTOMS Symptoms of this condition include:  Reduced hearing.  Ear drainage.  Ear pain.  Ear itch.  A feeling of fullness in the ear or feeling that the ear is plugged.  Ringing in the ear.  Coughing. DIAGNOSIS Your health care provider can diagnose this condition based on your symptoms and medical history. Your health care provider will also do an ear exam to look inside your ear with a scope (otoscope). You may also have a hearing test. TREATMENT Treatment for this condition includes:  Over-the-counter or prescription ear drops to soften the earwax.  Earwax removal by a health care provider. This may be done:  By flushing the ear with body-temperature water.  With a medical instrument that has a loop at the end (earwax curette).  With a suction device. HOME CARE INSTRUCTIONS  Take over-the-counter and prescription medicines only as told by your health care provider.  Do not  put any objects, including an ear swab, into your ear. You can clean the opening of your ear canal with a washcloth.  Drink enough water to keep your urine clear or pale yellow.  If you have frequent earwax buildup or you use hearing aids, consider seeing your health care provider every 6-12 months for routine preventive ear cleanings. Keep all follow-up visits as told by your health care provider. SEEK MEDICAL CARE IF:  You have ear pain.  Your condition does not improve with treatment.  You have hearing loss.  You have blood, pus, or other fluid coming from your ear. This information is not intended to replace advice given to you by your health care provider. Make sure you discuss any questions you have with your health care provider. Document Released: 01/02/2005 Document Revised: 03/18/2016 Document Reviewed: 07/12/2015 Elsevier Interactive Patient Education  2017 [employer].

## 2017-05-03 ENCOUNTER — Other Ambulatory Visit: Payer: Self-pay | Admitting: Internal Medicine

## 2017-05-09 ENCOUNTER — Ambulatory Visit (INDEPENDENT_AMBULATORY_CARE_PROVIDER_SITE_OTHER): Payer: Medicare Other | Admitting: Pharmacist

## 2017-05-09 DIAGNOSIS — I4891 Unspecified atrial fibrillation: Secondary | ICD-10-CM

## 2017-05-09 DIAGNOSIS — I4821 Permanent atrial fibrillation: Secondary | ICD-10-CM

## 2017-05-09 DIAGNOSIS — Z7901 Long term (current) use of anticoagulants: Secondary | ICD-10-CM | POA: Diagnosis not present

## 2017-05-09 DIAGNOSIS — I482 Chronic atrial fibrillation: Secondary | ICD-10-CM | POA: Diagnosis not present

## 2017-05-09 LAB — POCT INR: INR: 1.8

## 2017-05-15 ENCOUNTER — Other Ambulatory Visit: Payer: Self-pay | Admitting: Pharmacist

## 2017-05-15 MED ORDER — WARFARIN SODIUM 5 MG PO TABS: ORAL_TABLET | ORAL | 1 refills | Status: DC

## 2017-05-16 DIAGNOSIS — H18413 Arcus senilis, bilateral: Secondary | ICD-10-CM | POA: Diagnosis not present

## 2017-05-16 DIAGNOSIS — H2511 Age-related nuclear cataract, right eye: Secondary | ICD-10-CM | POA: Diagnosis not present

## 2017-05-16 DIAGNOSIS — H25043 Posterior subcapsular polar age-related cataract, bilateral: Secondary | ICD-10-CM | POA: Diagnosis not present

## 2017-05-16 DIAGNOSIS — H25013 Cortical age-related cataract, bilateral: Secondary | ICD-10-CM | POA: Diagnosis not present

## 2017-05-16 DIAGNOSIS — H2513 Age-related nuclear cataract, bilateral: Secondary | ICD-10-CM | POA: Diagnosis not present

## 2017-05-19 ENCOUNTER — Other Ambulatory Visit: Payer: Self-pay | Admitting: *Deleted

## 2017-05-19 MED ORDER — LISINOPRIL 5 MG PO TABS: 5.0000 mg | ORAL_TABLET | Freq: Two times a day (BID) | ORAL | 1 refills | Status: DC

## 2017-05-19 NOTE — Telephone Encounter (Signed)
Rx done. 

## 2017-05-27 DIAGNOSIS — H401233 Low-tension glaucoma, bilateral, severe stage: Secondary | ICD-10-CM | POA: Diagnosis not present

## 2017-05-27 DIAGNOSIS — H401133 Primary open-angle glaucoma, bilateral, severe stage: Secondary | ICD-10-CM | POA: Diagnosis not present

## 2017-05-29 ENCOUNTER — Ambulatory Visit (INDEPENDENT_AMBULATORY_CARE_PROVIDER_SITE_OTHER): Payer: Medicare Other | Admitting: Pharmacist

## 2017-05-29 DIAGNOSIS — Z7901 Long term (current) use of anticoagulants: Secondary | ICD-10-CM

## 2017-05-29 DIAGNOSIS — I4821 Permanent atrial fibrillation: Secondary | ICD-10-CM

## 2017-05-29 DIAGNOSIS — I482 Chronic atrial fibrillation: Secondary | ICD-10-CM

## 2017-05-29 DIAGNOSIS — I4891 Unspecified atrial fibrillation: Secondary | ICD-10-CM

## 2017-05-29 LAB — POCT INR: INR: 1.9

## 2017-05-30 DIAGNOSIS — R972 Elevated prostate specific antigen [PSA]: Secondary | ICD-10-CM | POA: Diagnosis not present

## 2017-05-30 DIAGNOSIS — R3911 Hesitancy of micturition: Secondary | ICD-10-CM | POA: Diagnosis not present

## 2017-05-30 DIAGNOSIS — N401 Enlarged prostate with lower urinary tract symptoms: Secondary | ICD-10-CM | POA: Diagnosis not present

## 2017-06-04 ENCOUNTER — Encounter: Payer: Self-pay | Admitting: Family Medicine

## 2017-06-04 DIAGNOSIS — G9389 Other specified disorders of brain: Secondary | ICD-10-CM | POA: Diagnosis not present

## 2017-06-04 DIAGNOSIS — J322 Chronic ethmoidal sinusitis: Secondary | ICD-10-CM | POA: Diagnosis not present

## 2017-06-04 DIAGNOSIS — I639 Cerebral infarction, unspecified: Secondary | ICD-10-CM | POA: Diagnosis not present

## 2017-06-04 DIAGNOSIS — H409 Unspecified glaucoma: Secondary | ICD-10-CM | POA: Diagnosis not present

## 2017-06-04 DIAGNOSIS — H401233 Low-tension glaucoma, bilateral, severe stage: Secondary | ICD-10-CM | POA: Diagnosis not present

## 2017-06-04 DIAGNOSIS — I69398 Other sequelae of cerebral infarction: Secondary | ICD-10-CM | POA: Diagnosis not present

## 2017-06-04 DIAGNOSIS — J323 Chronic sphenoidal sinusitis: Secondary | ICD-10-CM | POA: Diagnosis not present

## 2017-06-04 DIAGNOSIS — Z9889 Other specified postprocedural states: Secondary | ICD-10-CM | POA: Diagnosis not present

## 2017-06-27 ENCOUNTER — Ambulatory Visit (INDEPENDENT_AMBULATORY_CARE_PROVIDER_SITE_OTHER): Payer: Medicare Other | Admitting: Pharmacist

## 2017-06-27 DIAGNOSIS — I4891 Unspecified atrial fibrillation: Secondary | ICD-10-CM

## 2017-06-27 DIAGNOSIS — I4821 Permanent atrial fibrillation: Secondary | ICD-10-CM

## 2017-06-27 DIAGNOSIS — Z7901 Long term (current) use of anticoagulants: Secondary | ICD-10-CM

## 2017-06-27 DIAGNOSIS — I482 Chronic atrial fibrillation: Secondary | ICD-10-CM | POA: Diagnosis not present

## 2017-06-27 LAB — POCT INR: INR: 3.3

## 2017-07-03 ENCOUNTER — Telehealth: Payer: Self-pay | Admitting: Cardiology

## 2017-07-03 ENCOUNTER — Encounter: Payer: Medicare Other | Admitting: *Deleted

## 2017-07-03 NOTE — Telephone Encounter (Signed)
Confirmed remote transmission w/ pt wife.   

## 2017-07-04 ENCOUNTER — Encounter: Payer: Self-pay | Admitting: Cardiology

## 2017-07-25 ENCOUNTER — Ambulatory Visit (INDEPENDENT_AMBULATORY_CARE_PROVIDER_SITE_OTHER): Payer: Medicare Other | Admitting: Pharmacist

## 2017-07-25 DIAGNOSIS — I482 Chronic atrial fibrillation: Secondary | ICD-10-CM | POA: Diagnosis not present

## 2017-07-25 DIAGNOSIS — I4821 Permanent atrial fibrillation: Secondary | ICD-10-CM

## 2017-07-25 DIAGNOSIS — I4891 Unspecified atrial fibrillation: Secondary | ICD-10-CM

## 2017-07-25 DIAGNOSIS — Z7901 Long term (current) use of anticoagulants: Secondary | ICD-10-CM

## 2017-07-25 LAB — POCT INR: INR: 2

## 2017-07-31 ENCOUNTER — Encounter: Payer: Self-pay | Admitting: Cardiovascular Disease

## 2017-07-31 NOTE — Progress Notes (Unsigned)
His daughter, [name], reports that Mr. Trumbull has stopped walking because of dizziness. He feels very unsteady and lightheaded. He has complete heart block and permanent atrial fibrillation with a biventricular pacemaker that was working normally at last [employer]. There was no evidence of high ventricular rates. He has had problems with orthostatic hypotension in the past. Will decrease the lisinopril to 5 mg once daily in the evening only. We'll plan to check his blood pressure through the anticoagulation clinic. If the current change medication does not lead to symptom improvement, will bring him in for an earlier appointment.  Sanda Klein, MD, [facility] [phone] office [phone] pager

## 2017-08-22 ENCOUNTER — Telehealth: Payer: Self-pay | Admitting: Pharmacist Clinician (PhC)/ Clinical Pharmacy Specialist

## 2017-08-22 ENCOUNTER — Ambulatory Visit (INDEPENDENT_AMBULATORY_CARE_PROVIDER_SITE_OTHER): Payer: Medicare Other | Admitting: Pharmacist Clinician (PhC)/ Clinical Pharmacy Specialist

## 2017-08-22 DIAGNOSIS — Z7901 Long term (current) use of anticoagulants: Secondary | ICD-10-CM | POA: Diagnosis not present

## 2017-08-22 DIAGNOSIS — I4891 Unspecified atrial fibrillation: Secondary | ICD-10-CM | POA: Diagnosis not present

## 2017-08-22 DIAGNOSIS — I482 Chronic atrial fibrillation: Secondary | ICD-10-CM | POA: Diagnosis not present

## 2017-08-22 DIAGNOSIS — I4821 Permanent atrial fibrillation: Secondary | ICD-10-CM

## 2017-08-22 LAB — POCT INR: INR: 1.9

## 2017-08-22 NOTE — Telephone Encounter (Signed)
Patient daughter called 3 weeks ago with concern that patient was experiencing increased dizziness. Chart notes previous problems with orthostatic hypotension.  Lisinopril dose was cut from 5 mg id to 5 mg qhs.    Checked BP in office today.  Patient reports still some dizziness on occasion, but not a good historian.  Checked both seated and standing pressure in office.  Seated 130/68, standing (after 1 minute) 128/76.   Patient was unstable when he stood, had to hold on to both my arm as well as daughter.   No reported falls at home.    Advised daughter to leave medication unchanged and will forward this information to Dr. Loletha Grayer

## 2017-08-22 NOTE — Telephone Encounter (Signed)
It sounds like his instability is not due to his BP. Please ask him to discuss with his PCP or we can refer to a Neurologist. MCr

## 2017-08-23 ENCOUNTER — Other Ambulatory Visit: Payer: Self-pay | Admitting: Internal Medicine

## 2017-08-26 NOTE — Telephone Encounter (Signed)
Spoke with daughter - explained that BP was stable in office despite patient being somewhat unstable on his feet.  She will contact PCP and if they feel neurology consult is needed we can help with that.   Daughter voiced understanding

## 2017-09-09 ENCOUNTER — Ambulatory Visit (INDEPENDENT_AMBULATORY_CARE_PROVIDER_SITE_OTHER): Payer: Medicare Other | Admitting: Internal Medicine

## 2017-09-09 ENCOUNTER — Encounter: Payer: Self-pay | Admitting: Internal Medicine

## 2017-09-09 VITALS — BP 148/70 | HR 72 | Ht 69.0 in | Wt 166.2 lb

## 2017-09-09 DIAGNOSIS — I10 Essential (primary) hypertension: Secondary | ICD-10-CM

## 2017-09-09 DIAGNOSIS — I4821 Permanent atrial fibrillation: Secondary | ICD-10-CM

## 2017-09-09 DIAGNOSIS — F0150 Vascular dementia without behavioral disturbance: Secondary | ICD-10-CM | POA: Diagnosis not present

## 2017-09-09 DIAGNOSIS — I2510 Atherosclerotic heart disease of native coronary artery without angina pectoris: Secondary | ICD-10-CM

## 2017-09-09 DIAGNOSIS — I5032 Chronic diastolic (congestive) heart failure: Secondary | ICD-10-CM | POA: Diagnosis not present

## 2017-09-09 DIAGNOSIS — Z23 Encounter for immunization: Secondary | ICD-10-CM

## 2017-09-09 DIAGNOSIS — G309 Alzheimer's disease, unspecified: Secondary | ICD-10-CM

## 2017-09-09 DIAGNOSIS — I482 Chronic atrial fibrillation: Secondary | ICD-10-CM

## 2017-09-09 DIAGNOSIS — R972 Elevated prostate specific antigen [PSA]: Secondary | ICD-10-CM

## 2017-09-09 DIAGNOSIS — F0390 Unspecified dementia without behavioral disturbance: Secondary | ICD-10-CM | POA: Insufficient documentation

## 2017-09-09 MED ORDER — DONEPEZIL HCL 5 MG PO TABS: 5.0000 mg | ORAL_TABLET | Freq: Every day | ORAL | 3 refills | Status: DC

## 2017-09-09 MED ORDER — BRIMONIDINE TARTRATE 0.2 % OP SOLN: 1.0000 [drp] | Freq: Three times a day (TID) | OPHTHALMIC | 12 refills | Status: DC

## 2017-09-09 NOTE — Patient Instructions (Addendum)
Limit your sodium (Salt) intake  Return in one month for follow-up I will use is more closed rash or

## 2017-09-09 NOTE — Progress Notes (Signed)
Subjective:    Patient ID: Grant Lawrence, male    DOB: 08-08-1931, 83 y.o.   MRN: [id number]  HPI  83 year old patient who has a complex cardiac history who is seen today accompanied by his daughter "to evaluate for assisted living". Presently, he has wife live alone but his daughter does live close by.  The daughter states that her mother has mild dementia and is on Aricept. He complains of pain in the left leg area.  Closer questioning, it appears that he is bothered by a rash in the groin region.  His daughter states that he changes  his clothes and bathes  very infrequently. His daughter states that he always has a tough time expressing himself and speaks very slowly.  Although her mother has some mild dementia, she has not appreciated. the degree of her father's cognitive impairment.  MMSE attempted.  He scored 0/10 involving the first 10 questions about orientation.  He was unable to name the year, the state.  He lives in or the city.  Past Medical History:  Diagnosis Date  . CHF (congestive heart failure) ([place])   . Coronary artery disease   . Hyperlipidemia   . Hypertension      Social History   Social History  . Marital status: Married    Spouse name: N/A  . Number of children: N/A  . Years of education: N/A   Occupational History  . Not on file.   Social History Main Topics  . Smoking status: Never Smoker  . Smokeless tobacco: Never Used  . Alcohol use No  . Drug use: No  . Sexual activity: Not on file   Other Topics Concern  . Not on file   Social History Narrative  . No narrative on file    Past Surgical History:  Procedure Laterality Date  . CARDIAC DEFIBRILLATOR PLACEMENT  09/26/2009  . CORONARY ARTERY BYPASS GRAFT  2005  . CORONARY ARTERY BYPASS GRAFT    . IMPLANTABLE CARDIOVERTER DEFIBRILLATOR (ICD) GENERATOR CHANGE N/A 08/23/2014   Procedure: ICD GENERATOR CHANGE;  Surgeon: Sanda Klein, MD;  Location: [facility];  Service: Cardiovascular;   Laterality: N/A;  . MITRAL VALVE REPAIR (MV)/CORONARY ARTERY BYPASS GRAFTING (CABG)    . MITRAL VALVULOPLASTY  2005    Family History  Problem Relation Age of Onset  . Cancer Father   . Diabetes Brother     Allergies  Allergen Reactions  . Rosuvastatin Calcium     Myalgia  . Statins     Myalgia    Current Outpatient Prescriptions on File Prior to Visit  Medication Sig Dispense Refill  . carvedilol (COREG) 3.125 MG tablet TAKE ONE TABLET BY MOUTH TWICE DAILY WITH MEALS 180 tablet 3  . ezetimibe (ZETIA) 10 MG tablet Take 1 tablet (10 mg total) by mouth daily. 90 tablet 3  . finasteride (PROSCAR) 5 MG tablet Take 5 mg by mouth daily.    Marland Kitchen HYDROcodone-acetaminophen (NORCO/VICODIN) 5-325 MG tablet Take 1 tablet by mouth every 6 (six) hours as needed for moderate pain. 30 tablet 0  . lisinopril (PRINIVIL,ZESTRIL) 5 MG tablet Take 1 tablet (5 mg total) by mouth 2 (two) times daily. 180 tablet 1  . Multiple Vitamins-Minerals (CENTRUM SILVER PO) Take 1 tablet by mouth daily.    . tamsulosin (FLOMAX) 0.4 MG CAPS capsule Take 1 capsule by mouth every evening.    . warfarin (COUMADIN) 5 MG tablet Take 1 to 1 and 1/2 tablets daily as directed by  coumadin clinic 100 tablet 1   No current facility-administered medications on file prior to visit.     BP (!) 148/70   Pulse 72   Ht 5\' 9"  (1.753 m)   Wt 166 lb 3.2 oz (75.4 kg)   SpO2 98%   BMI 24.54 kg/m   Wt Readings from Last 3 Encounters:  09/09/17 166 lb 3.2 oz (75.4 kg)  04/25/17 160 lb 9.6 oz (72.8 kg)  04/11/17 161 lb 3.2 oz (73.1 kg)    Review of Systems  Constitutional: Negative for appetite change, chills, fatigue and fever.  HENT: Negative for congestion, dental problem, ear pain, hearing loss, sore throat, tinnitus, trouble swallowing and voice change.   Eyes: Negative for pain, discharge and visual disturbance.  Respiratory: Negative for cough, chest tightness, wheezing and stridor.   Cardiovascular: Negative for chest  pain, palpitations and leg swelling.  Gastrointestinal: Negative for abdominal distention, abdominal pain, blood in stool, constipation, diarrhea, nausea and vomiting.  Genitourinary: Negative for difficulty urinating, discharge, flank pain, genital sores, hematuria and urgency.  Musculoskeletal: Positive for arthralgias. Negative for back pain, gait problem, joint swelling, myalgias and neck stiffness.       Bilateral knee pain  Skin: Positive for rash.  Neurological: Negative for dizziness, syncope, speech difficulty, weakness, numbness and headaches.  Hematological: Negative for adenopathy. Does not bruise/bleed easily.  Psychiatric/Behavioral: Positive for behavioral problems and confusion. Negative for dysphoric mood. The patient is not nervous/anxious.        Objective:   Physical Exam  Constitutional: He is oriented to person, place, and time. He appears well-developed and well-nourished. No distress.  Alert with significant cognitive impairment Hard  of hearing Afebrile Blood pressure stable  HENT:  Head: Normocephalic.  Right Ear: External ear normal.  Left Ear: External ear normal.  Eyes: Conjunctivae and EOM are normal.  Neck: Normal range of motion.  Cardiovascular: Normal rate.   Murmur heard. Grade 3/6 systolic murmur  Pulmonary/Chest: Breath sounds normal.  Abdominal: Bowel sounds are normal.  Genitourinary:  Genitourinary Comments: Left inguinal hernia and significant enlargement of both testicles consistent with large hydroceles  Musculoskeletal: Normal range of motion. He exhibits no edema or tenderness.  Neurological: He is alert and oriented to person, place, and time. No cranial nerve deficit.  Severe cognitive impairment  Skin:  Significant dermatitis in the left groin area  Psychiatric: He has a normal mood and affect. His behavior is normal.          Assessment & Plan:   Severe cognitive impairment.  We'll check a head CT as well as screening lab.   Start Aricept 5 mg.  Follow-up 4 weeks Fungal dermatitis.  Will treat with Lamisil cream twice daily Coronary artery disease History of elevated PSA.  Will repeat Status post a smoker Essential hypertension, stable  Results of exam.  Discussed with daughter.  She will have a family conference to consider placement  .[name]

## 2017-09-10 LAB — COMPREHENSIVE METABOLIC PANEL
ALBUMIN: 4.5 g/dL (ref 3.5–5.2)
ALT: 20 U/L (ref 0–53)
AST: 25 U/L (ref 0–37)
Alkaline Phosphatase: 90 U/L (ref 39–117)
BUN: 19 mg/dL (ref 6–23)
CHLORIDE: 107 meq/L (ref 96–112)
CO2: 29 meq/L (ref 19–32)
CREATININE: 0.75 mg/dL (ref 0.40–1.50)
Calcium: 10.1 mg/dL (ref 8.4–10.5)
GFR: 105.31 mL/min (ref >60.00)
GLUCOSE: 81 mg/dL (ref 70–99)
Potassium: 4 mEq/L (ref 3.5–5.1)
SODIUM: 143 meq/L (ref 135–145)
Total Bilirubin: 1 mg/dL (ref 0.2–1.2)
Total Protein: 6.9 g/dL (ref 6.0–8.3)

## 2017-09-10 LAB — RPR: RPR Ser Ql: NONREACTIVE

## 2017-09-10 LAB — CBC WITH DIFFERENTIAL/PLATELET
BASOS PCT: 1 % (ref 0.0–3.0)
Basophils Absolute: 0.1 10*3/uL (ref 0.0–0.1)
EOS ABS: 0.1 10*3/uL (ref 0.0–0.7)
Eosinophils Relative: 1.6 % (ref 0.0–5.0)
HCT: 41.2 % (ref 39.0–52.0)
Hemoglobin: 13.6 g/dL (ref 13.0–17.0)
LYMPHS ABS: 1.5 10*3/uL (ref 0.7–4.0)
Lymphocytes Relative: 27 % (ref 12.0–46.0)
MCHC: 33.1 g/dL (ref 30.0–36.0)
MCV: 100.4 fl — ABNORMAL HIGH (ref 78.0–100.0)
MONO ABS: 0.5 10*3/uL (ref 0.1–1.0)
Monocytes Relative: 8.6 % (ref 3.0–12.0)
NEUTROS ABS: 3.6 10*3/uL (ref 1.4–7.7)
NEUTROS PCT: 61.8 % (ref 43.0–77.0)
PLATELETS: 143 10*3/uL — AB (ref 150.0–400.0)
RBC: 4.11 Mil/uL — ABNORMAL LOW (ref 4.22–5.81)
RDW: 13.7 % (ref 11.5–15.5)
WBC: 5.7 10*3/uL (ref 4.0–10.5)

## 2017-09-10 LAB — VITAMIN B12: Vitamin B-12: 628 pg/mL (ref 211–911)

## 2017-09-10 LAB — PSA: PSA: 5.03 ng/mL — AB (ref 0.10–4.00)

## 2017-09-10 LAB — TSH: TSH: 2.42 u[IU]/mL (ref 0.35–4.50)

## 2017-09-12 ENCOUNTER — Ambulatory Visit (INDEPENDENT_AMBULATORY_CARE_PROVIDER_SITE_OTHER): Payer: Medicare Other | Admitting: Pharmacist

## 2017-09-12 DIAGNOSIS — Z08 Encounter for follow-up examination after completed treatment for malignant neoplasm: Secondary | ICD-10-CM | POA: Diagnosis not present

## 2017-09-12 DIAGNOSIS — D1801 Hemangioma of skin and subcutaneous tissue: Secondary | ICD-10-CM | POA: Diagnosis not present

## 2017-09-12 DIAGNOSIS — D171 Benign lipomatous neoplasm of skin and subcutaneous tissue of trunk: Secondary | ICD-10-CM | POA: Diagnosis not present

## 2017-09-12 DIAGNOSIS — I4891 Unspecified atrial fibrillation: Secondary | ICD-10-CM | POA: Diagnosis not present

## 2017-09-12 DIAGNOSIS — Z7901 Long term (current) use of anticoagulants: Secondary | ICD-10-CM | POA: Diagnosis not present

## 2017-09-12 DIAGNOSIS — L570 Actinic keratosis: Secondary | ICD-10-CM | POA: Diagnosis not present

## 2017-09-12 DIAGNOSIS — I4821 Permanent atrial fibrillation: Secondary | ICD-10-CM

## 2017-09-12 DIAGNOSIS — Z85828 Personal history of other malignant neoplasm of skin: Secondary | ICD-10-CM | POA: Diagnosis not present

## 2017-09-12 LAB — POCT INR: INR: 2

## 2017-09-16 ENCOUNTER — Inpatient Hospital Stay: Admission: RE | Admit: 2017-09-16 | Payer: Medicare Other | Source: Ambulatory Visit

## 2017-09-21 DIAGNOSIS — R350 Frequency of micturition: Secondary | ICD-10-CM | POA: Diagnosis not present

## 2017-09-21 DIAGNOSIS — N390 Urinary tract infection, site not specified: Secondary | ICD-10-CM | POA: Diagnosis not present

## 2017-09-21 DIAGNOSIS — Z5181 Encounter for therapeutic drug level monitoring: Secondary | ICD-10-CM | POA: Diagnosis not present

## 2017-09-21 DIAGNOSIS — A499 Bacterial infection, unspecified: Secondary | ICD-10-CM | POA: Diagnosis not present

## 2017-09-21 DIAGNOSIS — Z7689 Persons encountering health services in other specified circumstances: Secondary | ICD-10-CM | POA: Diagnosis not present

## 2017-09-22 ENCOUNTER — Telehealth: Payer: Self-pay | Admitting: Pharmacist

## 2017-09-22 NOTE — Telephone Encounter (Signed)
Patient started cephalexin 500mg  BID x 1 week for UTI. Most recent INR on 09/12/2017 was 2.0.    No additional intervention needed. Cointinue dose and f/u as previously instructed.

## 2017-09-25 ENCOUNTER — Ambulatory Visit (INDEPENDENT_AMBULATORY_CARE_PROVIDER_SITE_OTHER)
Admission: RE | Admit: 2017-09-25 | Discharge: 2017-09-25 | Disposition: A | Discharge status: Home / Self Care | Payer: Medicare Other | Source: Ambulatory Visit | Attending: Internal Medicine | Admitting: Internal Medicine

## 2017-09-25 DIAGNOSIS — G309 Alzheimer's disease, unspecified: Secondary | ICD-10-CM

## 2017-09-25 DIAGNOSIS — F0150 Vascular dementia without behavioral disturbance: Secondary | ICD-10-CM | POA: Diagnosis not present

## 2017-10-02 ENCOUNTER — Telehealth: Payer: Self-pay | Admitting: Internal Medicine

## 2017-10-02 NOTE — Telephone Encounter (Signed)
Daughter calling for results of CT scan done 10/18. She is on [facility]

## 2017-10-03 DIAGNOSIS — M25461 Effusion, right knee: Secondary | ICD-10-CM | POA: Diagnosis not present

## 2017-10-03 DIAGNOSIS — I10 Essential (primary) hypertension: Secondary | ICD-10-CM | POA: Diagnosis not present

## 2017-10-03 DIAGNOSIS — Z7901 Long term (current) use of anticoagulants: Secondary | ICD-10-CM | POA: Diagnosis not present

## 2017-10-03 DIAGNOSIS — M7989 Other specified soft tissue disorders: Secondary | ICD-10-CM | POA: Diagnosis not present

## 2017-10-03 DIAGNOSIS — Z8679 Personal history of other diseases of the circulatory system: Secondary | ICD-10-CM | POA: Diagnosis not present

## 2017-10-03 DIAGNOSIS — M25462 Effusion, left knee: Secondary | ICD-10-CM | POA: Diagnosis not present

## 2017-10-03 NOTE — Telephone Encounter (Signed)
Please advise 

## 2017-10-05 ENCOUNTER — Encounter: Payer: Self-pay | Admitting: Cardiovascular Disease

## 2017-10-05 ENCOUNTER — Encounter: Payer: Self-pay | Admitting: Internal Medicine

## 2017-10-06 NOTE — Telephone Encounter (Signed)
Called and discussed

## 2017-10-10 ENCOUNTER — Ambulatory Visit (INDEPENDENT_AMBULATORY_CARE_PROVIDER_SITE_OTHER): Payer: Medicare Other | Admitting: Pharmacist Clinician (PhC)/ Clinical Pharmacy Specialist

## 2017-10-10 DIAGNOSIS — I4891 Unspecified atrial fibrillation: Secondary | ICD-10-CM

## 2017-10-10 DIAGNOSIS — Z7901 Long term (current) use of anticoagulants: Secondary | ICD-10-CM

## 2017-10-10 DIAGNOSIS — I4821 Permanent atrial fibrillation: Secondary | ICD-10-CM

## 2017-10-10 DIAGNOSIS — I482 Chronic atrial fibrillation: Secondary | ICD-10-CM

## 2017-10-10 LAB — POCT INR: INR: 1.7

## 2017-10-22 DIAGNOSIS — N401 Enlarged prostate with lower urinary tract symptoms: Secondary | ICD-10-CM | POA: Diagnosis not present

## 2017-10-22 DIAGNOSIS — R972 Elevated prostate specific antigen [PSA]: Secondary | ICD-10-CM | POA: Diagnosis not present

## 2017-10-22 DIAGNOSIS — R3912 Poor urinary stream: Secondary | ICD-10-CM | POA: Diagnosis not present

## 2017-10-24 ENCOUNTER — Ambulatory Visit (INDEPENDENT_AMBULATORY_CARE_PROVIDER_SITE_OTHER): Payer: Medicare Other | Admitting: Pharmacist

## 2017-10-24 ENCOUNTER — Telehealth: Payer: Self-pay | Admitting: Internal Medicine

## 2017-10-24 ENCOUNTER — Other Ambulatory Visit: Payer: Self-pay | Admitting: Internal Medicine

## 2017-10-24 DIAGNOSIS — Z7901 Long term (current) use of anticoagulants: Secondary | ICD-10-CM | POA: Diagnosis not present

## 2017-10-24 DIAGNOSIS — G309 Alzheimer's disease, unspecified: Secondary | ICD-10-CM | POA: Diagnosis not present

## 2017-10-24 DIAGNOSIS — M542 Cervicalgia: Secondary | ICD-10-CM | POA: Diagnosis not present

## 2017-10-24 DIAGNOSIS — J0120 Acute ethmoidal sinusitis, unspecified: Secondary | ICD-10-CM | POA: Diagnosis not present

## 2017-10-24 DIAGNOSIS — Z952 Presence of prosthetic heart valve: Secondary | ICD-10-CM | POA: Diagnosis not present

## 2017-10-24 DIAGNOSIS — R251 Tremor, unspecified: Secondary | ICD-10-CM | POA: Diagnosis not present

## 2017-10-24 DIAGNOSIS — E785 Hyperlipidemia, unspecified: Secondary | ICD-10-CM | POA: Diagnosis not present

## 2017-10-24 DIAGNOSIS — M6281 Muscle weakness (generalized): Secondary | ICD-10-CM | POA: Diagnosis not present

## 2017-10-24 DIAGNOSIS — N400 Enlarged prostate without lower urinary tract symptoms: Secondary | ICD-10-CM | POA: Diagnosis not present

## 2017-10-24 DIAGNOSIS — R1900 Intra-abdominal and pelvic swelling, mass and lump, unspecified site: Secondary | ICD-10-CM | POA: Diagnosis not present

## 2017-10-24 DIAGNOSIS — R9401 Abnormal electroencephalogram [EEG]: Secondary | ICD-10-CM | POA: Diagnosis not present

## 2017-10-24 DIAGNOSIS — N401 Enlarged prostate with lower urinary tract symptoms: Secondary | ICD-10-CM | POA: Diagnosis not present

## 2017-10-24 DIAGNOSIS — R51 Headache: Secondary | ICD-10-CM | POA: Diagnosis not present

## 2017-10-24 DIAGNOSIS — R2689 Other abnormalities of gait and mobility: Secondary | ICD-10-CM | POA: Diagnosis not present

## 2017-10-24 DIAGNOSIS — I340 Nonrheumatic mitral (valve) insufficiency: Secondary | ICD-10-CM | POA: Diagnosis not present

## 2017-10-24 DIAGNOSIS — S0990XA Unspecified injury of head, initial encounter: Secondary | ICD-10-CM | POA: Diagnosis not present

## 2017-10-24 DIAGNOSIS — S199XXA Unspecified injury of neck, initial encounter: Secondary | ICD-10-CM | POA: Diagnosis not present

## 2017-10-24 DIAGNOSIS — I509 Heart failure, unspecified: Secondary | ICD-10-CM | POA: Diagnosis not present

## 2017-10-24 DIAGNOSIS — M48061 Spinal stenosis, lumbar region without neurogenic claudication: Secondary | ICD-10-CM | POA: Diagnosis not present

## 2017-10-24 DIAGNOSIS — R338 Other retention of urine: Secondary | ICD-10-CM | POA: Diagnosis not present

## 2017-10-24 DIAGNOSIS — R262 Difficulty in walking, not elsewhere classified: Secondary | ICD-10-CM | POA: Diagnosis not present

## 2017-10-24 DIAGNOSIS — M6250 Muscle wasting and atrophy, not elsewhere classified, unspecified site: Secondary | ICD-10-CM | POA: Diagnosis not present

## 2017-10-24 DIAGNOSIS — I69398 Other sequelae of cerebral infarction: Secondary | ICD-10-CM | POA: Diagnosis not present

## 2017-10-24 DIAGNOSIS — I4891 Unspecified atrial fibrillation: Secondary | ICD-10-CM | POA: Diagnosis not present

## 2017-10-24 DIAGNOSIS — I351 Nonrheumatic aortic (valve) insufficiency: Secondary | ICD-10-CM | POA: Diagnosis not present

## 2017-10-24 DIAGNOSIS — R278 Other lack of coordination: Secondary | ICD-10-CM | POA: Diagnosis not present

## 2017-10-24 DIAGNOSIS — Z8673 Personal history of transient ischemic attack (TIA), and cerebral infarction without residual deficits: Secondary | ICD-10-CM | POA: Diagnosis not present

## 2017-10-24 DIAGNOSIS — I482 Chronic atrial fibrillation: Secondary | ICD-10-CM

## 2017-10-24 DIAGNOSIS — Z95810 Presence of automatic (implantable) cardiac defibrillator: Secondary | ICD-10-CM | POA: Diagnosis not present

## 2017-10-24 DIAGNOSIS — I480 Paroxysmal atrial fibrillation: Secondary | ICD-10-CM | POA: Diagnosis not present

## 2017-10-24 DIAGNOSIS — N433 Hydrocele, unspecified: Secondary | ICD-10-CM | POA: Diagnosis not present

## 2017-10-24 DIAGNOSIS — Z9181 History of falling: Secondary | ICD-10-CM | POA: Diagnosis not present

## 2017-10-24 DIAGNOSIS — I2510 Atherosclerotic heart disease of native coronary artery without angina pectoris: Secondary | ICD-10-CM | POA: Diagnosis not present

## 2017-10-24 DIAGNOSIS — R05 Cough: Secondary | ICD-10-CM | POA: Diagnosis not present

## 2017-10-24 DIAGNOSIS — I639 Cerebral infarction, unspecified: Secondary | ICD-10-CM | POA: Diagnosis not present

## 2017-10-24 DIAGNOSIS — I4821 Permanent atrial fibrillation: Secondary | ICD-10-CM

## 2017-10-24 DIAGNOSIS — Z951 Presence of aortocoronary bypass graft: Secondary | ICD-10-CM | POA: Diagnosis not present

## 2017-10-24 DIAGNOSIS — I110 Hypertensive heart disease with heart failure: Secondary | ICD-10-CM | POA: Diagnosis not present

## 2017-10-24 DIAGNOSIS — R5381 Other malaise: Secondary | ICD-10-CM | POA: Diagnosis not present

## 2017-10-24 DIAGNOSIS — R2990 Unspecified symptoms and signs involving the nervous system: Secondary | ICD-10-CM | POA: Diagnosis not present

## 2017-10-24 DIAGNOSIS — W1839XA Other fall on same level, initial encounter: Secondary | ICD-10-CM | POA: Diagnosis not present

## 2017-10-24 DIAGNOSIS — I252 Old myocardial infarction: Secondary | ICD-10-CM | POA: Diagnosis not present

## 2017-10-24 DIAGNOSIS — G9341 Metabolic encephalopathy: Secondary | ICD-10-CM | POA: Diagnosis not present

## 2017-10-24 DIAGNOSIS — I714 Abdominal aortic aneurysm, without rupture: Secondary | ICD-10-CM | POA: Diagnosis not present

## 2017-10-24 DIAGNOSIS — R3121 Asymptomatic microscopic hematuria: Secondary | ICD-10-CM | POA: Diagnosis not present

## 2017-10-24 DIAGNOSIS — I25810 Atherosclerosis of coronary artery bypass graft(s) without angina pectoris: Secondary | ICD-10-CM | POA: Diagnosis not present

## 2017-10-24 DIAGNOSIS — Z79899 Other long term (current) drug therapy: Secondary | ICD-10-CM | POA: Diagnosis not present

## 2017-10-24 DIAGNOSIS — N503 Cyst of epididymis: Secondary | ICD-10-CM | POA: Diagnosis not present

## 2017-10-24 DIAGNOSIS — R269 Unspecified abnormalities of gait and mobility: Secondary | ICD-10-CM | POA: Diagnosis not present

## 2017-10-24 LAB — POCT INR: INR: 2.4

## 2017-10-24 MED ORDER — EZETIMIBE 10 MG PO TABS: 10.0000 mg | ORAL_TABLET | Freq: Every day | ORAL | 0 refills | Status: DC

## 2017-10-24 MED ORDER — EZETIMIBE 10 MG PO TABS: 10.0000 mg | ORAL_TABLET | Freq: Every day | ORAL | 0 refills | Status: AC

## 2017-10-24 NOTE — Telephone Encounter (Signed)
Patient uses pharmacy in [place] (for Zetia)- patient's daughter is calling- she used to get the hard copy and send it in. She has had the pharmacy fax  the request- and they have not received a response regarding the medication. Explained that if the office was not aware to expect this- they probably did not know what to do. I told the patient if they still had the paper work they could see if they could send the Rx out- if not- they could mail a hard copy Rx to Grant Lawrence so she could order the medication for him. Please review and Rx for patient.

## 2017-10-24 NOTE — Telephone Encounter (Signed)
Copied from [place] [id number]. Topic: Quick Communication - See Telephone Encounter >> Oct 24, 2017 11:30 AM Robina Ade, Helene Kelp D wrote: CRM for notification. See Telephone encounter for: 10/24/17.  Patient needs refill on his ezetimibe (ZETIA) 10 MG tablet for a 90 day supply from [facility] in [place]. Patient daughter called and said he has always got this medication from them. If any question please call patient back, thanks.

## 2017-10-24 NOTE — Telephone Encounter (Addendum)
Called patient's daughter, [name], and left message to return call to clarify if she wants to pick up hard copy rx or have it faxed.  Rx printed and forwarded to PCP for signature on Monday.

## 2017-10-24 NOTE — Telephone Encounter (Signed)
Copied from [place] [id number]. Topic: General - Other >> Oct 24, 2017  5:14 PM Cecelia Byars, NT wrote: Reason for CRM: Patient has fax number to [facility] in [place]  [phone] call back  [phone] and the daughter number is [phone]

## 2017-10-27 NOTE — Telephone Encounter (Signed)
Called patient's daughter, [name], and left message to inform her that Rx was signed by MD and was faxed to [phone].

## 2017-10-27 NOTE — Telephone Encounter (Signed)
Called patient's daughter and left message to inform her that Rx was signed by MD and was faxed to [phone].

## 2017-10-28 ENCOUNTER — Telehealth: Payer: Self-pay | Admitting: Cardiovascular Disease

## 2017-10-28 MED ORDER — ZOLPIDEM TARTRATE 5 MG PO TABS
5.00 mg | ORAL_TABLET | ORAL | Status: DC
Start: ? — End: 2017-10-28

## 2017-10-28 MED ORDER — DONEPEZIL HCL 5 MG PO TABS
5.00 mg | ORAL_TABLET | ORAL | Status: DC
Start: 2017-10-31 — End: 2017-10-28

## 2017-10-28 MED ORDER — HYDRALAZINE HCL 20 MG/ML IJ SOLN
10.00 mg | INTRAMUSCULAR | Status: DC
Start: ? — End: 2017-10-28

## 2017-10-28 MED ORDER — HYDROCODONE-ACETAMINOPHEN 5-325 MG PO TABS
1.00 | ORAL_TABLET | ORAL | Status: DC
Start: ? — End: 2017-10-28

## 2017-10-28 MED ORDER — EZETIMIBE 10 MG PO TABS
10.00 mg | ORAL_TABLET | ORAL | Status: DC
Start: 2017-10-31 — End: 2017-10-28

## 2017-10-28 MED ORDER — ACETAMINOPHEN 325 MG PO TABS
650.00 mg | ORAL_TABLET | ORAL | Status: DC
Start: ? — End: 2017-10-28

## 2017-10-28 MED ORDER — CLONIDINE HCL 0.1 MG PO TABS
.10 mg | ORAL_TABLET | ORAL | Status: DC
Start: ? — End: 2017-10-28

## 2017-10-28 MED ORDER — TAMSULOSIN HCL 0.4 MG PO CAPS
.40 mg | ORAL_CAPSULE | ORAL | Status: DC
Start: 2017-11-01 — End: 2017-10-28

## 2017-10-28 MED ORDER — ONDANSETRON HCL 4 MG/2ML IJ SOLN
4.00 mg | INTRAMUSCULAR | Status: DC
Start: ? — End: 2017-10-28

## 2017-10-28 MED ORDER — WARFARIN SODIUM 1 MG PO TABS
1.00 mg | ORAL_TABLET | ORAL | Status: DC
Start: 2017-10-28 — End: 2017-10-28

## 2017-10-28 MED ORDER — CARVEDILOL 3.125 MG PO TABS
3.13 mg | ORAL_TABLET | ORAL | Status: DC
Start: 2017-10-31 — End: 2017-10-28

## 2017-10-28 MED ORDER — SORBITOL 70 % RE SOLN
30.00 mL | RECTAL | Status: DC
Start: ? — End: 2017-10-28

## 2017-10-28 MED ORDER — LISINOPRIL 5 MG PO TABS
10.00 mg | ORAL_TABLET | ORAL | Status: DC
Start: 2017-10-28 — End: 2017-10-28

## 2017-10-28 MED ORDER — FINASTERIDE 5 MG PO TABS
5.00 mg | ORAL_TABLET | ORAL | Status: DC
Start: 2017-11-01 — End: 2017-10-28

## 2017-10-28 MED ORDER — BRIMONIDINE TARTRATE 0.2 % OP SOLN
1.00 | OPHTHALMIC | Status: DC
Start: 2017-10-31 — End: 2017-10-28

## 2017-10-28 MED ORDER — DIPHENHYDRAMINE HCL 25 MG PO CAPS
25.00 mg | ORAL_CAPSULE | ORAL | Status: DC
Start: ? — End: 2017-10-28

## 2017-10-28 MED ORDER — BISACODYL 5 MG PO TBEC
10.00 mg | DELAYED_RELEASE_TABLET | ORAL | Status: DC
Start: ? — End: 2017-10-28

## 2017-10-28 MED ORDER — ALUMINUM-MAGNESIUM-SIMETHICONE 200-200-20 MG/5ML PO SUSP
30.00 mL | ORAL | Status: DC
Start: ? — End: 2017-10-28

## 2017-10-28 MED ORDER — ONDANSETRON 4 MG PO TBDP
4.00 mg | ORAL_TABLET | ORAL | Status: DC
Start: ? — End: 2017-10-28

## 2017-10-28 NOTE — Telephone Encounter (Signed)
New message    Patient daughter [name] calling, requesting a call from Dr C. States her dad is in the hospital at [facility]. Concerned about care. Has questions about "beating in stomach". Please call.

## 2017-10-28 NOTE — Telephone Encounter (Signed)
Advised patient

## 2017-10-28 NOTE — Telephone Encounter (Signed)
Patient went to hospital Friday afternoon around 4:30 pm secondary to a fall and not feeling well. A MRI of head was ordered that show old strokes but nothing new. Patient has a pulsating in abdomen that can be visually seen on exam. Daughter stated ultra sound done and she assumes on abdomen but unsure. MD now ordering a CT scan to evaluate further. Daughter just concerned and would like to speak with Dr Vivia Ewing.

## 2017-10-28 NOTE — Telephone Encounter (Signed)
I was able to review his abdominal US and head MRI through [facility] and there are no new serious problems on either one. I cannot review the clinical notes so I am not sure what they are looking for on (abdominal?) CT. I assume that they interrogated his BiV ICD (since they did an MRI) and it is functioning normally. Please keep [place] posted. MCr

## 2017-10-28 NOTE — Telephone Encounter (Signed)
Advised daughter not patient

## 2017-10-31 DIAGNOSIS — R2689 Other abnormalities of gait and mobility: Secondary | ICD-10-CM | POA: Diagnosis not present

## 2017-10-31 DIAGNOSIS — G9341 Metabolic encephalopathy: Secondary | ICD-10-CM | POA: Diagnosis not present

## 2017-10-31 DIAGNOSIS — N400 Enlarged prostate without lower urinary tract symptoms: Secondary | ICD-10-CM | POA: Diagnosis not present

## 2017-10-31 DIAGNOSIS — M6281 Muscle weakness (generalized): Secondary | ICD-10-CM | POA: Diagnosis not present

## 2017-10-31 DIAGNOSIS — Z8673 Personal history of transient ischemic attack (TIA), and cerebral infarction without residual deficits: Secondary | ICD-10-CM | POA: Diagnosis not present

## 2017-10-31 DIAGNOSIS — Z7901 Long term (current) use of anticoagulants: Secondary | ICD-10-CM | POA: Diagnosis not present

## 2017-10-31 DIAGNOSIS — R251 Tremor, unspecified: Secondary | ICD-10-CM | POA: Diagnosis not present

## 2017-10-31 DIAGNOSIS — R3121 Asymptomatic microscopic hematuria: Secondary | ICD-10-CM | POA: Diagnosis not present

## 2017-10-31 DIAGNOSIS — N453 Epididymo-orchitis: Secondary | ICD-10-CM | POA: Diagnosis not present

## 2017-10-31 DIAGNOSIS — I25810 Atherosclerosis of coronary artery bypass graft(s) without angina pectoris: Secondary | ICD-10-CM | POA: Diagnosis not present

## 2017-10-31 DIAGNOSIS — M6250 Muscle wasting and atrophy, not elsewhere classified, unspecified site: Secondary | ICD-10-CM | POA: Diagnosis not present

## 2017-10-31 DIAGNOSIS — E785 Hyperlipidemia, unspecified: Secondary | ICD-10-CM | POA: Diagnosis not present

## 2017-10-31 DIAGNOSIS — R278 Other lack of coordination: Secondary | ICD-10-CM | POA: Diagnosis not present

## 2017-10-31 DIAGNOSIS — I110 Hypertensive heart disease with heart failure: Secondary | ICD-10-CM | POA: Diagnosis not present

## 2017-10-31 DIAGNOSIS — M48061 Spinal stenosis, lumbar region without neurogenic claudication: Secondary | ICD-10-CM | POA: Diagnosis not present

## 2017-10-31 DIAGNOSIS — G309 Alzheimer's disease, unspecified: Secondary | ICD-10-CM | POA: Diagnosis not present

## 2017-10-31 DIAGNOSIS — N433 Hydrocele, unspecified: Secondary | ICD-10-CM | POA: Diagnosis not present

## 2017-10-31 DIAGNOSIS — N503 Cyst of epididymis: Secondary | ICD-10-CM | POA: Diagnosis not present

## 2017-10-31 DIAGNOSIS — I4891 Unspecified atrial fibrillation: Secondary | ICD-10-CM | POA: Diagnosis not present

## 2017-10-31 DIAGNOSIS — R2990 Unspecified symptoms and signs involving the nervous system: Secondary | ICD-10-CM | POA: Diagnosis not present

## 2017-10-31 DIAGNOSIS — I482 Chronic atrial fibrillation: Secondary | ICD-10-CM | POA: Diagnosis not present

## 2017-10-31 DIAGNOSIS — Z95810 Presence of automatic (implantable) cardiac defibrillator: Secondary | ICD-10-CM | POA: Diagnosis not present

## 2017-10-31 MED ORDER — WARFARIN SODIUM 7.5 MG PO TABS
7.50 mg | ORAL_TABLET | ORAL | Status: DC
Start: ? — End: 2017-10-31

## 2017-11-04 DIAGNOSIS — G309 Alzheimer's disease, unspecified: Secondary | ICD-10-CM | POA: Diagnosis not present

## 2017-11-04 DIAGNOSIS — I110 Hypertensive heart disease with heart failure: Secondary | ICD-10-CM | POA: Diagnosis not present

## 2017-11-05 DIAGNOSIS — I110 Hypertensive heart disease with heart failure: Secondary | ICD-10-CM | POA: Diagnosis not present

## 2017-11-05 DIAGNOSIS — G309 Alzheimer's disease, unspecified: Secondary | ICD-10-CM | POA: Diagnosis not present

## 2017-11-06 DIAGNOSIS — N433 Hydrocele, unspecified: Secondary | ICD-10-CM | POA: Diagnosis not present

## 2017-11-06 DIAGNOSIS — N503 Cyst of epididymis: Secondary | ICD-10-CM | POA: Diagnosis not present

## 2017-11-10 DIAGNOSIS — N453 Epididymo-orchitis: Secondary | ICD-10-CM | POA: Diagnosis not present

## 2017-11-10 DIAGNOSIS — N433 Hydrocele, unspecified: Secondary | ICD-10-CM | POA: Diagnosis not present

## 2017-11-25 ENCOUNTER — Telehealth: Payer: Self-pay | Admitting: Internal Medicine

## 2017-11-25 DIAGNOSIS — I110 Hypertensive heart disease with heart failure: Secondary | ICD-10-CM | POA: Diagnosis not present

## 2017-11-25 NOTE — Telephone Encounter (Signed)
Copied from [place]. Topic: Quick Communication - See Telephone Encounter >> Nov 25, 2017  3:06 PM Percell Belt A wrote: CRM for notification. See Telephone encounter for: Daughter called in and said that pt has been in a rehab for 3 weeks because he was not able to walk.  Daughter would like to know if pt would be ok to take melatonin.  Pt is way more confused as the sun goes down.  Any suggestion?    Best number [phone]    11/25/17.

## 2017-11-27 DIAGNOSIS — N503 Cyst of epididymis: Secondary | ICD-10-CM | POA: Diagnosis not present

## 2017-11-27 DIAGNOSIS — G309 Alzheimer's disease, unspecified: Secondary | ICD-10-CM | POA: Diagnosis not present

## 2017-11-27 DIAGNOSIS — N433 Hydrocele, unspecified: Secondary | ICD-10-CM | POA: Diagnosis not present

## 2017-11-28 DIAGNOSIS — Z95810 Presence of automatic (implantable) cardiac defibrillator: Secondary | ICD-10-CM | POA: Diagnosis not present

## 2017-11-28 DIAGNOSIS — I482 Chronic atrial fibrillation: Secondary | ICD-10-CM | POA: Diagnosis not present

## 2017-11-28 DIAGNOSIS — G309 Alzheimer's disease, unspecified: Secondary | ICD-10-CM | POA: Diagnosis not present

## 2017-11-28 DIAGNOSIS — I25810 Atherosclerosis of coronary artery bypass graft(s) without angina pectoris: Secondary | ICD-10-CM | POA: Diagnosis not present

## 2017-12-05 ENCOUNTER — Inpatient Hospital Stay: Payer: Medicare Other | Admitting: Adult Health

## 2017-12-15 DIAGNOSIS — N400 Enlarged prostate without lower urinary tract symptoms: Secondary | ICD-10-CM | POA: Diagnosis not present

## 2017-12-15 DIAGNOSIS — I69898 Other sequelae of other cerebrovascular disease: Secondary | ICD-10-CM | POA: Diagnosis not present

## 2017-12-15 DIAGNOSIS — M6281 Muscle weakness (generalized): Secondary | ICD-10-CM | POA: Diagnosis not present

## 2017-12-15 DIAGNOSIS — I482 Chronic atrial fibrillation: Secondary | ICD-10-CM | POA: Diagnosis not present

## 2017-12-15 DIAGNOSIS — N433 Hydrocele, unspecified: Secondary | ICD-10-CM | POA: Diagnosis not present

## 2017-12-15 DIAGNOSIS — N503 Cyst of epididymis: Secondary | ICD-10-CM | POA: Diagnosis not present

## 2017-12-15 DIAGNOSIS — G309 Alzheimer's disease, unspecified: Secondary | ICD-10-CM | POA: Diagnosis not present

## 2017-12-15 DIAGNOSIS — R278 Other lack of coordination: Secondary | ICD-10-CM | POA: Diagnosis not present

## 2017-12-15 DIAGNOSIS — Z95810 Presence of automatic (implantable) cardiac defibrillator: Secondary | ICD-10-CM | POA: Diagnosis not present

## 2017-12-15 DIAGNOSIS — I25709 Atherosclerosis of coronary artery bypass graft(s), unspecified, with unspecified angina pectoris: Secondary | ICD-10-CM | POA: Diagnosis not present

## 2017-12-15 DIAGNOSIS — R2689 Other abnormalities of gait and mobility: Secondary | ICD-10-CM | POA: Diagnosis not present

## 2017-12-17 ENCOUNTER — Telehealth: Payer: Self-pay | Admitting: Cardiovascular Disease

## 2017-12-17 DIAGNOSIS — M6281 Muscle weakness (generalized): Secondary | ICD-10-CM | POA: Diagnosis not present

## 2017-12-17 DIAGNOSIS — N503 Cyst of epididymis: Secondary | ICD-10-CM | POA: Diagnosis not present

## 2017-12-17 DIAGNOSIS — I69898 Other sequelae of other cerebrovascular disease: Secondary | ICD-10-CM | POA: Diagnosis not present

## 2017-12-17 DIAGNOSIS — N433 Hydrocele, unspecified: Secondary | ICD-10-CM | POA: Diagnosis not present

## 2017-12-17 DIAGNOSIS — I25709 Atherosclerosis of coronary artery bypass graft(s), unspecified, with unspecified angina pectoris: Secondary | ICD-10-CM | POA: Diagnosis not present

## 2017-12-17 DIAGNOSIS — G309 Alzheimer's disease, unspecified: Secondary | ICD-10-CM | POA: Diagnosis not present

## 2017-12-17 DIAGNOSIS — N400 Enlarged prostate without lower urinary tract symptoms: Secondary | ICD-10-CM | POA: Diagnosis not present

## 2017-12-17 DIAGNOSIS — I482 Chronic atrial fibrillation: Secondary | ICD-10-CM | POA: Diagnosis not present

## 2017-12-17 DIAGNOSIS — R2689 Other abnormalities of gait and mobility: Secondary | ICD-10-CM | POA: Diagnosis not present

## 2017-12-17 DIAGNOSIS — R278 Other lack of coordination: Secondary | ICD-10-CM | POA: Diagnosis not present

## 2017-12-17 DIAGNOSIS — Z95810 Presence of automatic (implantable) cardiac defibrillator: Secondary | ICD-10-CM | POA: Diagnosis not present

## 2017-12-17 NOTE — Telephone Encounter (Signed)
Received a call from patient's daughter [name] Dr.Croitoru's recommendations given.Scheduler will call back to schedule appointment with Dr.Croitoru for a device check.

## 2017-12-17 NOTE — Telephone Encounter (Signed)
Please call,Pt have always been on Coumadin.He went to a [facility] and they switched him to Eliquis.Did they call here and get permission to do this?s this alright for him to change to Eliquis after all these years? I

## 2017-12-17 NOTE — Telephone Encounter (Signed)
Returned call to patient's daughter Grant Lawrence no answer.[place].

## 2017-12-17 NOTE — Telephone Encounter (Signed)
Follow up     Daughter called back , when is her father due for new battery in device?

## 2017-12-17 NOTE — Telephone Encounter (Signed)
Returned call to patient's daughter [name].She stated father was recently admitted to [facility] with confusion.Stated he is weak and is living at [facility] assisted living.Stated he has been changed to Eliquis.She stated she thought Dr.Croitoru wanted him to stay on Coumadin.Message sent to Dr.Croitoru for advice.

## 2017-12-17 NOTE — Telephone Encounter (Signed)
His device has not been checked since remote download April 2018 and is overdue for a check (unless it was manually checked during his hospitalization in [place]). As of last April, estimated battery longevity was 20 months So through end of 2019). Eliquis is at least as good as warfarin. I see no problem with the change. I believe he was on warfarin due to cost issues. MCr

## 2017-12-18 DIAGNOSIS — I482 Chronic atrial fibrillation: Secondary | ICD-10-CM | POA: Diagnosis not present

## 2017-12-18 DIAGNOSIS — R278 Other lack of coordination: Secondary | ICD-10-CM | POA: Diagnosis not present

## 2017-12-18 DIAGNOSIS — G309 Alzheimer's disease, unspecified: Secondary | ICD-10-CM | POA: Diagnosis not present

## 2017-12-18 DIAGNOSIS — I69898 Other sequelae of other cerebrovascular disease: Secondary | ICD-10-CM | POA: Diagnosis not present

## 2017-12-18 DIAGNOSIS — N400 Enlarged prostate without lower urinary tract symptoms: Secondary | ICD-10-CM | POA: Diagnosis not present

## 2017-12-18 DIAGNOSIS — Z95810 Presence of automatic (implantable) cardiac defibrillator: Secondary | ICD-10-CM | POA: Diagnosis not present

## 2017-12-18 DIAGNOSIS — R2689 Other abnormalities of gait and mobility: Secondary | ICD-10-CM | POA: Diagnosis not present

## 2017-12-18 DIAGNOSIS — I25709 Atherosclerosis of coronary artery bypass graft(s), unspecified, with unspecified angina pectoris: Secondary | ICD-10-CM | POA: Diagnosis not present

## 2017-12-18 DIAGNOSIS — I10 Essential (primary) hypertension: Secondary | ICD-10-CM | POA: Diagnosis not present

## 2017-12-18 DIAGNOSIS — Z950 Presence of cardiac pacemaker: Secondary | ICD-10-CM | POA: Diagnosis not present

## 2017-12-18 DIAGNOSIS — N433 Hydrocele, unspecified: Secondary | ICD-10-CM | POA: Diagnosis not present

## 2017-12-18 DIAGNOSIS — M6281 Muscle weakness (generalized): Secondary | ICD-10-CM | POA: Diagnosis not present

## 2017-12-18 DIAGNOSIS — N503 Cyst of epididymis: Secondary | ICD-10-CM | POA: Diagnosis not present

## 2017-12-22 DIAGNOSIS — I25810 Atherosclerosis of coronary artery bypass graft(s) without angina pectoris: Secondary | ICD-10-CM | POA: Diagnosis not present

## 2017-12-22 DIAGNOSIS — I509 Heart failure, unspecified: Secondary | ICD-10-CM | POA: Diagnosis not present

## 2017-12-22 DIAGNOSIS — R2681 Unsteadiness on feet: Secondary | ICD-10-CM | POA: Diagnosis not present

## 2017-12-22 DIAGNOSIS — I69398 Other sequelae of cerebral infarction: Secondary | ICD-10-CM | POA: Diagnosis not present

## 2017-12-22 DIAGNOSIS — Z7901 Long term (current) use of anticoagulants: Secondary | ICD-10-CM | POA: Diagnosis not present

## 2017-12-22 DIAGNOSIS — I110 Hypertensive heart disease with heart failure: Secondary | ICD-10-CM | POA: Diagnosis not present

## 2017-12-22 DIAGNOSIS — Z95810 Presence of automatic (implantable) cardiac defibrillator: Secondary | ICD-10-CM | POA: Diagnosis not present

## 2017-12-22 DIAGNOSIS — N400 Enlarged prostate without lower urinary tract symptoms: Secondary | ICD-10-CM | POA: Diagnosis not present

## 2017-12-22 DIAGNOSIS — G309 Alzheimer's disease, unspecified: Secondary | ICD-10-CM | POA: Diagnosis not present

## 2017-12-22 DIAGNOSIS — I482 Chronic atrial fibrillation: Secondary | ICD-10-CM | POA: Diagnosis not present

## 2017-12-22 DIAGNOSIS — Z951 Presence of aortocoronary bypass graft: Secondary | ICD-10-CM | POA: Diagnosis not present

## 2017-12-23 DIAGNOSIS — G309 Alzheimer's disease, unspecified: Secondary | ICD-10-CM | POA: Diagnosis not present

## 2017-12-23 DIAGNOSIS — Z7901 Long term (current) use of anticoagulants: Secondary | ICD-10-CM | POA: Diagnosis not present

## 2017-12-23 DIAGNOSIS — N400 Enlarged prostate without lower urinary tract symptoms: Secondary | ICD-10-CM | POA: Diagnosis not present

## 2017-12-23 DIAGNOSIS — I509 Heart failure, unspecified: Secondary | ICD-10-CM | POA: Diagnosis not present

## 2017-12-23 DIAGNOSIS — Z951 Presence of aortocoronary bypass graft: Secondary | ICD-10-CM | POA: Diagnosis not present

## 2017-12-23 DIAGNOSIS — Z95810 Presence of automatic (implantable) cardiac defibrillator: Secondary | ICD-10-CM | POA: Diagnosis not present

## 2017-12-23 DIAGNOSIS — I482 Chronic atrial fibrillation: Secondary | ICD-10-CM | POA: Diagnosis not present

## 2017-12-23 DIAGNOSIS — I69398 Other sequelae of cerebral infarction: Secondary | ICD-10-CM | POA: Diagnosis not present

## 2017-12-23 DIAGNOSIS — I25810 Atherosclerosis of coronary artery bypass graft(s) without angina pectoris: Secondary | ICD-10-CM | POA: Diagnosis not present

## 2017-12-23 DIAGNOSIS — R2681 Unsteadiness on feet: Secondary | ICD-10-CM | POA: Diagnosis not present

## 2017-12-23 DIAGNOSIS — I110 Hypertensive heart disease with heart failure: Secondary | ICD-10-CM | POA: Diagnosis not present

## 2017-12-24 DIAGNOSIS — I25810 Atherosclerosis of coronary artery bypass graft(s) without angina pectoris: Secondary | ICD-10-CM | POA: Diagnosis not present

## 2017-12-24 DIAGNOSIS — Z951 Presence of aortocoronary bypass graft: Secondary | ICD-10-CM | POA: Diagnosis not present

## 2017-12-24 DIAGNOSIS — I482 Chronic atrial fibrillation: Secondary | ICD-10-CM | POA: Diagnosis not present

## 2017-12-24 DIAGNOSIS — Z7901 Long term (current) use of anticoagulants: Secondary | ICD-10-CM | POA: Diagnosis not present

## 2017-12-24 DIAGNOSIS — G309 Alzheimer's disease, unspecified: Secondary | ICD-10-CM | POA: Diagnosis not present

## 2017-12-24 DIAGNOSIS — N400 Enlarged prostate without lower urinary tract symptoms: Secondary | ICD-10-CM | POA: Diagnosis not present

## 2017-12-24 DIAGNOSIS — I110 Hypertensive heart disease with heart failure: Secondary | ICD-10-CM | POA: Diagnosis not present

## 2017-12-24 DIAGNOSIS — I509 Heart failure, unspecified: Secondary | ICD-10-CM | POA: Diagnosis not present

## 2017-12-24 DIAGNOSIS — I69398 Other sequelae of cerebral infarction: Secondary | ICD-10-CM | POA: Diagnosis not present

## 2017-12-24 DIAGNOSIS — R2681 Unsteadiness on feet: Secondary | ICD-10-CM | POA: Diagnosis not present

## 2017-12-24 DIAGNOSIS — Z95810 Presence of automatic (implantable) cardiac defibrillator: Secondary | ICD-10-CM | POA: Diagnosis not present

## 2017-12-25 DIAGNOSIS — R2681 Unsteadiness on feet: Secondary | ICD-10-CM | POA: Diagnosis not present

## 2017-12-25 DIAGNOSIS — I69398 Other sequelae of cerebral infarction: Secondary | ICD-10-CM | POA: Diagnosis not present

## 2017-12-25 DIAGNOSIS — I482 Chronic atrial fibrillation: Secondary | ICD-10-CM | POA: Diagnosis not present

## 2017-12-25 DIAGNOSIS — G309 Alzheimer's disease, unspecified: Secondary | ICD-10-CM | POA: Diagnosis not present

## 2017-12-25 DIAGNOSIS — Z951 Presence of aortocoronary bypass graft: Secondary | ICD-10-CM | POA: Diagnosis not present

## 2017-12-25 DIAGNOSIS — I110 Hypertensive heart disease with heart failure: Secondary | ICD-10-CM | POA: Diagnosis not present

## 2017-12-25 DIAGNOSIS — I25810 Atherosclerosis of coronary artery bypass graft(s) without angina pectoris: Secondary | ICD-10-CM | POA: Diagnosis not present

## 2017-12-25 DIAGNOSIS — N400 Enlarged prostate without lower urinary tract symptoms: Secondary | ICD-10-CM | POA: Diagnosis not present

## 2017-12-25 DIAGNOSIS — Z95810 Presence of automatic (implantable) cardiac defibrillator: Secondary | ICD-10-CM | POA: Diagnosis not present

## 2017-12-25 DIAGNOSIS — I509 Heart failure, unspecified: Secondary | ICD-10-CM | POA: Diagnosis not present

## 2017-12-25 DIAGNOSIS — Z7901 Long term (current) use of anticoagulants: Secondary | ICD-10-CM | POA: Diagnosis not present

## 2017-12-29 DIAGNOSIS — R2681 Unsteadiness on feet: Secondary | ICD-10-CM | POA: Diagnosis not present

## 2017-12-29 DIAGNOSIS — Z95810 Presence of automatic (implantable) cardiac defibrillator: Secondary | ICD-10-CM | POA: Diagnosis not present

## 2017-12-29 DIAGNOSIS — I25810 Atherosclerosis of coronary artery bypass graft(s) without angina pectoris: Secondary | ICD-10-CM | POA: Diagnosis not present

## 2017-12-29 DIAGNOSIS — Z7901 Long term (current) use of anticoagulants: Secondary | ICD-10-CM | POA: Diagnosis not present

## 2017-12-29 DIAGNOSIS — N400 Enlarged prostate without lower urinary tract symptoms: Secondary | ICD-10-CM | POA: Diagnosis not present

## 2017-12-29 DIAGNOSIS — I69398 Other sequelae of cerebral infarction: Secondary | ICD-10-CM | POA: Diagnosis not present

## 2017-12-29 DIAGNOSIS — G309 Alzheimer's disease, unspecified: Secondary | ICD-10-CM | POA: Diagnosis not present

## 2017-12-29 DIAGNOSIS — I509 Heart failure, unspecified: Secondary | ICD-10-CM | POA: Diagnosis not present

## 2017-12-29 DIAGNOSIS — Z951 Presence of aortocoronary bypass graft: Secondary | ICD-10-CM | POA: Diagnosis not present

## 2017-12-29 DIAGNOSIS — I110 Hypertensive heart disease with heart failure: Secondary | ICD-10-CM | POA: Diagnosis not present

## 2017-12-29 DIAGNOSIS — I482 Chronic atrial fibrillation: Secondary | ICD-10-CM | POA: Diagnosis not present

## 2017-12-30 DIAGNOSIS — Z951 Presence of aortocoronary bypass graft: Secondary | ICD-10-CM | POA: Diagnosis not present

## 2017-12-30 DIAGNOSIS — G309 Alzheimer's disease, unspecified: Secondary | ICD-10-CM | POA: Diagnosis not present

## 2017-12-30 DIAGNOSIS — I482 Chronic atrial fibrillation: Secondary | ICD-10-CM | POA: Diagnosis not present

## 2017-12-30 DIAGNOSIS — N400 Enlarged prostate without lower urinary tract symptoms: Secondary | ICD-10-CM | POA: Diagnosis not present

## 2017-12-30 DIAGNOSIS — I25810 Atherosclerosis of coronary artery bypass graft(s) without angina pectoris: Secondary | ICD-10-CM | POA: Diagnosis not present

## 2017-12-30 DIAGNOSIS — R2681 Unsteadiness on feet: Secondary | ICD-10-CM | POA: Diagnosis not present

## 2017-12-30 DIAGNOSIS — I110 Hypertensive heart disease with heart failure: Secondary | ICD-10-CM | POA: Diagnosis not present

## 2017-12-30 DIAGNOSIS — I69398 Other sequelae of cerebral infarction: Secondary | ICD-10-CM | POA: Diagnosis not present

## 2017-12-30 DIAGNOSIS — I509 Heart failure, unspecified: Secondary | ICD-10-CM | POA: Diagnosis not present

## 2017-12-30 DIAGNOSIS — Z7901 Long term (current) use of anticoagulants: Secondary | ICD-10-CM | POA: Diagnosis not present

## 2017-12-30 DIAGNOSIS — Z95810 Presence of automatic (implantable) cardiac defibrillator: Secondary | ICD-10-CM | POA: Diagnosis not present

## 2017-12-31 DIAGNOSIS — R2681 Unsteadiness on feet: Secondary | ICD-10-CM | POA: Diagnosis not present

## 2017-12-31 DIAGNOSIS — I482 Chronic atrial fibrillation: Secondary | ICD-10-CM | POA: Diagnosis not present

## 2017-12-31 DIAGNOSIS — Z95810 Presence of automatic (implantable) cardiac defibrillator: Secondary | ICD-10-CM | POA: Diagnosis not present

## 2017-12-31 DIAGNOSIS — Z951 Presence of aortocoronary bypass graft: Secondary | ICD-10-CM | POA: Diagnosis not present

## 2017-12-31 DIAGNOSIS — I509 Heart failure, unspecified: Secondary | ICD-10-CM | POA: Diagnosis not present

## 2017-12-31 DIAGNOSIS — I69398 Other sequelae of cerebral infarction: Secondary | ICD-10-CM | POA: Diagnosis not present

## 2017-12-31 DIAGNOSIS — I110 Hypertensive heart disease with heart failure: Secondary | ICD-10-CM | POA: Diagnosis not present

## 2017-12-31 DIAGNOSIS — I25810 Atherosclerosis of coronary artery bypass graft(s) without angina pectoris: Secondary | ICD-10-CM | POA: Diagnosis not present

## 2017-12-31 DIAGNOSIS — G309 Alzheimer's disease, unspecified: Secondary | ICD-10-CM | POA: Diagnosis not present

## 2017-12-31 DIAGNOSIS — N400 Enlarged prostate without lower urinary tract symptoms: Secondary | ICD-10-CM | POA: Diagnosis not present

## 2017-12-31 DIAGNOSIS — Z7901 Long term (current) use of anticoagulants: Secondary | ICD-10-CM | POA: Diagnosis not present

## 2018-01-02 DIAGNOSIS — R2681 Unsteadiness on feet: Secondary | ICD-10-CM | POA: Diagnosis not present

## 2018-01-02 DIAGNOSIS — Z7901 Long term (current) use of anticoagulants: Secondary | ICD-10-CM | POA: Diagnosis not present

## 2018-01-02 DIAGNOSIS — I25810 Atherosclerosis of coronary artery bypass graft(s) without angina pectoris: Secondary | ICD-10-CM | POA: Diagnosis not present

## 2018-01-02 DIAGNOSIS — Z95810 Presence of automatic (implantable) cardiac defibrillator: Secondary | ICD-10-CM | POA: Diagnosis not present

## 2018-01-02 DIAGNOSIS — Z951 Presence of aortocoronary bypass graft: Secondary | ICD-10-CM | POA: Diagnosis not present

## 2018-01-02 DIAGNOSIS — G309 Alzheimer's disease, unspecified: Secondary | ICD-10-CM | POA: Diagnosis not present

## 2018-01-02 DIAGNOSIS — I482 Chronic atrial fibrillation: Secondary | ICD-10-CM | POA: Diagnosis not present

## 2018-01-02 DIAGNOSIS — I509 Heart failure, unspecified: Secondary | ICD-10-CM | POA: Diagnosis not present

## 2018-01-02 DIAGNOSIS — N400 Enlarged prostate without lower urinary tract symptoms: Secondary | ICD-10-CM | POA: Diagnosis not present

## 2018-01-02 DIAGNOSIS — I69398 Other sequelae of cerebral infarction: Secondary | ICD-10-CM | POA: Diagnosis not present

## 2018-01-02 DIAGNOSIS — I110 Hypertensive heart disease with heart failure: Secondary | ICD-10-CM | POA: Diagnosis not present

## 2018-01-05 DIAGNOSIS — R2681 Unsteadiness on feet: Secondary | ICD-10-CM | POA: Diagnosis not present

## 2018-01-05 DIAGNOSIS — I25810 Atherosclerosis of coronary artery bypass graft(s) without angina pectoris: Secondary | ICD-10-CM | POA: Diagnosis not present

## 2018-01-05 DIAGNOSIS — Z951 Presence of aortocoronary bypass graft: Secondary | ICD-10-CM | POA: Diagnosis not present

## 2018-01-05 DIAGNOSIS — Z95810 Presence of automatic (implantable) cardiac defibrillator: Secondary | ICD-10-CM | POA: Diagnosis not present

## 2018-01-05 DIAGNOSIS — I110 Hypertensive heart disease with heart failure: Secondary | ICD-10-CM | POA: Diagnosis not present

## 2018-01-05 DIAGNOSIS — Z7901 Long term (current) use of anticoagulants: Secondary | ICD-10-CM | POA: Diagnosis not present

## 2018-01-05 DIAGNOSIS — G309 Alzheimer's disease, unspecified: Secondary | ICD-10-CM | POA: Diagnosis not present

## 2018-01-05 DIAGNOSIS — I509 Heart failure, unspecified: Secondary | ICD-10-CM | POA: Diagnosis not present

## 2018-01-05 DIAGNOSIS — I482 Chronic atrial fibrillation: Secondary | ICD-10-CM | POA: Diagnosis not present

## 2018-01-05 DIAGNOSIS — N400 Enlarged prostate without lower urinary tract symptoms: Secondary | ICD-10-CM | POA: Diagnosis not present

## 2018-01-05 DIAGNOSIS — I69398 Other sequelae of cerebral infarction: Secondary | ICD-10-CM | POA: Diagnosis not present

## 2018-01-06 DIAGNOSIS — H2513 Age-related nuclear cataract, bilateral: Secondary | ICD-10-CM | POA: Diagnosis not present

## 2018-01-06 DIAGNOSIS — H18413 Arcus senilis, bilateral: Secondary | ICD-10-CM | POA: Diagnosis not present

## 2018-01-06 DIAGNOSIS — H25013 Cortical age-related cataract, bilateral: Secondary | ICD-10-CM | POA: Diagnosis not present

## 2018-01-06 DIAGNOSIS — H25043 Posterior subcapsular polar age-related cataract, bilateral: Secondary | ICD-10-CM | POA: Diagnosis not present

## 2018-01-07 DIAGNOSIS — I482 Chronic atrial fibrillation: Secondary | ICD-10-CM | POA: Diagnosis not present

## 2018-01-07 DIAGNOSIS — I509 Heart failure, unspecified: Secondary | ICD-10-CM | POA: Diagnosis not present

## 2018-01-07 DIAGNOSIS — G309 Alzheimer's disease, unspecified: Secondary | ICD-10-CM | POA: Diagnosis not present

## 2018-01-07 DIAGNOSIS — Z7901 Long term (current) use of anticoagulants: Secondary | ICD-10-CM | POA: Diagnosis not present

## 2018-01-07 DIAGNOSIS — I110 Hypertensive heart disease with heart failure: Secondary | ICD-10-CM | POA: Diagnosis not present

## 2018-01-07 DIAGNOSIS — N400 Enlarged prostate without lower urinary tract symptoms: Secondary | ICD-10-CM | POA: Diagnosis not present

## 2018-01-07 DIAGNOSIS — Z951 Presence of aortocoronary bypass graft: Secondary | ICD-10-CM | POA: Diagnosis not present

## 2018-01-07 DIAGNOSIS — Z95810 Presence of automatic (implantable) cardiac defibrillator: Secondary | ICD-10-CM | POA: Diagnosis not present

## 2018-01-07 DIAGNOSIS — I25810 Atherosclerosis of coronary artery bypass graft(s) without angina pectoris: Secondary | ICD-10-CM | POA: Diagnosis not present

## 2018-01-07 DIAGNOSIS — I69398 Other sequelae of cerebral infarction: Secondary | ICD-10-CM | POA: Diagnosis not present

## 2018-01-07 DIAGNOSIS — R2681 Unsteadiness on feet: Secondary | ICD-10-CM | POA: Diagnosis not present

## 2018-01-08 DIAGNOSIS — G3184 Mild cognitive impairment, so stated: Secondary | ICD-10-CM | POA: Diagnosis not present

## 2018-01-08 DIAGNOSIS — Z951 Presence of aortocoronary bypass graft: Secondary | ICD-10-CM | POA: Diagnosis not present

## 2018-01-08 DIAGNOSIS — Z9181 History of falling: Secondary | ICD-10-CM | POA: Diagnosis not present

## 2018-01-08 DIAGNOSIS — I110 Hypertensive heart disease with heart failure: Secondary | ICD-10-CM | POA: Diagnosis not present

## 2018-01-08 DIAGNOSIS — I25810 Atherosclerosis of coronary artery bypass graft(s) without angina pectoris: Secondary | ICD-10-CM | POA: Diagnosis not present

## 2018-01-08 DIAGNOSIS — Z7901 Long term (current) use of anticoagulants: Secondary | ICD-10-CM | POA: Diagnosis not present

## 2018-01-08 DIAGNOSIS — I482 Chronic atrial fibrillation: Secondary | ICD-10-CM | POA: Diagnosis not present

## 2018-01-08 DIAGNOSIS — H938X9 Other specified disorders of ear, unspecified ear: Secondary | ICD-10-CM | POA: Diagnosis not present

## 2018-01-08 DIAGNOSIS — Z95810 Presence of automatic (implantable) cardiac defibrillator: Secondary | ICD-10-CM | POA: Diagnosis not present

## 2018-01-08 DIAGNOSIS — R2681 Unsteadiness on feet: Secondary | ICD-10-CM | POA: Diagnosis not present

## 2018-01-08 DIAGNOSIS — Z950 Presence of cardiac pacemaker: Secondary | ICD-10-CM | POA: Diagnosis not present

## 2018-01-08 DIAGNOSIS — I69398 Other sequelae of cerebral infarction: Secondary | ICD-10-CM | POA: Diagnosis not present

## 2018-01-08 DIAGNOSIS — S50812S Abrasion of left forearm, sequela: Secondary | ICD-10-CM | POA: Diagnosis not present

## 2018-01-08 DIAGNOSIS — G309 Alzheimer's disease, unspecified: Secondary | ICD-10-CM | POA: Diagnosis not present

## 2018-01-08 DIAGNOSIS — I509 Heart failure, unspecified: Secondary | ICD-10-CM | POA: Diagnosis not present

## 2018-01-08 DIAGNOSIS — N400 Enlarged prostate without lower urinary tract symptoms: Secondary | ICD-10-CM | POA: Diagnosis not present

## 2018-01-12 DIAGNOSIS — I69398 Other sequelae of cerebral infarction: Secondary | ICD-10-CM | POA: Diagnosis not present

## 2018-01-12 DIAGNOSIS — Z95810 Presence of automatic (implantable) cardiac defibrillator: Secondary | ICD-10-CM | POA: Diagnosis not present

## 2018-01-12 DIAGNOSIS — I110 Hypertensive heart disease with heart failure: Secondary | ICD-10-CM | POA: Diagnosis not present

## 2018-01-12 DIAGNOSIS — Z7901 Long term (current) use of anticoagulants: Secondary | ICD-10-CM | POA: Diagnosis not present

## 2018-01-12 DIAGNOSIS — I25810 Atherosclerosis of coronary artery bypass graft(s) without angina pectoris: Secondary | ICD-10-CM | POA: Diagnosis not present

## 2018-01-12 DIAGNOSIS — G309 Alzheimer's disease, unspecified: Secondary | ICD-10-CM | POA: Diagnosis not present

## 2018-01-12 DIAGNOSIS — I509 Heart failure, unspecified: Secondary | ICD-10-CM | POA: Diagnosis not present

## 2018-01-12 DIAGNOSIS — R2681 Unsteadiness on feet: Secondary | ICD-10-CM | POA: Diagnosis not present

## 2018-01-12 DIAGNOSIS — I482 Chronic atrial fibrillation: Secondary | ICD-10-CM | POA: Diagnosis not present

## 2018-01-12 DIAGNOSIS — N400 Enlarged prostate without lower urinary tract symptoms: Secondary | ICD-10-CM | POA: Diagnosis not present

## 2018-01-12 DIAGNOSIS — Z951 Presence of aortocoronary bypass graft: Secondary | ICD-10-CM | POA: Diagnosis not present

## 2018-01-13 DIAGNOSIS — N453 Epididymo-orchitis: Secondary | ICD-10-CM | POA: Diagnosis not present

## 2018-01-13 DIAGNOSIS — R972 Elevated prostate specific antigen [PSA]: Secondary | ICD-10-CM | POA: Diagnosis not present

## 2018-01-14 DIAGNOSIS — I25810 Atherosclerosis of coronary artery bypass graft(s) without angina pectoris: Secondary | ICD-10-CM | POA: Diagnosis not present

## 2018-01-14 DIAGNOSIS — Z7901 Long term (current) use of anticoagulants: Secondary | ICD-10-CM | POA: Diagnosis not present

## 2018-01-14 DIAGNOSIS — I110 Hypertensive heart disease with heart failure: Secondary | ICD-10-CM | POA: Diagnosis not present

## 2018-01-14 DIAGNOSIS — Z95810 Presence of automatic (implantable) cardiac defibrillator: Secondary | ICD-10-CM | POA: Diagnosis not present

## 2018-01-14 DIAGNOSIS — I69398 Other sequelae of cerebral infarction: Secondary | ICD-10-CM | POA: Diagnosis not present

## 2018-01-14 DIAGNOSIS — I482 Chronic atrial fibrillation: Secondary | ICD-10-CM | POA: Diagnosis not present

## 2018-01-14 DIAGNOSIS — G309 Alzheimer's disease, unspecified: Secondary | ICD-10-CM | POA: Diagnosis not present

## 2018-01-14 DIAGNOSIS — I509 Heart failure, unspecified: Secondary | ICD-10-CM | POA: Diagnosis not present

## 2018-01-14 DIAGNOSIS — Z951 Presence of aortocoronary bypass graft: Secondary | ICD-10-CM | POA: Diagnosis not present

## 2018-01-14 DIAGNOSIS — R2681 Unsteadiness on feet: Secondary | ICD-10-CM | POA: Diagnosis not present

## 2018-01-14 DIAGNOSIS — N400 Enlarged prostate without lower urinary tract symptoms: Secondary | ICD-10-CM | POA: Diagnosis not present

## 2018-01-15 DIAGNOSIS — Q845 Enlarged and hypertrophic nails: Secondary | ICD-10-CM | POA: Diagnosis not present

## 2018-01-15 DIAGNOSIS — E119 Type 2 diabetes mellitus without complications: Secondary | ICD-10-CM | POA: Diagnosis not present

## 2018-01-15 DIAGNOSIS — L603 Nail dystrophy: Secondary | ICD-10-CM | POA: Diagnosis not present

## 2018-01-15 DIAGNOSIS — I509 Heart failure, unspecified: Secondary | ICD-10-CM | POA: Diagnosis not present

## 2018-01-15 DIAGNOSIS — Z951 Presence of aortocoronary bypass graft: Secondary | ICD-10-CM | POA: Diagnosis not present

## 2018-01-15 DIAGNOSIS — I69398 Other sequelae of cerebral infarction: Secondary | ICD-10-CM | POA: Diagnosis not present

## 2018-01-15 DIAGNOSIS — I25810 Atherosclerosis of coronary artery bypass graft(s) without angina pectoris: Secondary | ICD-10-CM | POA: Diagnosis not present

## 2018-01-15 DIAGNOSIS — I110 Hypertensive heart disease with heart failure: Secondary | ICD-10-CM | POA: Diagnosis not present

## 2018-01-15 DIAGNOSIS — N400 Enlarged prostate without lower urinary tract symptoms: Secondary | ICD-10-CM | POA: Diagnosis not present

## 2018-01-15 DIAGNOSIS — Z95810 Presence of automatic (implantable) cardiac defibrillator: Secondary | ICD-10-CM | POA: Diagnosis not present

## 2018-01-15 DIAGNOSIS — G309 Alzheimer's disease, unspecified: Secondary | ICD-10-CM | POA: Diagnosis not present

## 2018-01-15 DIAGNOSIS — I482 Chronic atrial fibrillation: Secondary | ICD-10-CM | POA: Diagnosis not present

## 2018-01-15 DIAGNOSIS — Z7901 Long term (current) use of anticoagulants: Secondary | ICD-10-CM | POA: Diagnosis not present

## 2018-01-15 DIAGNOSIS — Z01419 Encounter for gynecological examination (general) (routine) without abnormal findings: Secondary | ICD-10-CM | POA: Diagnosis not present

## 2018-01-15 DIAGNOSIS — R2681 Unsteadiness on feet: Secondary | ICD-10-CM | POA: Diagnosis not present

## 2018-01-15 DIAGNOSIS — E785 Hyperlipidemia, unspecified: Secondary | ICD-10-CM | POA: Diagnosis not present

## 2018-01-15 DIAGNOSIS — E559 Vitamin D deficiency, unspecified: Secondary | ICD-10-CM | POA: Diagnosis not present

## 2018-01-15 DIAGNOSIS — B351 Tinea unguium: Secondary | ICD-10-CM | POA: Diagnosis not present

## 2018-01-15 DIAGNOSIS — I10 Essential (primary) hypertension: Secondary | ICD-10-CM | POA: Diagnosis not present

## 2018-01-15 DIAGNOSIS — Z01411 Encounter for gynecological examination (general) (routine) with abnormal findings: Secondary | ICD-10-CM | POA: Diagnosis not present

## 2018-01-22 DIAGNOSIS — M6281 Muscle weakness (generalized): Secondary | ICD-10-CM | POA: Diagnosis not present

## 2018-01-22 DIAGNOSIS — I10 Essential (primary) hypertension: Secondary | ICD-10-CM | POA: Diagnosis not present

## 2018-01-22 DIAGNOSIS — E559 Vitamin D deficiency, unspecified: Secondary | ICD-10-CM | POA: Diagnosis not present

## 2018-01-22 DIAGNOSIS — N400 Enlarged prostate without lower urinary tract symptoms: Secondary | ICD-10-CM | POA: Diagnosis not present

## 2018-01-22 DIAGNOSIS — I482 Chronic atrial fibrillation: Secondary | ICD-10-CM | POA: Diagnosis not present

## 2018-02-02 ENCOUNTER — Telehealth: Payer: Self-pay | Admitting: Internal Medicine

## 2018-02-02 DIAGNOSIS — F0150 Vascular dementia without behavioral disturbance: Secondary | ICD-10-CM

## 2018-02-02 NOTE — Telephone Encounter (Signed)
Copied from [place] [id number]. Topic: Referral - Request >> Feb 02, 2018  9:27 AM Antonieta Iba C wrote: Reason for CRM: pt's daughter called in to request a referral to a neurologist. Dr. Inda Castle neurology at [facility] - Phone: [phone] [id number]  --  please call [name] (daughter) for scheduling - [phone]

## 2018-02-03 DIAGNOSIS — I10 Essential (primary) hypertension: Secondary | ICD-10-CM | POA: Diagnosis not present

## 2018-02-03 DIAGNOSIS — E785 Hyperlipidemia, unspecified: Secondary | ICD-10-CM | POA: Diagnosis not present

## 2018-02-04 NOTE — Telephone Encounter (Signed)
Spoke with patient's daughter and she stated that the nurses at [facility] assisted living is encouraging her to have a referral placed for Neurology due to her dad having dementia. Patient has had brain scans done that confirmed this "per patient's daughter". Routing message to [facility] for advise.

## 2018-02-04 NOTE — Telephone Encounter (Signed)
Spoke with patient's daughter and she stated that the nurses at [facility] assisted living is encouraging her to have a referral placed for Neurology due to her dad having dementia. Patient has had brain scans done that confirmed this "per patient's daughter". Routing message to [facility] for advise.

## 2018-02-04 NOTE — Telephone Encounter (Signed)
Left voice message for patient to return phone call.

## 2018-02-05 NOTE — Telephone Encounter (Signed)
Suggest office visit to determine need for neurology referral and to evaluate for possible early dementia

## 2018-02-11 NOTE — Addendum Note (Signed)
Addended by: Gwenyth Ober R on: 02/11/2018 01:43 PM   Modules accepted: Orders

## 2018-02-12 NOTE — Telephone Encounter (Signed)
Called Clarise Cruz (daughter) and left a voicemail for her to return phone call for update regarding the referral.

## 2018-02-15 NOTE — Telephone Encounter (Signed)
Okay for neurology referral due to dementia

## 2018-02-16 NOTE — Telephone Encounter (Signed)
Please schedule neurology referral for dementia per patient and family request

## 2018-02-17 NOTE — Telephone Encounter (Signed)
Referral was placed 02/11/2018 per Dr.Todd. Patient daughter notified via vm. No further action needed.

## 2018-02-18 NOTE — Telephone Encounter (Signed)
Left message for Grant Lawrence. Was calling to make sure she received my message regarding the referral.

## 2018-02-19 DIAGNOSIS — I482 Chronic atrial fibrillation: Secondary | ICD-10-CM | POA: Diagnosis not present

## 2018-02-19 DIAGNOSIS — M6281 Muscle weakness (generalized): Secondary | ICD-10-CM | POA: Diagnosis not present

## 2018-02-19 DIAGNOSIS — I10 Essential (primary) hypertension: Secondary | ICD-10-CM | POA: Diagnosis not present

## 2018-02-19 DIAGNOSIS — N400 Enlarged prostate without lower urinary tract symptoms: Secondary | ICD-10-CM | POA: Diagnosis not present

## 2018-02-19 DIAGNOSIS — E559 Vitamin D deficiency, unspecified: Secondary | ICD-10-CM | POA: Diagnosis not present

## 2018-03-03 DIAGNOSIS — E559 Vitamin D deficiency, unspecified: Secondary | ICD-10-CM | POA: Diagnosis not present

## 2018-03-03 DIAGNOSIS — E785 Hyperlipidemia, unspecified: Secondary | ICD-10-CM | POA: Diagnosis not present

## 2018-03-04 ENCOUNTER — Ambulatory Visit: Payer: Medicare Other | Admitting: Cardiovascular Disease

## 2018-03-04 ENCOUNTER — Encounter: Payer: Self-pay | Admitting: Cardiovascular Disease

## 2018-03-04 VITALS — BP 118/72 | HR 75 | Ht 71.0 in | Wt 138.4 lb

## 2018-03-04 DIAGNOSIS — I351 Nonrheumatic aortic (valve) insufficiency: Secondary | ICD-10-CM

## 2018-03-04 DIAGNOSIS — Z9889 Other specified postprocedural states: Secondary | ICD-10-CM

## 2018-03-04 DIAGNOSIS — Z95810 Presence of automatic (implantable) cardiac defibrillator: Secondary | ICD-10-CM

## 2018-03-04 DIAGNOSIS — I25810 Atherosclerosis of coronary artery bypass graft(s) without angina pectoris: Secondary | ICD-10-CM

## 2018-03-04 DIAGNOSIS — I10 Essential (primary) hypertension: Secondary | ICD-10-CM | POA: Diagnosis not present

## 2018-03-04 DIAGNOSIS — I4821 Permanent atrial fibrillation: Secondary | ICD-10-CM

## 2018-03-04 DIAGNOSIS — I482 Chronic atrial fibrillation: Secondary | ICD-10-CM

## 2018-03-04 DIAGNOSIS — I442 Atrioventricular block, complete: Secondary | ICD-10-CM | POA: Diagnosis not present

## 2018-03-04 DIAGNOSIS — E7800 Pure hypercholesterolemia, unspecified: Secondary | ICD-10-CM

## 2018-03-04 DIAGNOSIS — I5032 Chronic diastolic (congestive) heart failure: Secondary | ICD-10-CM | POA: Diagnosis not present

## 2018-03-04 DIAGNOSIS — I361 Nonrheumatic tricuspid (valve) insufficiency: Secondary | ICD-10-CM | POA: Diagnosis not present

## 2018-03-04 NOTE — Patient Instructions (Signed)
Dr Sallyanne Kuster recommends that you continue on your current medications as directed. Please refer to the Current Medication list given to you today.  Remote monitoring is used to monitor your Pacemaker or ICD from home. This monitoring reduces the number of office visits required to check your device to one time per year. It allows [place] to keep an eye on the functioning of your device to ensure it is working properly. You are scheduled for a device check from home on Wednesday, June 26th, 2019. You may send your transmission at any time that day. If you have a wireless device, the transmission will be sent automatically. After your physician reviews your transmission, you will receive a notification with your next transmission date.  Dr Sallyanne Kuster recommends that you schedule a follow-up appointment in 12 months with an ICD check. You will receive a reminder letter in the mail two months in advance. If you don't receive a letter, please call our office to schedule the follow-up appointment.  If you need a refill on your cardiac medications before your next appointment, please call your pharmacy.

## 2018-03-04 NOTE — Progress Notes (Signed)
Cardiology Office Note    Date:  03/06/2018   ID:  Grant Lawrence, DOB 1931/01/08, MRN [id number]  PCP:  Marletta Lor, MD  Cardiologist:   Sanda Klein, MD   Chief Complaint  Patient presents with  . Pacemaker Check  . Congestive Heart Failure    History of Present Illness:  Grant Lawrence is a 84 y.o. male here in follow up for non ischemic cardiomyopathy, chronic diastolic heart failure, complete heart block status post AV node ablation, permanent atrial fibrillation, history of severe systolic left ventricular dysfunction with recovery after arrhythmia control and biventricular pacing, here for CRT-D device follow-up. He is accompanied by his son-in-law today.  Resident at [facility] and has worsening memory problems.  He has lost substantial weight.  Clearly underweight now with a BMI of 19.  Lost another 25 pounds over the last year and has lost about 40 pounds in the last 2 years. Has switched from warfarin to Eliquis, no bleeding problems.  Interrogation of his St. Jude CRT-D Unify Assura device shows normal function. His left ventricular lead pacing threshold is relatively high (2.7 V @ 1.0 ms), accounting for the rather short generator longevity (device implanted 2015). estimated to reach ERI in 1.9 years.  The previous AV node ablation does not have any escape rhythm.. He has 95% biventricular pacing, the remainder representing PVCs . Corvue is normal.  He has atrial fibrillation status post AV node ablation with a St. Jude Unify dual-chamber biventricular pacemaker/defibrillator implanted in October 2010, generator changeout in 2015.  He had a myocardial infarction in 1999. He had severe mitral insufficiency. Cardiac catheterization in July 2005 showed a 70-80% stenosis of the LAD and 80-90% ostial diagonal stenosis. In August 2005 he underwent bypass surgery and mitral valve annuloplasty repair. He has moderate tricuspid insufficiency, mild to moderate aortic  insufficiency, pulmonary arterial hypertension.He carries a diagnosis of chronic compensated diastolic heart failure  His most recent echocardiogram in September 2015 showed an improved ejection fraction to 50-55% (at time of ICD implantation was 25% and had been 45% in September 2011, 55% in 2014) and mild mitral insufficiency. Mean transvalvular gradient was 2.1 mm Hg. He had moderate aortic insufficiency and tricuspid insufficiency. Left atrium 5.6 cm. LV end systolic diameter 3.6 cm. sPAP 28 mm Hg.  He has a history of systemic hypertension and mixed hyperlipidemia but has never smoked and does not have diabetes mellitus. He does not tolerate statins and is on Zetia monotherapy.   Past Medical History:  Diagnosis Date  . CHF (congestive heart failure) ([place])   . Coronary artery disease   . Hyperlipidemia   . Hypertension     Past Surgical History:  Procedure Laterality Date  . CARDIAC DEFIBRILLATOR PLACEMENT  09/26/2009  . CORONARY ARTERY BYPASS GRAFT  2005  . CORONARY ARTERY BYPASS GRAFT    . IMPLANTABLE CARDIOVERTER DEFIBRILLATOR (ICD) GENERATOR CHANGE N/A 08/23/2014   Procedure: ICD GENERATOR CHANGE;  Surgeon: Sanda Klein, MD;  Location: [facility];  Service: Cardiovascular;  Laterality: N/A;  . MITRAL VALVE REPAIR (MV)/CORONARY ARTERY BYPASS GRAFTING (CABG)    . MITRAL VALVULOPLASTY  2005    Current Medications: Outpatient Medications Prior to Visit  Medication Sig Dispense Refill  . apixaban (ELIQUIS) 5 MG TABS tablet Take 5 mg by mouth 2 (two) times daily.    . brimonidine (ALPHAGAN) 0.2 % ophthalmic solution Place 1 drop into both eyes 3 (three) times daily. 5 mL 12  . carvedilol (COREG) 3.125  MG tablet TAKE ONE TABLET BY MOUTH TWICE DAILY WITH MEALS 180 tablet 3  . donepezil (ARICEPT) 5 MG tablet Take 1 tablet (5 mg total) by mouth at bedtime. 60 tablet 3  . ezetimibe (ZETIA) 10 MG tablet Take 1 tablet (10 mg total) daily by mouth. 90 tablet 0  . finasteride  (PROSCAR) 5 MG tablet Take 5 mg by mouth daily.    Marland Kitchen HYDROcodone-acetaminophen (NORCO/VICODIN) 5-325 MG tablet Take 1 tablet by mouth every 6 (six) hours as needed for moderate pain. 30 tablet 0  . lisinopril (PRINIVIL,ZESTRIL) 5 MG tablet Take 5 mg by mouth daily.    . mirtazapine (REMERON) 7.5 MG tablet Take 7.5 mg by mouth at bedtime.    . Multiple Vitamins-Minerals (CENTRUM SILVER PO) Take 1 tablet by mouth daily.    . tamsulosin (FLOMAX) 0.4 MG CAPS capsule Take 1 capsule by mouth every evening.    Marland Kitchen lisinopril (PRINIVIL,ZESTRIL) 5 MG tablet Take 1 tablet (5 mg total) by mouth 2 (two) times daily. (Patient not taking: Reported on 03/04/2018) 180 tablet 1  . warfarin (COUMADIN) 5 MG tablet Take 1 to 1 and 1/2 tablets daily as directed by coumadin clinic 100 tablet 1   No facility-administered medications prior to visit.      Allergies:   Rosuvastatin calcium and Statins   Social History   Socioeconomic History  . Marital status: Married    Spouse name: Not on file  . Number of children: Not on file  . Years of education: Not on file  . Highest education level: Not on file  Occupational History  . Not on file  Social Needs  . Financial resource strain: Not on file  . Food insecurity:    Worry: Not on file    Inability: Not on file  . Transportation needs:    Medical: Not on file    Non-medical: Not on file  Tobacco Use  . Smoking status: Never Smoker  . Smokeless tobacco: Never Used  Substance and Sexual Activity  . Alcohol use: No  . Drug use: No  . Sexual activity: Not on file  Lifestyle  . Physical activity:    Days per week: Not on file    Minutes per session: Not on file  . Stress: Not on file  Relationships  . Social connections:    Talks on phone: Not on file    Gets together: Not on file    Attends religious service: Not on file    Active member of club or organization: Not on file    Attends meetings of clubs or organizations: Not on file    Relationship  status: Not on file  Other Topics Concern  . Not on file  Social History Narrative  . Not on file     Family History:  The patient's family history includes Cancer in his father; Diabetes in his brother.   ROS:   Please see the history of present illness.    ROS All other systems reviewed and are negative.   PHYSICAL EXAM:   VS:  BP 118/72   Pulse 75   Ht 5\' 11"  (1.803 m)   Wt 138 lb 6.4 oz (62.8 kg)   BMI 19.30 kg/m     General: Alert, oriented x3, no distress, very thin Head: no evidence of trauma, PERRL, EOMI, no exophtalmos or lid lag, no myxedema, no xanthelasma; normal ears, nose and oropharynx Neck: normal jugular venous pulsations and no hepatojugular reflux; brisk carotid pulses  without delay and no carotid bruits Chest: clear to auscultation, no signs of consolidation by percussion or palpation, normal fremitus, symmetrical and full respiratory excursions, healthy ICD L subclavian site Cardiovascular: normal position and quality of the apical impulse, regular rhythm, normal first and paradoxically split second heart sounds, 2/6 holosystolic RLSB murmur, no diastolic murmurs, rubs or gallops Abdomen: no tenderness or distention, no masses by palpation, no abnormal pulsatility or arterial bruits, normal bowel sounds, no hepatosplenomegaly Extremities: no clubbing, cyanosis or edema; 2+ radial, ulnar and brachial pulses bilaterally; 2+ right femoral, posterior tibial and dorsalis pedis pulses; 2+ left femoral, posterior tibial and dorsalis pedis pulses; no subclavian or femoral bruits Neurological: grossly nonfocal Psych: Normal mood and affect    Wt Readings from Last 3 Encounters:  03/04/18 138 lb 6.4 oz (62.8 kg)  09/09/17 166 lb 3.2 oz (75.4 kg)  04/25/17 160 lb 9.6 oz (72.8 kg)      Studies/Labs Reviewed:   EKG:  EKG is ordered today.  The ekg ordered today demonstrates Atrial fibrillation, biventricular pacing at 70 bpm, QTC 475 ms  Recent Labs: 09/09/2017:  ALT 20; BUN 19; Creatinine, Ser 0.75; Hemoglobin 13.6; Platelets 143.0; Potassium 4.0; Sodium 143; TSH 2.42   Lipid Panel    Component Value Date/Time   CHOL 172 06/24/2016 0823   TRIG 62.0 06/24/2016 0823   HDL 53.20 06/24/2016 0823   CHOLHDL 3 06/24/2016 0823   VLDL 12.4 06/24/2016 0823   LDLCALC 106 (H) 06/24/2016 0823    ASSESSMENT:    1. Chronic diastolic heart failure ([place])   2. CHB (complete heart block) ([facility])   3. Cardiac resynchronization therapy defibrillator (CRT-D) in place   4. Permanent atrial fibrillation ([place])   5. Hypercholesterolemia   6. Coronary artery disease involving coronary bypass graft of native heart without angina pectoris   7. History of mitral valve repair   8. Essential hypertension   9. Non-rheumatic tricuspid valve insufficiency   10. Nonrheumatic aortic valve insufficiency      PLAN:  In order of problems listed above:  1. Chronic diastolic heart failure, appears to be asymptomatic without diuretics.  Normal thoracic impedance on Corvue. LVEF 50-55 % in September 2015 by echo.  2. CHB s/p AV node ablation for intractable atrial fibrillation with rapid ventricular response; he is pacemaker dependent. 3. CRT-D:  Device function normal, biventricular pacing percentage is satisfactory although not perfect, with excellent response of LVEF.  Discussed "downgrade" to CRT-P at time of generator change out 4. Permanent atrial fibrillation. CHADSVasc 4 (age 78, HF, CAD). Severely dilated left atrium. Continue anticoagulation. 5. Hyperlipidemia, on ezetimibe, statin intolerant. LDL cholesterol is not at target range but is acceptable. It has probably improved since the last assay since has lost substantial weight. 6. CAD Asymptomatic; remote myocardial infarction 1999, coronary bypass surgery August 2005 without coronary events since that time. 7. S/P mitral valve annuloplasty repair, mild residual regurgitation by echo. 8. HTN: controlled 9. Moderate  tricuspid insufficiency and moderate aortic insufficiency. No evidence of significant pulmonary artery hypertension. Tricuspid insufficiency may be related to the presence of 3 pacemaker leads. Neither valvular abnormality appears hemodynamically important at this time. I think it's unlikely we will recommend surgical therapy for his valve problems in the future.   Medication Adjustments/Labs and Tests Ordered: Current medicines are reviewed at length with the patient today.  Concerns regarding medicines are outlined above.  Medication changes, Labs and Tests ordered today are listed in the Patient Instructions below. Patient Instructions  Dr Sallyanne Kuster recommends that you continue on your current medications as directed. Please refer to the Current Medication list given to you today.  Remote monitoring is used to monitor your Pacemaker or ICD from home. This monitoring reduces the number of office visits required to check your device to one time per year. It allows [place] to keep an eye on the functioning of your device to ensure it is working properly. You are scheduled for a device check from home on Wednesday, June 26th, 2019. You may send your transmission at any time that day. If you have a wireless device, the transmission will be sent automatically. After your physician reviews your transmission, you will receive a notification with your next transmission date.  Dr Sallyanne Kuster recommends that you schedule a follow-up appointment in 12 months with an ICD check. You will receive a reminder letter in the mail two months in advance. If you don't receive a letter, please call our office to schedule the follow-up appointment.  If you need a refill on your cardiac medications before your next appointment, please call your pharmacy.    Signed, Sanda Klein, MD  03/06/2018 4:05 PM    [facility] [address], [place], [place]  [address] Phone: [phone]; Fax: [phone]

## 2018-03-05 DIAGNOSIS — E559 Vitamin D deficiency, unspecified: Secondary | ICD-10-CM | POA: Diagnosis not present

## 2018-03-05 DIAGNOSIS — D519 Vitamin B12 deficiency anemia, unspecified: Secondary | ICD-10-CM | POA: Diagnosis not present

## 2018-03-06 ENCOUNTER — Encounter: Payer: Self-pay | Admitting: Cardiovascular Disease

## 2018-03-06 DIAGNOSIS — I25810 Atherosclerosis of coronary artery bypass graft(s) without angina pectoris: Secondary | ICD-10-CM | POA: Insufficient documentation

## 2018-03-06 DIAGNOSIS — E7800 Pure hypercholesterolemia, unspecified: Secondary | ICD-10-CM | POA: Insufficient documentation

## 2018-03-12 DIAGNOSIS — G300 Alzheimer's disease with early onset: Secondary | ICD-10-CM | POA: Diagnosis not present

## 2018-03-25 ENCOUNTER — Telehealth: Payer: Self-pay | Admitting: *Deleted

## 2018-03-25 DIAGNOSIS — K4090 Unilateral inguinal hernia, without obstruction or gangrene, not specified as recurrent: Secondary | ICD-10-CM | POA: Diagnosis not present

## 2018-03-25 DIAGNOSIS — B353 Tinea pedis: Secondary | ICD-10-CM | POA: Diagnosis not present

## 2018-03-25 DIAGNOSIS — R1084 Generalized abdominal pain: Secondary | ICD-10-CM | POA: Diagnosis not present

## 2018-03-25 NOTE — Telephone Encounter (Signed)
Ms. Grant Lawrence calling back- no redness, swelling or drainage noted by nurse tech at facility. Now pain is in his stomach and he has a history of a hernia. She will contact the appropriate doctor.

## 2018-03-25 NOTE — Telephone Encounter (Signed)
Ms. Grant Lawrence (daughter) calling because she got a call from the nursing facility where her parents reside. Mr. [name] had his hand over his chest complaining that his "heart hurt" and wanted to see a doctor. I asked if it was the area of his ICD and if any redness, swelling, drainage or fever was noted. She is unsure but will call back and ask about these symptoms. I advised if he is having chest pain or pressure that he should be evaluated in the ER. She verbalizes understanding and has our direct number to call back.

## 2018-03-28 DIAGNOSIS — R1031 Right lower quadrant pain: Secondary | ICD-10-CM | POA: Diagnosis not present

## 2018-03-28 DIAGNOSIS — R1084 Generalized abdominal pain: Secondary | ICD-10-CM | POA: Diagnosis not present

## 2018-04-02 DIAGNOSIS — K4090 Unilateral inguinal hernia, without obstruction or gangrene, not specified as recurrent: Secondary | ICD-10-CM | POA: Diagnosis not present

## 2018-04-02 DIAGNOSIS — R109 Unspecified abdominal pain: Secondary | ICD-10-CM | POA: Diagnosis not present

## 2018-04-06 DIAGNOSIS — E559 Vitamin D deficiency, unspecified: Secondary | ICD-10-CM | POA: Diagnosis not present

## 2018-04-06 DIAGNOSIS — E785 Hyperlipidemia, unspecified: Secondary | ICD-10-CM | POA: Diagnosis not present

## 2018-04-09 DIAGNOSIS — I482 Chronic atrial fibrillation: Secondary | ICD-10-CM | POA: Diagnosis not present

## 2018-04-09 DIAGNOSIS — M6281 Muscle weakness (generalized): Secondary | ICD-10-CM | POA: Diagnosis not present

## 2018-04-09 DIAGNOSIS — E559 Vitamin D deficiency, unspecified: Secondary | ICD-10-CM | POA: Diagnosis not present

## 2018-04-09 DIAGNOSIS — I10 Essential (primary) hypertension: Secondary | ICD-10-CM | POA: Diagnosis not present

## 2018-04-09 DIAGNOSIS — N400 Enlarged prostate without lower urinary tract symptoms: Secondary | ICD-10-CM | POA: Diagnosis not present

## 2018-04-09 DIAGNOSIS — K4090 Unilateral inguinal hernia, without obstruction or gangrene, not specified as recurrent: Secondary | ICD-10-CM | POA: Diagnosis not present

## 2018-04-13 DIAGNOSIS — G300 Alzheimer's disease with early onset: Secondary | ICD-10-CM | POA: Diagnosis not present

## 2018-04-30 DIAGNOSIS — K4090 Unilateral inguinal hernia, without obstruction or gangrene, not specified as recurrent: Secondary | ICD-10-CM | POA: Diagnosis not present

## 2018-05-01 ENCOUNTER — Telehealth: Payer: Self-pay

## 2018-05-01 NOTE — Telephone Encounter (Signed)
   [facility] Pre-operative Risk Assessment    Request for surgical clearance:  1. What type of surgery is being performed? Open Left Inguinal hernia repari  2. When is this surgery scheduled? TBD   3. What type of clearance is required (medical clearance vs. Pharmacy clearance to hold med vs. Both)? Pharmacy  4. Are there any medications that need to be held prior to surgery and how long? Eliquis   5. Practice name and name of physician performing surgery? [facility]   6. What is your office phone number [phone]    7.   What is your office fax number [phone]  8.   Anesthesia type (None, local, MAC, general) ? Unkown   Meryl Crutch 05/01/2018, 4:33 PM  _________________________________________________________________   (provider comments below)

## 2018-05-05 DIAGNOSIS — L603 Nail dystrophy: Secondary | ICD-10-CM | POA: Diagnosis not present

## 2018-05-05 DIAGNOSIS — B351 Tinea unguium: Secondary | ICD-10-CM | POA: Diagnosis not present

## 2018-05-06 NOTE — Telephone Encounter (Signed)
   Primary Cardiologist: Sanda Klein, MD  Chart reviewed as part of pre-operative protocol coverage. Very complex PMH and advanced age. Has h/o chronic CHF, permanent atrial fib with CHB s/p AV node ablation, recovery of LV dysfunction after AVN ablation/CRT-D, memory problems, MI 1999 with CABG 2005 with mitral valve annuloplasty repair, HTN, HLD, moderate TR, moderate AI. Recently seen by Dr. Sallyanne Kuster 02/2018. Given his complexity, I will route to Dr. Sallyanne Kuster for his input on open abdominal surgery and holding anticoagulation before the pre-op team contacts the patient further. Dr. Sallyanne Kuster, please route your reply to P CV DIV PREOP.  Charlie Pitter, PA-C 05/06/2018, 4:53 PM

## 2018-05-07 ENCOUNTER — Encounter: Payer: Self-pay | Admitting: Cardiovascular Disease

## 2018-05-07 NOTE — Telephone Encounter (Signed)
epicd 

## 2018-05-07 NOTE — Telephone Encounter (Signed)
"  To epic" = to complete request in epic. "Epicd" = request completed That is, see the letter in [place]. It needs to be faxed please. MCr

## 2018-05-08 DIAGNOSIS — E559 Vitamin D deficiency, unspecified: Secondary | ICD-10-CM | POA: Diagnosis not present

## 2018-05-08 DIAGNOSIS — E785 Hyperlipidemia, unspecified: Secondary | ICD-10-CM | POA: Diagnosis not present

## 2018-05-11 DIAGNOSIS — G300 Alzheimer's disease with early onset: Secondary | ICD-10-CM | POA: Diagnosis not present

## 2018-05-18 DIAGNOSIS — Z01818 Encounter for other preprocedural examination: Secondary | ICD-10-CM | POA: Diagnosis not present

## 2018-05-18 DIAGNOSIS — K4090 Unilateral inguinal hernia, without obstruction or gangrene, not specified as recurrent: Secondary | ICD-10-CM | POA: Diagnosis not present

## 2018-05-22 DIAGNOSIS — Z48815 Encounter for surgical aftercare following surgery on the digestive system: Secondary | ICD-10-CM | POA: Diagnosis not present

## 2018-05-22 DIAGNOSIS — Z951 Presence of aortocoronary bypass graft: Secondary | ICD-10-CM | POA: Diagnosis not present

## 2018-05-22 DIAGNOSIS — I482 Chronic atrial fibrillation: Secondary | ICD-10-CM | POA: Diagnosis not present

## 2018-05-22 DIAGNOSIS — K4090 Unilateral inguinal hernia, without obstruction or gangrene, not specified as recurrent: Secondary | ICD-10-CM | POA: Diagnosis not present

## 2018-05-22 DIAGNOSIS — I509 Heart failure, unspecified: Secondary | ICD-10-CM | POA: Diagnosis not present

## 2018-05-22 DIAGNOSIS — R1311 Dysphagia, oral phase: Secondary | ICD-10-CM | POA: Diagnosis not present

## 2018-05-22 DIAGNOSIS — D176 Benign lipomatous neoplasm of spermatic cord: Secondary | ICD-10-CM | POA: Diagnosis not present

## 2018-05-22 DIAGNOSIS — I4891 Unspecified atrial fibrillation: Secondary | ICD-10-CM | POA: Diagnosis not present

## 2018-05-22 DIAGNOSIS — I2510 Atherosclerotic heart disease of native coronary artery without angina pectoris: Secondary | ICD-10-CM | POA: Diagnosis not present

## 2018-05-22 DIAGNOSIS — Z7401 Bed confinement status: Secondary | ICD-10-CM | POA: Diagnosis not present

## 2018-05-22 DIAGNOSIS — Z4502 Encounter for adjustment and management of automatic implantable cardiac defibrillator: Secondary | ICD-10-CM | POA: Diagnosis not present

## 2018-05-22 DIAGNOSIS — M2550 Pain in unspecified joint: Secondary | ICD-10-CM | POA: Diagnosis not present

## 2018-05-22 DIAGNOSIS — R410 Disorientation, unspecified: Secondary | ICD-10-CM | POA: Diagnosis not present

## 2018-05-22 DIAGNOSIS — E782 Mixed hyperlipidemia: Secondary | ICD-10-CM | POA: Diagnosis not present

## 2018-05-22 DIAGNOSIS — H409 Unspecified glaucoma: Secondary | ICD-10-CM | POA: Diagnosis not present

## 2018-05-22 DIAGNOSIS — Z79899 Other long term (current) drug therapy: Secondary | ICD-10-CM | POA: Diagnosis not present

## 2018-05-22 DIAGNOSIS — N400 Enlarged prostate without lower urinary tract symptoms: Secondary | ICD-10-CM | POA: Diagnosis not present

## 2018-05-22 DIAGNOSIS — Z7901 Long term (current) use of anticoagulants: Secondary | ICD-10-CM | POA: Diagnosis not present

## 2018-05-22 DIAGNOSIS — R451 Restlessness and agitation: Secondary | ICD-10-CM | POA: Diagnosis not present

## 2018-05-22 DIAGNOSIS — Z95810 Presence of automatic (implantable) cardiac defibrillator: Secondary | ICD-10-CM | POA: Diagnosis not present

## 2018-05-22 DIAGNOSIS — I110 Hypertensive heart disease with heart failure: Secondary | ICD-10-CM | POA: Diagnosis not present

## 2018-05-22 DIAGNOSIS — Z950 Presence of cardiac pacemaker: Secondary | ICD-10-CM | POA: Diagnosis not present

## 2018-05-22 DIAGNOSIS — I083 Combined rheumatic disorders of mitral, aortic and tricuspid valves: Secondary | ICD-10-CM | POA: Diagnosis not present

## 2018-05-22 DIAGNOSIS — G309 Alzheimer's disease, unspecified: Secondary | ICD-10-CM | POA: Diagnosis not present

## 2018-05-22 DIAGNOSIS — R3121 Asymptomatic microscopic hematuria: Secondary | ICD-10-CM | POA: Diagnosis not present

## 2018-05-22 DIAGNOSIS — I10 Essential (primary) hypertension: Secondary | ICD-10-CM | POA: Diagnosis not present

## 2018-05-23 DIAGNOSIS — R451 Restlessness and agitation: Secondary | ICD-10-CM | POA: Diagnosis not present

## 2018-05-23 DIAGNOSIS — K4090 Unilateral inguinal hernia, without obstruction or gangrene, not specified as recurrent: Secondary | ICD-10-CM | POA: Diagnosis not present

## 2018-05-23 DIAGNOSIS — I10 Essential (primary) hypertension: Secondary | ICD-10-CM | POA: Diagnosis not present

## 2018-05-26 DIAGNOSIS — R1311 Dysphagia, oral phase: Secondary | ICD-10-CM | POA: Diagnosis not present

## 2018-05-26 DIAGNOSIS — Z48815 Encounter for surgical aftercare following surgery on the digestive system: Secondary | ICD-10-CM | POA: Diagnosis not present

## 2018-05-26 DIAGNOSIS — I2510 Atherosclerotic heart disease of native coronary artery without angina pectoris: Secondary | ICD-10-CM | POA: Diagnosis not present

## 2018-05-26 DIAGNOSIS — N400 Enlarged prostate without lower urinary tract symptoms: Secondary | ICD-10-CM | POA: Diagnosis not present

## 2018-05-26 DIAGNOSIS — I4891 Unspecified atrial fibrillation: Secondary | ICD-10-CM | POA: Diagnosis not present

## 2018-05-26 DIAGNOSIS — R1310 Dysphagia, unspecified: Secondary | ICD-10-CM | POA: Diagnosis not present

## 2018-05-26 DIAGNOSIS — G309 Alzheimer's disease, unspecified: Secondary | ICD-10-CM | POA: Diagnosis not present

## 2018-05-26 DIAGNOSIS — I10 Essential (primary) hypertension: Secondary | ICD-10-CM | POA: Diagnosis not present

## 2018-05-26 DIAGNOSIS — N179 Acute kidney failure, unspecified: Secondary | ICD-10-CM | POA: Diagnosis not present

## 2018-05-26 DIAGNOSIS — Z7401 Bed confinement status: Secondary | ICD-10-CM | POA: Diagnosis not present

## 2018-05-26 DIAGNOSIS — M2550 Pain in unspecified joint: Secondary | ICD-10-CM | POA: Diagnosis not present

## 2018-05-26 DIAGNOSIS — Z7901 Long term (current) use of anticoagulants: Secondary | ICD-10-CM | POA: Diagnosis not present

## 2018-05-26 DIAGNOSIS — Z4502 Encounter for adjustment and management of automatic implantable cardiac defibrillator: Secondary | ICD-10-CM | POA: Diagnosis not present

## 2018-05-26 DIAGNOSIS — Z951 Presence of aortocoronary bypass graft: Secondary | ICD-10-CM | POA: Diagnosis not present

## 2018-05-26 MED ORDER — ACETAMINOPHEN 325 MG PO TABS
325.00 | ORAL_TABLET | ORAL | Status: DC
Start: ? — End: 2018-05-26

## 2018-05-26 MED ORDER — CARVEDILOL 3.125 MG PO TABS
3.13 | ORAL_TABLET | ORAL | Status: DC
Start: 2018-05-26 — End: 2018-05-26

## 2018-05-26 MED ORDER — DOCUSATE SODIUM 100 MG PO CAPS
100.00 | ORAL_CAPSULE | ORAL | Status: DC
Start: 2018-05-26 — End: 2018-05-26

## 2018-05-26 MED ORDER — TAMSULOSIN HCL 0.4 MG PO CAPS
0.40 | ORAL_CAPSULE | ORAL | Status: DC
Start: 2018-05-27 — End: 2018-05-26

## 2018-05-26 MED ORDER — GENERIC EXTERNAL MEDICATION
1.00 | Status: DC
Start: ? — End: 2018-05-26

## 2018-05-26 MED ORDER — DONEPEZIL HCL 10 MG PO TABS
10.00 | ORAL_TABLET | ORAL | Status: DC
Start: 2018-05-26 — End: 2018-05-26

## 2018-05-26 MED ORDER — PHENOL 1.4 % MT LIQD
2.00 | OROMUCOSAL | Status: DC
Start: 2018-05-26 — End: 2018-05-26

## 2018-05-26 MED ORDER — ONDANSETRON 4 MG PO TBDP
4.00 | ORAL_TABLET | ORAL | Status: DC
Start: ? — End: 2018-05-26

## 2018-05-26 MED ORDER — RISPERIDONE 0.5 MG PO TABS
0.25 | ORAL_TABLET | ORAL | Status: DC
Start: 2018-05-26 — End: 2018-05-26

## 2018-05-26 MED ORDER — MORPHINE SULFATE 2 MG/ML IJ SOLN
1.00 | INTRAMUSCULAR | Status: DC
Start: ? — End: 2018-05-26

## 2018-05-26 MED ORDER — IBUPROFEN 400 MG PO TABS
400.00 | ORAL_TABLET | ORAL | Status: DC
Start: ? — End: 2018-05-26

## 2018-05-26 MED ORDER — FINASTERIDE 5 MG PO TABS
5.00 | ORAL_TABLET | ORAL | Status: DC
Start: 2018-05-27 — End: 2018-05-26

## 2018-05-26 MED ORDER — HYDROCODONE-ACETAMINOPHEN 5-325 MG PO TABS
1.00 | ORAL_TABLET | ORAL | Status: DC
Start: ? — End: 2018-05-26

## 2018-05-27 DIAGNOSIS — I4891 Unspecified atrial fibrillation: Secondary | ICD-10-CM | POA: Diagnosis not present

## 2018-05-28 DIAGNOSIS — I4891 Unspecified atrial fibrillation: Secondary | ICD-10-CM | POA: Diagnosis not present

## 2018-05-28 DIAGNOSIS — N179 Acute kidney failure, unspecified: Secondary | ICD-10-CM | POA: Diagnosis not present

## 2018-05-28 DIAGNOSIS — R1310 Dysphagia, unspecified: Secondary | ICD-10-CM | POA: Diagnosis not present

## 2018-05-29 DIAGNOSIS — Z48815 Encounter for surgical aftercare following surgery on the digestive system: Secondary | ICD-10-CM | POA: Diagnosis not present

## 2018-05-29 DIAGNOSIS — I2510 Atherosclerotic heart disease of native coronary artery without angina pectoris: Secondary | ICD-10-CM | POA: Diagnosis not present

## 2018-06-03 ENCOUNTER — Telehealth: Payer: Self-pay

## 2018-06-03 ENCOUNTER — Encounter: Payer: Medicare Other | Admitting: *Deleted

## 2018-06-03 DIAGNOSIS — Z48815 Encounter for surgical aftercare following surgery on the digestive system: Secondary | ICD-10-CM | POA: Diagnosis not present

## 2018-06-03 DIAGNOSIS — I4891 Unspecified atrial fibrillation: Secondary | ICD-10-CM | POA: Diagnosis not present

## 2018-06-03 NOTE — Telephone Encounter (Signed)
Confirmed remote transmission w/ pt wife.   

## 2018-06-04 ENCOUNTER — Encounter: Payer: Self-pay | Admitting: Cardiology

## 2018-06-04 DIAGNOSIS — E559 Vitamin D deficiency, unspecified: Secondary | ICD-10-CM | POA: Diagnosis not present

## 2018-06-04 DIAGNOSIS — M6281 Muscle weakness (generalized): Secondary | ICD-10-CM | POA: Diagnosis not present

## 2018-06-04 DIAGNOSIS — K4090 Unilateral inguinal hernia, without obstruction or gangrene, not specified as recurrent: Secondary | ICD-10-CM | POA: Diagnosis not present

## 2018-06-04 DIAGNOSIS — R319 Hematuria, unspecified: Secondary | ICD-10-CM | POA: Diagnosis not present

## 2018-06-04 DIAGNOSIS — N400 Enlarged prostate without lower urinary tract symptoms: Secondary | ICD-10-CM | POA: Diagnosis not present

## 2018-06-06 DIAGNOSIS — R2681 Unsteadiness on feet: Secondary | ICD-10-CM | POA: Diagnosis not present

## 2018-06-06 DIAGNOSIS — G309 Alzheimer's disease, unspecified: Secondary | ICD-10-CM | POA: Diagnosis not present

## 2018-06-06 DIAGNOSIS — R2689 Other abnormalities of gait and mobility: Secondary | ICD-10-CM | POA: Diagnosis not present

## 2018-06-06 DIAGNOSIS — Z48816 Encounter for surgical aftercare following surgery on the genitourinary system: Secondary | ICD-10-CM | POA: Diagnosis not present

## 2018-06-06 DIAGNOSIS — M6281 Muscle weakness (generalized): Secondary | ICD-10-CM | POA: Diagnosis not present

## 2018-06-09 DIAGNOSIS — M6281 Muscle weakness (generalized): Secondary | ICD-10-CM | POA: Diagnosis not present

## 2018-06-09 DIAGNOSIS — Z7901 Long term (current) use of anticoagulants: Secondary | ICD-10-CM | POA: Diagnosis not present

## 2018-06-09 DIAGNOSIS — I442 Atrioventricular block, complete: Secondary | ICD-10-CM | POA: Diagnosis not present

## 2018-06-09 DIAGNOSIS — Z95810 Presence of automatic (implantable) cardiac defibrillator: Secondary | ICD-10-CM | POA: Diagnosis not present

## 2018-06-09 DIAGNOSIS — I482 Chronic atrial fibrillation: Secondary | ICD-10-CM | POA: Diagnosis not present

## 2018-06-09 DIAGNOSIS — I10 Essential (primary) hypertension: Secondary | ICD-10-CM | POA: Diagnosis not present

## 2018-06-09 DIAGNOSIS — Z951 Presence of aortocoronary bypass graft: Secondary | ICD-10-CM | POA: Diagnosis not present

## 2018-06-09 DIAGNOSIS — G309 Alzheimer's disease, unspecified: Secondary | ICD-10-CM | POA: Diagnosis not present

## 2018-06-09 DIAGNOSIS — I2510 Atherosclerotic heart disease of native coronary artery without angina pectoris: Secondary | ICD-10-CM | POA: Diagnosis not present

## 2018-06-10 DIAGNOSIS — G309 Alzheimer's disease, unspecified: Secondary | ICD-10-CM | POA: Diagnosis not present

## 2018-06-10 DIAGNOSIS — Z7901 Long term (current) use of anticoagulants: Secondary | ICD-10-CM | POA: Diagnosis not present

## 2018-06-10 DIAGNOSIS — I442 Atrioventricular block, complete: Secondary | ICD-10-CM | POA: Diagnosis not present

## 2018-06-10 DIAGNOSIS — Z95810 Presence of automatic (implantable) cardiac defibrillator: Secondary | ICD-10-CM | POA: Diagnosis not present

## 2018-06-10 DIAGNOSIS — M6281 Muscle weakness (generalized): Secondary | ICD-10-CM | POA: Diagnosis not present

## 2018-06-10 DIAGNOSIS — I482 Chronic atrial fibrillation: Secondary | ICD-10-CM | POA: Diagnosis not present

## 2018-06-10 DIAGNOSIS — Z951 Presence of aortocoronary bypass graft: Secondary | ICD-10-CM | POA: Diagnosis not present

## 2018-06-10 DIAGNOSIS — I10 Essential (primary) hypertension: Secondary | ICD-10-CM | POA: Diagnosis not present

## 2018-06-10 DIAGNOSIS — I2510 Atherosclerotic heart disease of native coronary artery without angina pectoris: Secondary | ICD-10-CM | POA: Diagnosis not present

## 2018-06-12 LAB — CUP PACEART INCLINIC DEVICE CHECK
Date Time Interrogation Session: 20190705135805
Implantable Lead Implant Date: 20101019
Implantable Lead Location: 753859
Implantable Lead Location: 753860
Implantable Lead Model: 1999
Implantable Lead Model: 7121
Implantable Pulse Generator Implant Date: 20150915
Lead Channel Setting Pacing Amplitude: 2.375
Lead Channel Setting Pacing Amplitude: 2.5 V
Lead Channel Setting Pacing Pulse Width: 0.5 ms
Lead Channel Setting Sensing Sensitivity: 2 mV
MDC IDC LEAD IMPLANT DT: 20101019
MDC IDC LEAD IMPLANT DT: 20101019
MDC IDC LEAD LOCATION: 753858
MDC IDC PG SERIAL: 7207729
MDC IDC SET LEADCHNL LV PACING PULSEWIDTH: 1.2 ms

## 2018-06-16 ENCOUNTER — Ambulatory Visit (INDEPENDENT_AMBULATORY_CARE_PROVIDER_SITE_OTHER): Payer: Medicare Other | Admitting: *Deleted

## 2018-06-16 DIAGNOSIS — Z7901 Long term (current) use of anticoagulants: Secondary | ICD-10-CM | POA: Diagnosis not present

## 2018-06-16 DIAGNOSIS — G309 Alzheimer's disease, unspecified: Secondary | ICD-10-CM | POA: Diagnosis not present

## 2018-06-16 DIAGNOSIS — I442 Atrioventricular block, complete: Secondary | ICD-10-CM

## 2018-06-16 DIAGNOSIS — M6281 Muscle weakness (generalized): Secondary | ICD-10-CM | POA: Diagnosis not present

## 2018-06-16 DIAGNOSIS — Z951 Presence of aortocoronary bypass graft: Secondary | ICD-10-CM | POA: Diagnosis not present

## 2018-06-16 DIAGNOSIS — I5032 Chronic diastolic (congestive) heart failure: Secondary | ICD-10-CM

## 2018-06-16 DIAGNOSIS — I10 Essential (primary) hypertension: Secondary | ICD-10-CM | POA: Diagnosis not present

## 2018-06-16 DIAGNOSIS — Z95810 Presence of automatic (implantable) cardiac defibrillator: Secondary | ICD-10-CM | POA: Diagnosis not present

## 2018-06-16 DIAGNOSIS — I2510 Atherosclerotic heart disease of native coronary artery without angina pectoris: Secondary | ICD-10-CM | POA: Diagnosis not present

## 2018-06-16 DIAGNOSIS — I482 Chronic atrial fibrillation: Secondary | ICD-10-CM | POA: Diagnosis not present

## 2018-06-16 LAB — CUP PACEART REMOTE DEVICE CHECK
Battery Remaining Longevity: 11 mo
Battery Remaining Percentage: 22 %
HIGH POWER IMPEDANCE MEASURED VALUE: 39 Ohm
HighPow Impedance: 39 Ohm
Implantable Lead Implant Date: 20101019
Implantable Lead Implant Date: 20101019
Implantable Lead Location: 753860
Implantable Lead Model: 1999
Implantable Lead Model: 7121
Lead Channel Impedance Value: 400 Ohm
Lead Channel Pacing Threshold Amplitude: 2.75 V
Lead Channel Pacing Threshold Pulse Width: 0.5 ms
Lead Channel Sensing Intrinsic Amplitude: 8.5 mV
Lead Channel Setting Pacing Amplitude: 3.25 V
MDC IDC LEAD IMPLANT DT: 20101019
MDC IDC LEAD LOCATION: 753858
MDC IDC LEAD LOCATION: 753859
MDC IDC MSMT BATTERY VOLTAGE: 2.78 V
MDC IDC MSMT LEADCHNL LV PACING THRESHOLD PULSEWIDTH: 1 ms
MDC IDC MSMT LEADCHNL RV IMPEDANCE VALUE: 360 Ohm
MDC IDC MSMT LEADCHNL RV PACING THRESHOLD AMPLITUDE: 0.75 V
MDC IDC PG IMPLANT DT: 20150915
MDC IDC PG SERIAL: 7207729
MDC IDC SESS DTM: 20190709175050
MDC IDC SET LEADCHNL LV PACING PULSEWIDTH: 1 ms
MDC IDC SET LEADCHNL RV PACING AMPLITUDE: 2.5 V
MDC IDC SET LEADCHNL RV PACING PULSEWIDTH: 0.5 ms
MDC IDC SET LEADCHNL RV SENSING SENSITIVITY: 0.5 mV

## 2018-06-17 NOTE — Progress Notes (Signed)
Remote ICD transmission.   

## 2018-06-18 DIAGNOSIS — I959 Hypotension, unspecified: Secondary | ICD-10-CM | POA: Diagnosis not present

## 2018-06-18 DIAGNOSIS — R319 Hematuria, unspecified: Secondary | ICD-10-CM | POA: Diagnosis not present

## 2018-06-18 DIAGNOSIS — I442 Atrioventricular block, complete: Secondary | ICD-10-CM | POA: Diagnosis not present

## 2018-06-18 DIAGNOSIS — Z95810 Presence of automatic (implantable) cardiac defibrillator: Secondary | ICD-10-CM | POA: Diagnosis not present

## 2018-06-18 DIAGNOSIS — G309 Alzheimer's disease, unspecified: Secondary | ICD-10-CM | POA: Diagnosis not present

## 2018-06-18 DIAGNOSIS — I2510 Atherosclerotic heart disease of native coronary artery without angina pectoris: Secondary | ICD-10-CM | POA: Diagnosis not present

## 2018-06-18 DIAGNOSIS — Z951 Presence of aortocoronary bypass graft: Secondary | ICD-10-CM | POA: Diagnosis not present

## 2018-06-18 DIAGNOSIS — Z7901 Long term (current) use of anticoagulants: Secondary | ICD-10-CM | POA: Diagnosis not present

## 2018-06-18 DIAGNOSIS — I10 Essential (primary) hypertension: Secondary | ICD-10-CM | POA: Diagnosis not present

## 2018-06-18 DIAGNOSIS — M6281 Muscle weakness (generalized): Secondary | ICD-10-CM | POA: Diagnosis not present

## 2018-06-18 DIAGNOSIS — I482 Chronic atrial fibrillation: Secondary | ICD-10-CM | POA: Diagnosis not present

## 2018-06-22 DIAGNOSIS — G300 Alzheimer's disease with early onset: Secondary | ICD-10-CM | POA: Diagnosis not present

## 2018-06-23 DIAGNOSIS — Z951 Presence of aortocoronary bypass graft: Secondary | ICD-10-CM | POA: Diagnosis not present

## 2018-06-23 DIAGNOSIS — I2510 Atherosclerotic heart disease of native coronary artery without angina pectoris: Secondary | ICD-10-CM | POA: Diagnosis not present

## 2018-06-23 DIAGNOSIS — I10 Essential (primary) hypertension: Secondary | ICD-10-CM | POA: Diagnosis not present

## 2018-06-23 DIAGNOSIS — N390 Urinary tract infection, site not specified: Secondary | ICD-10-CM | POA: Diagnosis not present

## 2018-06-23 DIAGNOSIS — I442 Atrioventricular block, complete: Secondary | ICD-10-CM | POA: Diagnosis not present

## 2018-06-23 DIAGNOSIS — G309 Alzheimer's disease, unspecified: Secondary | ICD-10-CM | POA: Diagnosis not present

## 2018-06-23 DIAGNOSIS — I482 Chronic atrial fibrillation: Secondary | ICD-10-CM | POA: Diagnosis not present

## 2018-06-23 DIAGNOSIS — Z95810 Presence of automatic (implantable) cardiac defibrillator: Secondary | ICD-10-CM | POA: Diagnosis not present

## 2018-06-23 DIAGNOSIS — Z7901 Long term (current) use of anticoagulants: Secondary | ICD-10-CM | POA: Diagnosis not present

## 2018-06-23 DIAGNOSIS — M6281 Muscle weakness (generalized): Secondary | ICD-10-CM | POA: Diagnosis not present

## 2018-06-25 DIAGNOSIS — G309 Alzheimer's disease, unspecified: Secondary | ICD-10-CM | POA: Diagnosis not present

## 2018-06-25 DIAGNOSIS — I482 Chronic atrial fibrillation: Secondary | ICD-10-CM | POA: Diagnosis not present

## 2018-06-25 DIAGNOSIS — R319 Hematuria, unspecified: Secondary | ICD-10-CM | POA: Diagnosis not present

## 2018-06-25 DIAGNOSIS — I442 Atrioventricular block, complete: Secondary | ICD-10-CM | POA: Diagnosis not present

## 2018-06-25 DIAGNOSIS — I10 Essential (primary) hypertension: Secondary | ICD-10-CM | POA: Diagnosis not present

## 2018-06-25 DIAGNOSIS — Z95810 Presence of automatic (implantable) cardiac defibrillator: Secondary | ICD-10-CM | POA: Diagnosis not present

## 2018-06-25 DIAGNOSIS — Z7901 Long term (current) use of anticoagulants: Secondary | ICD-10-CM | POA: Diagnosis not present

## 2018-06-25 DIAGNOSIS — Z951 Presence of aortocoronary bypass graft: Secondary | ICD-10-CM | POA: Diagnosis not present

## 2018-06-25 DIAGNOSIS — N400 Enlarged prostate without lower urinary tract symptoms: Secondary | ICD-10-CM | POA: Diagnosis not present

## 2018-06-25 DIAGNOSIS — M6281 Muscle weakness (generalized): Secondary | ICD-10-CM | POA: Diagnosis not present

## 2018-06-25 DIAGNOSIS — K4090 Unilateral inguinal hernia, without obstruction or gangrene, not specified as recurrent: Secondary | ICD-10-CM | POA: Diagnosis not present

## 2018-06-25 DIAGNOSIS — I2510 Atherosclerotic heart disease of native coronary artery without angina pectoris: Secondary | ICD-10-CM | POA: Diagnosis not present

## 2018-06-25 DIAGNOSIS — E559 Vitamin D deficiency, unspecified: Secondary | ICD-10-CM | POA: Diagnosis not present

## 2018-06-30 DIAGNOSIS — Z95810 Presence of automatic (implantable) cardiac defibrillator: Secondary | ICD-10-CM | POA: Diagnosis not present

## 2018-06-30 DIAGNOSIS — M6281 Muscle weakness (generalized): Secondary | ICD-10-CM | POA: Diagnosis not present

## 2018-06-30 DIAGNOSIS — G309 Alzheimer's disease, unspecified: Secondary | ICD-10-CM | POA: Diagnosis not present

## 2018-06-30 DIAGNOSIS — Z7901 Long term (current) use of anticoagulants: Secondary | ICD-10-CM | POA: Diagnosis not present

## 2018-06-30 DIAGNOSIS — I2510 Atherosclerotic heart disease of native coronary artery without angina pectoris: Secondary | ICD-10-CM | POA: Diagnosis not present

## 2018-06-30 DIAGNOSIS — I482 Chronic atrial fibrillation: Secondary | ICD-10-CM | POA: Diagnosis not present

## 2018-06-30 DIAGNOSIS — I442 Atrioventricular block, complete: Secondary | ICD-10-CM | POA: Diagnosis not present

## 2018-06-30 DIAGNOSIS — I10 Essential (primary) hypertension: Secondary | ICD-10-CM | POA: Diagnosis not present

## 2018-06-30 DIAGNOSIS — Z951 Presence of aortocoronary bypass graft: Secondary | ICD-10-CM | POA: Diagnosis not present

## 2018-07-01 DIAGNOSIS — I482 Chronic atrial fibrillation: Secondary | ICD-10-CM | POA: Diagnosis not present

## 2018-07-01 DIAGNOSIS — Z7901 Long term (current) use of anticoagulants: Secondary | ICD-10-CM | POA: Diagnosis not present

## 2018-07-01 DIAGNOSIS — Z951 Presence of aortocoronary bypass graft: Secondary | ICD-10-CM | POA: Diagnosis not present

## 2018-07-01 DIAGNOSIS — I442 Atrioventricular block, complete: Secondary | ICD-10-CM | POA: Diagnosis not present

## 2018-07-01 DIAGNOSIS — G309 Alzheimer's disease, unspecified: Secondary | ICD-10-CM | POA: Diagnosis not present

## 2018-07-01 DIAGNOSIS — Z95810 Presence of automatic (implantable) cardiac defibrillator: Secondary | ICD-10-CM | POA: Diagnosis not present

## 2018-07-01 DIAGNOSIS — M6281 Muscle weakness (generalized): Secondary | ICD-10-CM | POA: Diagnosis not present

## 2018-07-01 DIAGNOSIS — I10 Essential (primary) hypertension: Secondary | ICD-10-CM | POA: Diagnosis not present

## 2018-07-01 DIAGNOSIS — I2510 Atherosclerotic heart disease of native coronary artery without angina pectoris: Secondary | ICD-10-CM | POA: Diagnosis not present

## 2018-07-03 DIAGNOSIS — I442 Atrioventricular block, complete: Secondary | ICD-10-CM | POA: Diagnosis not present

## 2018-07-03 DIAGNOSIS — G309 Alzheimer's disease, unspecified: Secondary | ICD-10-CM | POA: Diagnosis not present

## 2018-07-03 DIAGNOSIS — I482 Chronic atrial fibrillation: Secondary | ICD-10-CM | POA: Diagnosis not present

## 2018-07-03 DIAGNOSIS — M6281 Muscle weakness (generalized): Secondary | ICD-10-CM | POA: Diagnosis not present

## 2018-07-03 DIAGNOSIS — G300 Alzheimer's disease with early onset: Secondary | ICD-10-CM | POA: Diagnosis not present

## 2018-07-03 DIAGNOSIS — I10 Essential (primary) hypertension: Secondary | ICD-10-CM | POA: Diagnosis not present

## 2018-07-03 DIAGNOSIS — Z7901 Long term (current) use of anticoagulants: Secondary | ICD-10-CM | POA: Diagnosis not present

## 2018-07-03 DIAGNOSIS — Z951 Presence of aortocoronary bypass graft: Secondary | ICD-10-CM | POA: Diagnosis not present

## 2018-07-03 DIAGNOSIS — Z95810 Presence of automatic (implantable) cardiac defibrillator: Secondary | ICD-10-CM | POA: Diagnosis not present

## 2018-07-03 DIAGNOSIS — E785 Hyperlipidemia, unspecified: Secondary | ICD-10-CM | POA: Diagnosis not present

## 2018-07-03 DIAGNOSIS — E559 Vitamin D deficiency, unspecified: Secondary | ICD-10-CM | POA: Diagnosis not present

## 2018-07-03 DIAGNOSIS — I2510 Atherosclerotic heart disease of native coronary artery without angina pectoris: Secondary | ICD-10-CM | POA: Diagnosis not present

## 2018-07-06 DIAGNOSIS — G300 Alzheimer's disease with early onset: Secondary | ICD-10-CM | POA: Diagnosis not present

## 2018-07-07 DIAGNOSIS — L603 Nail dystrophy: Secondary | ICD-10-CM | POA: Diagnosis not present

## 2018-07-07 DIAGNOSIS — Z95810 Presence of automatic (implantable) cardiac defibrillator: Secondary | ICD-10-CM | POA: Diagnosis not present

## 2018-07-07 DIAGNOSIS — B351 Tinea unguium: Secondary | ICD-10-CM | POA: Diagnosis not present

## 2018-07-07 DIAGNOSIS — I10 Essential (primary) hypertension: Secondary | ICD-10-CM | POA: Diagnosis not present

## 2018-07-07 DIAGNOSIS — I482 Chronic atrial fibrillation: Secondary | ICD-10-CM | POA: Diagnosis not present

## 2018-07-07 DIAGNOSIS — M6281 Muscle weakness (generalized): Secondary | ICD-10-CM | POA: Diagnosis not present

## 2018-07-07 DIAGNOSIS — Z951 Presence of aortocoronary bypass graft: Secondary | ICD-10-CM | POA: Diagnosis not present

## 2018-07-07 DIAGNOSIS — Z7901 Long term (current) use of anticoagulants: Secondary | ICD-10-CM | POA: Diagnosis not present

## 2018-07-07 DIAGNOSIS — G309 Alzheimer's disease, unspecified: Secondary | ICD-10-CM | POA: Diagnosis not present

## 2018-07-07 DIAGNOSIS — I442 Atrioventricular block, complete: Secondary | ICD-10-CM | POA: Diagnosis not present

## 2018-07-07 DIAGNOSIS — I739 Peripheral vascular disease, unspecified: Secondary | ICD-10-CM | POA: Diagnosis not present

## 2018-07-07 DIAGNOSIS — I2510 Atherosclerotic heart disease of native coronary artery without angina pectoris: Secondary | ICD-10-CM | POA: Diagnosis not present

## 2018-07-08 ENCOUNTER — Ambulatory Visit: Payer: Self-pay | Admitting: Pharmacist

## 2018-07-08 DIAGNOSIS — I4821 Permanent atrial fibrillation: Secondary | ICD-10-CM

## 2018-07-09 DIAGNOSIS — I482 Chronic atrial fibrillation: Secondary | ICD-10-CM | POA: Diagnosis not present

## 2018-07-09 DIAGNOSIS — Z95810 Presence of automatic (implantable) cardiac defibrillator: Secondary | ICD-10-CM | POA: Diagnosis not present

## 2018-07-09 DIAGNOSIS — Z7901 Long term (current) use of anticoagulants: Secondary | ICD-10-CM | POA: Diagnosis not present

## 2018-07-09 DIAGNOSIS — M6281 Muscle weakness (generalized): Secondary | ICD-10-CM | POA: Diagnosis not present

## 2018-07-09 DIAGNOSIS — I2510 Atherosclerotic heart disease of native coronary artery without angina pectoris: Secondary | ICD-10-CM | POA: Diagnosis not present

## 2018-07-09 DIAGNOSIS — Z951 Presence of aortocoronary bypass graft: Secondary | ICD-10-CM | POA: Diagnosis not present

## 2018-07-09 DIAGNOSIS — G309 Alzheimer's disease, unspecified: Secondary | ICD-10-CM | POA: Diagnosis not present

## 2018-07-09 DIAGNOSIS — I10 Essential (primary) hypertension: Secondary | ICD-10-CM | POA: Diagnosis not present

## 2018-07-09 DIAGNOSIS — I442 Atrioventricular block, complete: Secondary | ICD-10-CM | POA: Diagnosis not present

## 2018-07-10 DIAGNOSIS — I2510 Atherosclerotic heart disease of native coronary artery without angina pectoris: Secondary | ICD-10-CM | POA: Diagnosis not present

## 2018-07-10 DIAGNOSIS — Z951 Presence of aortocoronary bypass graft: Secondary | ICD-10-CM | POA: Diagnosis not present

## 2018-07-10 DIAGNOSIS — I10 Essential (primary) hypertension: Secondary | ICD-10-CM | POA: Diagnosis not present

## 2018-07-10 DIAGNOSIS — M6281 Muscle weakness (generalized): Secondary | ICD-10-CM | POA: Diagnosis not present

## 2018-07-10 DIAGNOSIS — I442 Atrioventricular block, complete: Secondary | ICD-10-CM | POA: Diagnosis not present

## 2018-07-10 DIAGNOSIS — G309 Alzheimer's disease, unspecified: Secondary | ICD-10-CM | POA: Diagnosis not present

## 2018-07-10 DIAGNOSIS — Z95810 Presence of automatic (implantable) cardiac defibrillator: Secondary | ICD-10-CM | POA: Diagnosis not present

## 2018-07-10 DIAGNOSIS — Z7901 Long term (current) use of anticoagulants: Secondary | ICD-10-CM | POA: Diagnosis not present

## 2018-07-10 DIAGNOSIS — I482 Chronic atrial fibrillation: Secondary | ICD-10-CM | POA: Diagnosis not present

## 2018-07-14 DIAGNOSIS — M6281 Muscle weakness (generalized): Secondary | ICD-10-CM | POA: Diagnosis not present

## 2018-07-14 DIAGNOSIS — Z95810 Presence of automatic (implantable) cardiac defibrillator: Secondary | ICD-10-CM | POA: Diagnosis not present

## 2018-07-14 DIAGNOSIS — I2510 Atherosclerotic heart disease of native coronary artery without angina pectoris: Secondary | ICD-10-CM | POA: Diagnosis not present

## 2018-07-14 DIAGNOSIS — Z7901 Long term (current) use of anticoagulants: Secondary | ICD-10-CM | POA: Diagnosis not present

## 2018-07-14 DIAGNOSIS — I482 Chronic atrial fibrillation: Secondary | ICD-10-CM | POA: Diagnosis not present

## 2018-07-14 DIAGNOSIS — Z951 Presence of aortocoronary bypass graft: Secondary | ICD-10-CM | POA: Diagnosis not present

## 2018-07-14 DIAGNOSIS — I10 Essential (primary) hypertension: Secondary | ICD-10-CM | POA: Diagnosis not present

## 2018-07-14 DIAGNOSIS — I442 Atrioventricular block, complete: Secondary | ICD-10-CM | POA: Diagnosis not present

## 2018-07-14 DIAGNOSIS — G309 Alzheimer's disease, unspecified: Secondary | ICD-10-CM | POA: Diagnosis not present

## 2018-07-17 DIAGNOSIS — Z95810 Presence of automatic (implantable) cardiac defibrillator: Secondary | ICD-10-CM | POA: Diagnosis not present

## 2018-07-17 DIAGNOSIS — M6281 Muscle weakness (generalized): Secondary | ICD-10-CM | POA: Diagnosis not present

## 2018-07-17 DIAGNOSIS — Z951 Presence of aortocoronary bypass graft: Secondary | ICD-10-CM | POA: Diagnosis not present

## 2018-07-17 DIAGNOSIS — Z7901 Long term (current) use of anticoagulants: Secondary | ICD-10-CM | POA: Diagnosis not present

## 2018-07-17 DIAGNOSIS — I10 Essential (primary) hypertension: Secondary | ICD-10-CM | POA: Diagnosis not present

## 2018-07-17 DIAGNOSIS — I482 Chronic atrial fibrillation: Secondary | ICD-10-CM | POA: Diagnosis not present

## 2018-07-17 DIAGNOSIS — I2510 Atherosclerotic heart disease of native coronary artery without angina pectoris: Secondary | ICD-10-CM | POA: Diagnosis not present

## 2018-07-17 DIAGNOSIS — I442 Atrioventricular block, complete: Secondary | ICD-10-CM | POA: Diagnosis not present

## 2018-07-17 DIAGNOSIS — G309 Alzheimer's disease, unspecified: Secondary | ICD-10-CM | POA: Diagnosis not present

## 2018-07-20 DIAGNOSIS — G309 Alzheimer's disease, unspecified: Secondary | ICD-10-CM | POA: Diagnosis not present

## 2018-07-20 DIAGNOSIS — Z7901 Long term (current) use of anticoagulants: Secondary | ICD-10-CM | POA: Diagnosis not present

## 2018-07-20 DIAGNOSIS — I482 Chronic atrial fibrillation: Secondary | ICD-10-CM | POA: Diagnosis not present

## 2018-07-20 DIAGNOSIS — I2510 Atherosclerotic heart disease of native coronary artery without angina pectoris: Secondary | ICD-10-CM | POA: Diagnosis not present

## 2018-07-20 DIAGNOSIS — Z95810 Presence of automatic (implantable) cardiac defibrillator: Secondary | ICD-10-CM | POA: Diagnosis not present

## 2018-07-20 DIAGNOSIS — I442 Atrioventricular block, complete: Secondary | ICD-10-CM | POA: Diagnosis not present

## 2018-07-20 DIAGNOSIS — Z951 Presence of aortocoronary bypass graft: Secondary | ICD-10-CM | POA: Diagnosis not present

## 2018-07-20 DIAGNOSIS — I10 Essential (primary) hypertension: Secondary | ICD-10-CM | POA: Diagnosis not present

## 2018-07-20 DIAGNOSIS — M6281 Muscle weakness (generalized): Secondary | ICD-10-CM | POA: Diagnosis not present

## 2018-07-21 DIAGNOSIS — I442 Atrioventricular block, complete: Secondary | ICD-10-CM | POA: Diagnosis not present

## 2018-07-21 DIAGNOSIS — Z95810 Presence of automatic (implantable) cardiac defibrillator: Secondary | ICD-10-CM | POA: Diagnosis not present

## 2018-07-21 DIAGNOSIS — I10 Essential (primary) hypertension: Secondary | ICD-10-CM | POA: Diagnosis not present

## 2018-07-21 DIAGNOSIS — M6281 Muscle weakness (generalized): Secondary | ICD-10-CM | POA: Diagnosis not present

## 2018-07-21 DIAGNOSIS — H18413 Arcus senilis, bilateral: Secondary | ICD-10-CM | POA: Diagnosis not present

## 2018-07-21 DIAGNOSIS — H02839 Dermatochalasis of unspecified eye, unspecified eyelid: Secondary | ICD-10-CM | POA: Diagnosis not present

## 2018-07-21 DIAGNOSIS — G309 Alzheimer's disease, unspecified: Secondary | ICD-10-CM | POA: Diagnosis not present

## 2018-07-21 DIAGNOSIS — Z951 Presence of aortocoronary bypass graft: Secondary | ICD-10-CM | POA: Diagnosis not present

## 2018-07-21 DIAGNOSIS — I2510 Atherosclerotic heart disease of native coronary artery without angina pectoris: Secondary | ICD-10-CM | POA: Diagnosis not present

## 2018-07-21 DIAGNOSIS — H401232 Low-tension glaucoma, bilateral, moderate stage: Secondary | ICD-10-CM | POA: Diagnosis not present

## 2018-07-21 DIAGNOSIS — H2513 Age-related nuclear cataract, bilateral: Secondary | ICD-10-CM | POA: Diagnosis not present

## 2018-07-21 DIAGNOSIS — Z7901 Long term (current) use of anticoagulants: Secondary | ICD-10-CM | POA: Diagnosis not present

## 2018-07-21 DIAGNOSIS — I482 Chronic atrial fibrillation: Secondary | ICD-10-CM | POA: Diagnosis not present

## 2018-07-27 DIAGNOSIS — R3912 Poor urinary stream: Secondary | ICD-10-CM | POA: Diagnosis not present

## 2018-07-27 DIAGNOSIS — N401 Enlarged prostate with lower urinary tract symptoms: Secondary | ICD-10-CM | POA: Diagnosis not present

## 2018-07-27 DIAGNOSIS — R972 Elevated prostate specific antigen [PSA]: Secondary | ICD-10-CM | POA: Diagnosis not present

## 2018-08-03 DIAGNOSIS — G300 Alzheimer's disease with early onset: Secondary | ICD-10-CM | POA: Diagnosis not present

## 2018-08-05 DIAGNOSIS — E559 Vitamin D deficiency, unspecified: Secondary | ICD-10-CM | POA: Diagnosis not present

## 2018-08-05 DIAGNOSIS — R319 Hematuria, unspecified: Secondary | ICD-10-CM | POA: Diagnosis not present

## 2018-08-05 DIAGNOSIS — K4090 Unilateral inguinal hernia, without obstruction or gangrene, not specified as recurrent: Secondary | ICD-10-CM | POA: Diagnosis not present

## 2018-08-05 DIAGNOSIS — M6281 Muscle weakness (generalized): Secondary | ICD-10-CM | POA: Diagnosis not present

## 2018-08-06 DIAGNOSIS — G300 Alzheimer's disease with early onset: Secondary | ICD-10-CM | POA: Diagnosis not present

## 2018-08-06 DIAGNOSIS — K4090 Unilateral inguinal hernia, without obstruction or gangrene, not specified as recurrent: Secondary | ICD-10-CM | POA: Diagnosis not present

## 2018-08-06 DIAGNOSIS — E559 Vitamin D deficiency, unspecified: Secondary | ICD-10-CM | POA: Diagnosis not present

## 2018-08-06 DIAGNOSIS — R319 Hematuria, unspecified: Secondary | ICD-10-CM | POA: Diagnosis not present

## 2018-08-06 DIAGNOSIS — E785 Hyperlipidemia, unspecified: Secondary | ICD-10-CM | POA: Diagnosis not present

## 2018-08-06 DIAGNOSIS — M6281 Muscle weakness (generalized): Secondary | ICD-10-CM | POA: Diagnosis not present

## 2018-08-07 DIAGNOSIS — G309 Alzheimer's disease, unspecified: Secondary | ICD-10-CM | POA: Diagnosis not present

## 2018-08-07 DIAGNOSIS — Z951 Presence of aortocoronary bypass graft: Secondary | ICD-10-CM | POA: Diagnosis not present

## 2018-08-07 DIAGNOSIS — I2510 Atherosclerotic heart disease of native coronary artery without angina pectoris: Secondary | ICD-10-CM | POA: Diagnosis not present

## 2018-08-07 DIAGNOSIS — I10 Essential (primary) hypertension: Secondary | ICD-10-CM | POA: Diagnosis not present

## 2018-08-07 DIAGNOSIS — Z7901 Long term (current) use of anticoagulants: Secondary | ICD-10-CM | POA: Diagnosis not present

## 2018-08-07 DIAGNOSIS — I4891 Unspecified atrial fibrillation: Secondary | ICD-10-CM | POA: Diagnosis not present

## 2018-08-07 DIAGNOSIS — Z95810 Presence of automatic (implantable) cardiac defibrillator: Secondary | ICD-10-CM | POA: Diagnosis not present

## 2018-08-07 DIAGNOSIS — R1312 Dysphagia, oropharyngeal phase: Secondary | ICD-10-CM | POA: Diagnosis not present

## 2018-08-28 ENCOUNTER — Emergency Department (HOSPITAL_BASED_OUTPATIENT_CLINIC_OR_DEPARTMENT_OTHER): Payer: Medicare Other

## 2018-08-28 ENCOUNTER — Other Ambulatory Visit: Payer: Self-pay

## 2018-08-28 ENCOUNTER — Encounter (HOSPITAL_BASED_OUTPATIENT_CLINIC_OR_DEPARTMENT_OTHER): Payer: Self-pay

## 2018-08-28 ENCOUNTER — Observation Stay (HOSPITAL_BASED_OUTPATIENT_CLINIC_OR_DEPARTMENT_OTHER)
Admission: EM | Admit: 2018-08-28 | Discharge: 2018-08-29 | Disposition: A | Discharge status: Nursing Home (Includes ICF) | Payer: Medicare Other | Attending: Family Medicine | Admitting: Family Medicine

## 2018-08-28 DIAGNOSIS — I110 Hypertensive heart disease with heart failure: Secondary | ICD-10-CM | POA: Insufficient documentation

## 2018-08-28 DIAGNOSIS — I340 Nonrheumatic mitral (valve) insufficiency: Secondary | ICD-10-CM | POA: Diagnosis present

## 2018-08-28 DIAGNOSIS — I2510 Atherosclerotic heart disease of native coronary artery without angina pectoris: Secondary | ICD-10-CM | POA: Diagnosis not present

## 2018-08-28 DIAGNOSIS — E782 Mixed hyperlipidemia: Secondary | ICD-10-CM | POA: Diagnosis not present

## 2018-08-28 DIAGNOSIS — I5032 Chronic diastolic (congestive) heart failure: Secondary | ICD-10-CM | POA: Diagnosis not present

## 2018-08-28 DIAGNOSIS — I442 Atrioventricular block, complete: Secondary | ICD-10-CM

## 2018-08-28 DIAGNOSIS — N401 Enlarged prostate with lower urinary tract symptoms: Secondary | ICD-10-CM

## 2018-08-28 DIAGNOSIS — Z79899 Other long term (current) drug therapy: Secondary | ICD-10-CM | POA: Insufficient documentation

## 2018-08-28 DIAGNOSIS — Z66 Do not resuscitate: Secondary | ICD-10-CM | POA: Insufficient documentation

## 2018-08-28 DIAGNOSIS — Z951 Presence of aortocoronary bypass graft: Secondary | ICD-10-CM | POA: Diagnosis not present

## 2018-08-28 DIAGNOSIS — F0390 Unspecified dementia without behavioral disturbance: Secondary | ICD-10-CM | POA: Diagnosis not present

## 2018-08-28 DIAGNOSIS — E7800 Pure hypercholesterolemia, unspecified: Secondary | ICD-10-CM

## 2018-08-28 DIAGNOSIS — Z7901 Long term (current) use of anticoagulants: Secondary | ICD-10-CM | POA: Diagnosis not present

## 2018-08-28 DIAGNOSIS — Z95810 Presence of automatic (implantable) cardiac defibrillator: Secondary | ICD-10-CM | POA: Diagnosis not present

## 2018-08-28 DIAGNOSIS — I482 Chronic atrial fibrillation: Secondary | ICD-10-CM | POA: Diagnosis not present

## 2018-08-28 DIAGNOSIS — J690 Pneumonitis due to inhalation of food and vomit: Secondary | ICD-10-CM | POA: Diagnosis not present

## 2018-08-28 DIAGNOSIS — F0150 Vascular dementia without behavioral disturbance: Secondary | ICD-10-CM

## 2018-08-28 DIAGNOSIS — I10 Essential (primary) hypertension: Secondary | ICD-10-CM

## 2018-08-28 DIAGNOSIS — N400 Enlarged prostate without lower urinary tract symptoms: Secondary | ICD-10-CM | POA: Diagnosis not present

## 2018-08-28 DIAGNOSIS — Z9889 Other specified postprocedural states: Secondary | ICD-10-CM

## 2018-08-28 DIAGNOSIS — R1310 Dysphagia, unspecified: Secondary | ICD-10-CM | POA: Insufficient documentation

## 2018-08-28 HISTORY — DX: Presence of automatic (implantable) cardiac defibrillator: Z95.810

## 2018-08-28 HISTORY — DX: Unspecified atrial fibrillation: I48.91

## 2018-08-28 HISTORY — DX: Angina pectoris, unspecified: I20.9

## 2018-08-28 HISTORY — DX: Asymptomatic microscopic hematuria: R31.21

## 2018-08-28 HISTORY — DX: Long term (current) use of anticoagulants: Z79.01

## 2018-08-28 HISTORY — DX: Presence of cardiac pacemaker: Z95.0

## 2018-08-28 LAB — CBC WITH DIFFERENTIAL/PLATELET
Basophils Absolute: 0 10*3/uL (ref 0.0–0.1)
Basophils Relative: 0 %
EOS PCT: 0 %
Eosinophils Absolute: 0 10*3/uL (ref 0.0–0.7)
HEMATOCRIT: 39.4 % (ref 39.0–52.0)
Hemoglobin: 13.4 g/dL (ref 13.0–17.0)
LYMPHS ABS: 0.7 10*3/uL (ref 0.7–4.0)
LYMPHS PCT: 7 %
MCH: 33.9 pg (ref 26.0–34.0)
MCHC: 34 g/dL (ref 30.0–36.0)
MCV: 99.7 fL (ref 78.0–100.0)
Monocytes Absolute: 0.7 10*3/uL (ref 0.1–1.0)
Monocytes Relative: 6 %
NEUTROS ABS: 9.4 10*3/uL — AB (ref 1.7–7.7)
Neutrophils Relative %: 87 %
PLATELETS: 102 10*3/uL — AB (ref 150–400)
RBC: 3.95 MIL/uL — AB (ref 4.22–5.81)
RDW: 12.5 % (ref 11.5–15.5)
WBC: 10.8 10*3/uL — AB (ref 4.0–10.5)

## 2018-08-28 LAB — COMPREHENSIVE METABOLIC PANEL
ALK PHOS: 72 U/L (ref 38–126)
ALT: 12 U/L (ref 0–44)
AST: 22 U/L (ref 15–41)
Albumin: 3.8 g/dL (ref 3.5–5.0)
Anion gap: 8 (ref 5–15)
BILIRUBIN TOTAL: 1.9 mg/dL — AB (ref 0.3–1.2)
BUN: 17 mg/dL (ref 8–23)
CALCIUM: 9.5 mg/dL (ref 8.9–10.3)
CO2: 28 mmol/L (ref 22–32)
CREATININE: 0.71 mg/dL (ref 0.61–1.24)
Chloride: 103 mmol/L (ref 98–111)
Glucose, Bld: 116 mg/dL — ABNORMAL HIGH (ref 70–99)
Potassium: 4 mmol/L (ref 3.5–5.1)
Sodium: 139 mmol/L (ref 135–145)
TOTAL PROTEIN: 6.7 g/dL (ref 6.5–8.1)

## 2018-08-28 LAB — URINALYSIS, MICROSCOPIC (REFLEX)

## 2018-08-28 LAB — URINALYSIS, ROUTINE W REFLEX MICROSCOPIC
BILIRUBIN URINE: NEGATIVE
Glucose, UA: NEGATIVE mg/dL
KETONES UR: NEGATIVE mg/dL
Leukocytes, UA: NEGATIVE
NITRITE: NEGATIVE
Protein, ur: NEGATIVE mg/dL
Specific Gravity, Urine: 1.025 (ref 1.005–1.030)
pH: 5.5 (ref 5.0–8.0)

## 2018-08-28 LAB — MRSA PCR SCREENING: MRSA BY PCR: NEGATIVE

## 2018-08-28 LAB — I-STAT CG4 LACTIC ACID, ED: Lactic Acid, Venous: 1.61 mmol/L (ref 0.5–1.9)

## 2018-08-28 MED ORDER — BRIMONIDINE TARTRATE 0.2 % OP SOLN
1.0000 [drp] | Freq: Three times a day (TID) | OPHTHALMIC | Status: DC
  Administered 2018-08-28 – 2018-08-29 (×3): 1 [drp] via OPHTHALMIC
  Filled 2018-08-28: qty 5

## 2018-08-28 MED ORDER — ACETAMINOPHEN 325 MG RE SUPP
325.0000 mg | Freq: Once | RECTAL | Status: AC
  Administered 2018-08-28: 325 mg via RECTAL

## 2018-08-28 MED ORDER — DOCUSATE SODIUM 100 MG PO CAPS
100.0000 mg | ORAL_CAPSULE | Freq: Two times a day (BID) | ORAL | Status: DC
  Administered 2018-08-28 – 2018-08-29 (×2): 100 mg via ORAL
  Filled 2018-08-28 (×2): qty 1

## 2018-08-28 MED ORDER — ACETAMINOPHEN 325 MG PO TABS
650.0000 mg | ORAL_TABLET | Freq: Once | ORAL | Status: AC
  Administered 2018-08-28: 650 mg via ORAL
  Filled 2018-08-28: qty 2

## 2018-08-28 MED ORDER — QUETIAPINE FUMARATE 50 MG PO TABS: 25.0000 mg | ORAL_TABLET | Freq: Every evening | ORAL | Status: DC | PRN

## 2018-08-28 MED ORDER — FINASTERIDE 5 MG PO TABS
5.0000 mg | ORAL_TABLET | Freq: Every day | ORAL | Status: DC
  Administered 2018-08-29: 5 mg via ORAL
  Filled 2018-08-28 (×2): qty 1

## 2018-08-28 MED ORDER — SODIUM CHLORIDE 0.9 % IV SOLN
3.0000 g | Freq: Four times a day (QID) | INTRAVENOUS | Status: DC
  Administered 2018-08-28 – 2018-08-29 (×4): 3 g via INTRAVENOUS
  Filled 2018-08-28 (×6): qty 3

## 2018-08-28 MED ORDER — APIXABAN 5 MG PO TABS
5.0000 mg | ORAL_TABLET | Freq: Two times a day (BID) | ORAL | Status: DC
  Administered 2018-08-28 – 2018-08-29 (×2): 5 mg via ORAL
  Filled 2018-08-28 (×2): qty 1

## 2018-08-28 MED ORDER — SODIUM CHLORIDE 0.9 % IV SOLN: 3.0000 g | Freq: Four times a day (QID) | INTRAVENOUS | Status: DC

## 2018-08-28 MED ORDER — DONEPEZIL HCL 10 MG PO TABS
10.0000 mg | ORAL_TABLET | Freq: Every day | ORAL | Status: DC
  Administered 2018-08-28: 10 mg via ORAL
  Filled 2018-08-28: qty 1

## 2018-08-28 MED ORDER — APIXABAN 2.5 MG PO TABS: 5.0000 mg | ORAL_TABLET | Freq: Two times a day (BID) | ORAL | Status: DC

## 2018-08-28 MED ORDER — EZETIMIBE 10 MG PO TABS
10.0000 mg | ORAL_TABLET | Freq: Every day | ORAL | Status: DC
  Administered 2018-08-29: 10 mg via ORAL
  Filled 2018-08-28 (×2): qty 1

## 2018-08-28 MED ORDER — LACTATED RINGERS IV BOLUS
1000.0000 mL | Freq: Once | INTRAVENOUS | Status: AC
  Administered 2018-08-28: 1000 mL via INTRAVENOUS

## 2018-08-28 MED ORDER — CARVEDILOL 3.125 MG PO TABS
3.1250 mg | ORAL_TABLET | Freq: Two times a day (BID) | ORAL | Status: DC
  Administered 2018-08-28 – 2018-08-29 (×2): 3.125 mg via ORAL
  Filled 2018-08-28 (×3): qty 1

## 2018-08-28 MED ORDER — TAMSULOSIN HCL 0.4 MG PO CAPS
0.4000 mg | ORAL_CAPSULE | Freq: Every day | ORAL | Status: DC
  Administered 2018-08-28: 0.4 mg via ORAL
  Filled 2018-08-28: qty 1

## 2018-08-28 MED ORDER — DEXTROSE-NACL 5-0.9 % IV SOLN
INTRAVENOUS | Status: DC
  Administered 2018-08-28 – 2018-08-29 (×2): via INTRAVENOUS

## 2018-08-28 MED ORDER — ENOXAPARIN SODIUM 40 MG/0.4ML ~~LOC~~ SOLN: 30.0000 mg | SUBCUTANEOUS | Status: DC

## 2018-08-28 MED ORDER — IOPAMIDOL (ISOVUE-300) INJECTION 61%
100.0000 mL | Freq: Once | INTRAVENOUS | Status: AC | PRN
  Administered 2018-08-28: 100 mL via INTRAVENOUS

## 2018-08-28 MED ORDER — MIRTAZAPINE 15 MG PO TABS
15.0000 mg | ORAL_TABLET | Freq: Every day | ORAL | Status: DC
  Administered 2018-08-28: 15 mg via ORAL
  Filled 2018-08-28 (×2): qty 1

## 2018-08-28 MED ORDER — SODIUM CHLORIDE 0.9 % IV SOLN
3.0000 g | Freq: Once | INTRAVENOUS | Status: AC
  Administered 2018-08-28: 3 g via INTRAVENOUS
  Filled 2018-08-28: qty 3

## 2018-08-28 MED ORDER — ACETAMINOPHEN 325 MG PO TABS
650.0000 mg | ORAL_TABLET | Freq: Four times a day (QID) | ORAL | Status: DC | PRN
  Administered 2018-08-29: 650 mg via ORAL
  Filled 2018-08-28: qty 2

## 2018-08-28 MED ORDER — ACETAMINOPHEN 325 MG RE SUPP
RECTAL | Status: AC
  Filled 2018-08-28: qty 1

## 2018-08-28 NOTE — ED Notes (Signed)
Pt. Daughter aware of room Pt. Going to and aware that [facility] is on the way.  Pt. Daughter [name] standing right infront of RN Rosana Hoes While RN giving report to [facility] and still asked that RN call her when [facility] arrived and leaves with the Pt.  RN explained to the Daughter that I can not call her that [facility] will be here in 15 mins or so and will have him to [facility] in another 30 mins or so after loading him on the ambulance.  Explained to the Pt. Daughter [name] that the process is timely and I have other Pts. To take care of as well and the phone calls are timely and I need to care for others the way I cared for her father.

## 2018-08-28 NOTE — ED Triage Notes (Signed)
Pt BIBEMS from [facility]. Staff reports worsening confusion over the past weed. EMS reports fever in truck. Pt is alert but pleasantly confused. Pt appears to have non-productive cough.

## 2018-08-28 NOTE — ED Notes (Signed)
ED Provider at bedside. 

## 2018-08-28 NOTE — H&P (Signed)
History and Physical   Grant Lawrence UXN:[id number] DOB: May 24, 1931 DOA: 08/28/2018  Referring MD/NP/PA: Dr. Ronnald Nian, EDP PCP: Marletta Lor, MD Outpatient Specialists: Cardiology, Dr. Sallyanne Kuster  Patient coming from: [facility] by way of [facility]  Chief Complaint: AMS  HPI: Grant Lawrence is an 84 y.o. male with a history of CAD s/p CABG and MVR 2005, AFib on eliquis s/p ICD, HFpEF, dementia, HTN, BPH, HLD, and recent inguinal hernia repair who presented to [facility] by EMS after an episode of weakness and confusion at breakfast at his assisted living facility. He had reported not feeling well starting the previous evening, but used the walker to get to breakfast this morning where he was found to be slumped over and weak. He may have had a fever though this is unclear and no medications were given prior to arrival. He's had a mild, intermittent, nonproductive cough as well. His daughter confirms the suspicion that he's having trouble swallowing based on intermittent choking with liquids and solids, though no work up has yet been done.  ED Course: On arrival to [facility] ED he was noted to be febrile but otherwise hemodynamically stable. CXR demonstrated well-defined inferior RUL infiltrate and WBC was elevated. Lactic acid normal and metabolic panel essentially normal. Due to reported abdominal pain, CT abd/pelvis performed showing free peritoneal fluid but no complications following inguinal hernia repair, and no other cause for abdominal pain. It also showed small pleural effusions and the infiltrate.  Review of Systems: Pt confusion limits ROS though he denies chest pain, dyspnea, palpitations, nausea, vomiting, dysuria. His daughter states he complains of abdominal pain since hernia surgery though there's been no rash or discharge from the area. Also per HPI. All others reviewed and are negative.   Past Medical History:  Diagnosis Date  . A-fib ([place])   . Angina pectoris ([place])   .  Asymptomatic microscopic hematuria   . Cardiac defibrillator in place   . Cardiac pacemaker   . CHF (congestive heart failure) ([place])   . Coronary artery disease   . Hyperlipidemia   . Hypertension   . Long term (current) use of anticoagulants    Past Surgical History:  Procedure Laterality Date  . CARDIAC DEFIBRILLATOR PLACEMENT  09/26/2009  . CORONARY ARTERY BYPASS GRAFT  2005  . CORONARY ARTERY BYPASS GRAFT    . IMPLANTABLE CARDIOVERTER DEFIBRILLATOR (ICD) GENERATOR CHANGE N/A 08/23/2014   Procedure: ICD GENERATOR CHANGE;  Surgeon: Sanda Klein, MD;  Location: [facility];  Service: Cardiovascular;  Laterality: N/A;  . MITRAL VALVE REPAIR (MV)/CORONARY ARTERY BYPASS GRAFTING (CABG)    . MITRAL VALVULOPLASTY  2005   - Never smoker, non drinker who lives with wife who also has dementia at [facility] since last December. Gets around with a walker when he's made to, otherwise walks unassisted. Dependent for [facility].  Allergies  Allergen Reactions  . Rosuvastatin Calcium Other (See Comments)    Myalgia  . Statins Other (See Comments)    Myalgia   Family History  Problem Relation Age of Onset  . Cancer Father   . Diabetes Brother    - Family history otherwise reviewed and not pertinent.  Prior to Admission medications   Medication Sig Start Date End Date Taking? Authorizing Provider  acetaminophen (TYLENOL) 325 MG tablet Take 650 mg by mouth every 4 (four) hours as needed (pain). Do not exceed 3g in 24 hours   Yes [provider]  apixaban (ELIQUIS) 5 MG TABS tablet Take 5 mg  by mouth 2 (two) times daily.   Yes [provider]  Benzocaine-Menthol (CHLORASEPTIC SORE THROAT MT) Use as directed 2 sprays in the mouth or throat every 6 (six) hours as needed (sore throat).   Yes [provider]  brimonidine (ALPHAGAN) 0.2 % ophthalmic solution Place 1 drop into both eyes 3 (three) times daily. 09/09/17  Yes Marletta Lor, MD  carvedilol (COREG) 3.125 MG  tablet TAKE ONE TABLET BY MOUTH TWICE DAILY WITH MEALS Patient taking differently: Take 3.125 mg by mouth 2 (two) times daily.  08/25/17  Yes Marletta Lor, MD  divalproex (DEPAKOTE) 125 MG DR tablet Take 125 mg by mouth every evening.   Yes [provider]  docusate sodium (COLACE) 100 MG capsule Take 100 mg by mouth 2 (two) times daily.   Yes [provider]  donepezil (ARICEPT) 10 MG tablet Take 10 mg by mouth at bedtime.  07/08/18  Yes [provider]  ezetimibe (ZETIA) 10 MG tablet Take 1 tablet (10 mg total) daily by mouth. Patient taking differently: Take 10 mg by mouth at bedtime.  10/24/17  Yes Marletta Lor, MD  finasteride (PROSCAR) 5 MG tablet Take 5 mg by mouth daily.   Yes Festus Aloe, MD  LORazepam (ATIVAN) 0.5 MG tablet Take 0.5 mg by mouth every 12 (twelve) hours as needed for anxiety.   Yes [provider]  mirtazapine (REMERON) 15 MG tablet Take 15 mg by mouth at bedtime.    Yes [provider]  NUTRITIONAL SUPPLEMENT LIQD Take 120 mLs by mouth 3 (three) times daily. House supplement   Yes [provider]  nystatin (NYSTATIN) powder Apply topically every 6 (six) hours as needed (rash).    Yes [provider]  tamsulosin (FLOMAX) 0.4 MG CAPS capsule Take 0.4 mg by mouth at bedtime.  03/28/17  Yes Festus Aloe, MD    Physical Exam: Vitals:   08/28/18 1430 08/28/18 1445 08/28/18 1515 08/28/18 1700  BP: 131/76 132/67 131/68 122/62  Pulse: 69 71 76 70  Resp: (!) 24 (!) 23 14 18   Temp:    98.5 F (36.9 C)  TempSrc:  Rectal  Oral  SpO2: (!) 83% 99% 98% 95%   Constitutional: 84 y.o. male in no distress, calm demeanor Eyes: Lids and conjunctivae normal, PERRL ENMT: Mucous membranes are moist. Posterior pharynx clear of any exudate or lesions. Poor dentition.  Neck: normal, supple, no masses, no thyromegaly Respiratory: Non-labored breathing room air without accessory muscle use. Crackles in  right lower, mid-lung zones, otherwise clear without crackles or wheezes. Cardiovascular: Regular rate and rhythm, no murmurs, rubs, or gallops. No carotid bruits. No JVD. No LE edema. 2+ pedal pulses. Abdomen: Normoactive bowel sounds. No tenderness, non-distended, and no masses palpated. No hepatosplenomegaly. Left inguinal surgical scar has healed without erythema or discharge, nontender. GU: Condom catheter Musculoskeletal: No clubbing / cyanosis. No joint deformity upper and lower extremities. Good ROM, no contractures. Normal muscle tone.  Skin: Warm, dry. Sternotomy scar noted. ICD palpable, nontender in upper left chest. No rashes noted.  Neurologic: CN II-XII grossly intact. No aphasia.. No focal deficits in motor strength or sensation in all extremities.  Psychiatric: Alert, oriented only to name. Euthymic mood with pleasant affect.   Labs on Admission: I have personally reviewed following labs and imaging studies  CBC: Recent Labs  Lab 08/28/18 1006  WBC 10.8*  NEUTROABS 9.4*  HGB 13.4  HCT 39.4  MCV 99.7  PLT 102*   Basic  Metabolic Panel: Recent Labs  Lab 08/28/18 1006  NA 139  K 4.0  CL 103  CO2 28  GLUCOSE 116*  BUN 17  CREATININE 0.71  CALCIUM 9.5   GFR: CrCl cannot be calculated (Unknown ideal weight.). Liver Function Tests: Recent Labs  Lab 08/28/18 1006  AST 22  ALT 12  ALKPHOS 72  BILITOT 1.9*  PROT 6.7  ALBUMIN 3.8   No results for input(s): LIPASE, AMYLASE in the last 168 hours. No results for input(s): AMMONIA in the last 168 hours. Coagulation Profile: No results for input(s): INR, PROTIME in the last 168 hours. Cardiac Enzymes: No results for input(s): CKTOTAL, CKMB, CKMBINDEX, TROPONINI in the last 168 hours. BNP (last 3 results) No results for input(s): PROBNP in the last 8760 hours. HbA1C: No results for input(s): HGBA1C in the last 72 hours. CBG: No results for input(s): GLUCAP in the last 168 hours. Lipid Profile: No results  for input(s): CHOL, HDL, LDLCALC, TRIG, CHOLHDL, LDLDIRECT in the last 72 hours. Thyroid Function Tests: No results for input(s): TSH, T4TOTAL, FREET4, T3FREE, THYROIDAB in the last 72 hours. Anemia Panel: No results for input(s): VITAMINB12, FOLATE, FERRITIN, TIBC, IRON, RETICCTPCT in the last 72 hours. Urine analysis:    Component Value Date/Time   COLORURINE AMBER (A) 08/28/2018 1006   APPEARANCEUR CLEAR 08/28/2018 1006   LABSPEC 1.025 08/28/2018 1006   PHURINE 5.5 08/28/2018 1006   GLUCOSEU NEGATIVE 08/28/2018 1006   HGBUR SMALL (A) 08/28/2018 1006   BILIRUBINUR NEGATIVE 08/28/2018 1006   BILIRUBINUR n 02/27/2017 1400   KETONESUR NEGATIVE 08/28/2018 1006   PROTEINUR NEGATIVE 08/28/2018 1006   UROBILINOGEN 0.2 02/27/2017 1400   NITRITE NEGATIVE 08/28/2018 1006   LEUKOCYTESUR NEGATIVE 08/28/2018 1006    No results found for this or any previous visit (from the past 240 hour(s)).   Radiological Exams on Admission: Dg Chest 2 View  Result Date: 08/28/2018 CLINICAL DATA:  Cough EXAM: CHEST - 2 VIEW COMPARISON:  10/30/2017 FINDINGS: Consolidation in the right upper lobe inferiorly compatible with pneumonia. Cardiomegaly. Prior median sternotomy, CABG and valve replacement. Left AICD remains in place, unchanged. No confluent opacity on the left. No visible effusions or acute bony abnormality. IMPRESSION: Consolidation in the inferior right upper lobe compatible with pneumonia. Followup PA and lateral chest X-ray is recommended in 3-4 weeks following trial of antibiotic therapy to ensure resolution and exclude underlying malignancy. Cardiomegaly. Electronically Signed   By: Rolm Baptise M.D.   On: 08/28/2018 10:10   Ct Abdomen Pelvis W Contrast  Result Date: 08/28/2018 CLINICAL DATA:  84 year old male with abdominal pain and fever. Confusion. Status post hernia surgery in June this year. EXAM: CT ABDOMEN AND PELVIS WITH CONTRAST TECHNIQUE: Multidetector CT imaging of the abdomen and  pelvis was performed using the standard protocol following bolus administration of intravenous contrast. CONTRAST:  177mL ISOVUE-300 IOPAMIDOL (ISOVUE-300) INJECTION 61% COMPARISON:  Chest radiographs today reported separately. CT Abdomen and Pelvis 10/28/2017. FINDINGS: Lower chest: Partially visible consolidation in the right upper lobe (series 4, image 1). Mild superimposed bilateral lower lobe atelectasis. Trace bilateral layering pleural effusion. Stable cardiomegaly. No pericardial effusion. Prior CABG. Cardiac AICD. Hepatobiliary: Mild motion artifact, as well as streak artifact from the patient's arms. The liver and gallbladder are within normal limits. Pancreas: Negative. Spleen: Negative. Adrenals/Urinary Tract: Chronic adrenal gland thickening such as due to adrenal hyperplasia. Bilateral renal enhancement and contrast excretion is symmetric and within normal limits. Diminutive and unremarkable urinary bladder. Stomach/Bowel: Decreased retained stool in the  distal colon compared to 2018. Diverticulosis of the sigmoid colon, but no definite active inflammation. Chronic pararenal space stranding with no definite descending colon inflammation. Mild to moderate retained stool from the descending colon to the right colon. No convincing large bowel inflammation. Normal appendix is visible on coronal image 46. Negative terminal ileum. No oral contrast. No dilated small bowel. Largely decompressed stomach and duodenum. No abdominal free air. No free fluid identified in the upper abdomen. Vascular/Lymphatic: Aortoiliac calcified atherosclerosis. Major arterial structures in the abdomen and pelvis are patent. Suboptimal portal venous phase contrast. No lymphadenopathy. Reproductive: Sequelae of bilateral inguinal hernia repair, chronic mesh on the right side is stable since 2018. The left side has been repaired since the prior 2018 CT. No adverse features are identified. No regional inflammation or fluid. Chronic  prostate hypertrophy. Otherwise negative. Other: Trace pelvic free fluid (series 7, image 80). Musculoskeletal: Prior sternotomy. No acute osseous abnormality identified. IMPRESSION: 1. Trace pelvic free fluid is abnormal but nonspecific, and no inflammatory process is identified in the abdomen or pelvis. Normal appendix. 2. Partially visible right upper lobe consolidation/pneumonia as seen on chest radiographs today. Trace bilateral pleural fluid. 3. Previous bilateral inguinal hernia repair with no adverse features. 4.  Aortic Atherosclerosis (ICD10-I70.0). Electronically Signed   By: Genevie Ann M.D.   On: 08/28/2018 12:19   Assessment/Plan Principal Problem:   Aspiration pneumonia ([place]) Active Problems:   CAD (coronary artery disease)s/p CABG 2005   Mitral valve insufficiency s/p annuloplasty 2005   HTN (hypertension)   Mixed hyperlipidemia   CHB (complete heart block) s/p AV node ablation   Chronic diastolic heart failure ([facility])   History of repair of mitral valve   BPH (benign prostatic hyperplasia)   Dementia   Aspiration pneumonia:  - Empiric dysphagia diet until SLP can evaluate in AM - IV unasyn, monitor blood cultures, may be difficult to obtain sputum culture. - Trend leukocytosis in AM and check PCT - Strep, legionella antigen.  CAD s/p CABG, MVR 2005: No chest pain.  - Continue home coreg  Permanent AFib s/p AV node ablation and resultant CHB: Pacemaker dependent.  - No indication for telemetry. - Continue coreg and eliquis  Chronic HFpEF: Last echo in 2015 suggestive only of pulmonary HTN with preserved EF, no mention of diastolic dysfunction. EF rebounded significantly after rhythm controlled. Appears euvolemic. - Continue BP med - Monitor I/O, daily weights, clinical exam  BPH:  - Continue flomax, finasteride, monitor UOP  Left inguinal hernia s/p recent repair: No postoperative complications based on exam and CT.   Hyperlipidemia:  - Continue zetia. Statin  intolerant.  Dementia: At very high risk of delirium, discussed at length with family.  - Hold reported prn ativan with risk of respiratory depression in his age - Try a low dose of seroquel qHS prn agitation. He's pacemaker dependent with ICD in place. - Continue aricept, remeron. Unable to confirm he takes depakote and doesn't have hx seizures, so will hold for now.  - Delirium precautions, asked family to be at bedside as much as possible.  - Fall precautions  Hyperbilirubinemia: Otherwise normal LFTs. Liver and gallbladder appear normal on CT. No anemia.  - Recheck in AM  DVT prophylaxis: Eliquis  Code Status: DNR confirmed at admission  Family Communication: Daughter at bedside Disposition Plan: Hopeful for return to ALF Consults called: None  Admission status: Inpatient. The patient has advanced age and multiple comorbidities putting him at high risk of decompensation.    Ryan  Jeannette How, MD [facility] [email or web address] Password [email or web address] 08/28/2018, 5:56 PM

## 2018-08-28 NOTE — ED Notes (Signed)
Pt returned from xr

## 2018-08-28 NOTE — ED Provider Notes (Signed)
[facility] EMERGENCY DEPARTMENT Provider Note   CSN: [id number] Arrival date & time: 08/28/18  7829     History   Chief Complaint Chief Complaint  Patient presents with  . Fever    HPI Grant Lawrence is a 84 y.o. male.  Level 5 caveat due to history of dementia.  Patient sent from facility with concern for change in his mental status, cough, possible fever.  According to chart patient has history of heart disease, high cholesterol, hypertension.  Facility concern for infection and possible pneumonia.  The history is provided by the EMS personnel.  Fever   This is a new problem. The current episode started 12 to 24 hours ago. The problem occurs daily. The maximum temperature noted was 100 to 100.9 F. He has tried nothing for the symptoms. The treatment provided no relief.    Past Medical History:  Diagnosis Date  . A-fib ([place])   . Angina pectoris ([place])   . Asymptomatic microscopic hematuria   . Cardiac defibrillator in place   . Cardiac pacemaker   . CHF (congestive heart failure) ([place])   . Coronary artery disease   . Hyperlipidemia   . Hypertension   . Long term (current) use of anticoagulants     Patient Active Problem List   Diagnosis Date Noted  . Aspiration pneumonia ([place]) 08/28/2018  . Coronary artery disease involving coronary bypass graft of native heart without angina pectoris 03/06/2018  . Hypercholesterolemia 03/06/2018  . Dementia 09/09/2017  . BPH (benign prostatic hyperplasia) 07/22/2016  . Elevated PSA 07/22/2016  . PVCs (premature ventricular contractions) 07/03/2016  . History of repair of mitral valve 07/03/2016  . CAD (coronary artery disease)s/p CABG 2005 08/13/2014  . Mitral valve insufficiency s/p annuloplasty 2005 08/13/2014  . s/p CRT-D St Jude 2010,gen change Sept 2015 08/13/2014  . Elective replacement indicated for implantable cardioverter-defibrillator (ICD) reached July 03, 2014 08/13/2014  . HTN (hypertension) 08/13/2014    . Mixed hyperlipidemia 08/13/2014  . Aortic insufficiency 08/13/2014  . Tricuspid insufficiency 08/13/2014  . Statin intolerance 08/13/2014  . CHB (complete heart block) s/p AV node ablation 08/13/2014  . Chronic diastolic heart failure ([place]) 08/13/2014    Past Surgical History:  Procedure Laterality Date  . CARDIAC DEFIBRILLATOR PLACEMENT  09/26/2009  . CORONARY ARTERY BYPASS GRAFT  2005  . CORONARY ARTERY BYPASS GRAFT    . IMPLANTABLE CARDIOVERTER DEFIBRILLATOR (ICD) GENERATOR CHANGE N/A 08/23/2014   Procedure: ICD GENERATOR CHANGE;  Surgeon: Sanda Klein, MD;  Location: [facility] CATH LAB;  Service: Cardiovascular;  Laterality: N/A;  . MITRAL VALVE REPAIR (MV)/CORONARY ARTERY BYPASS GRAFTING (CABG)    . MITRAL VALVULOPLASTY  2005        Home Medications    Prior to Admission medications   Medication Sig Start Date End Date Taking? Authorizing Provider  acetaminophen (TYLENOL) 325 MG tablet Take 650 mg by mouth every 6 (six) hours as needed.   Yes [provider]  apixaban (ELIQUIS) 5 MG TABS tablet Take 5 mg by mouth 2 (two) times daily.   Yes [provider]  brimonidine (ALPHAGAN) 0.2 % ophthalmic solution Place 1 drop into both eyes 3 (three) times daily. 09/09/17  Yes Marletta Lor, MD  carvedilol (COREG) 3.125 MG tablet TAKE ONE TABLET BY MOUTH TWICE DAILY WITH MEALS 08/25/17  Yes Marletta Lor, MD  docusate sodium (COLACE) 100 MG capsule Take 100 mg by mouth 2 (two) times daily.   Yes [provider]  donepezil (ARICEPT)  10 MG tablet  07/08/18  Yes [provider]  ezetimibe (ZETIA) 10 MG tablet Take 1 tablet (10 mg total) daily by mouth. 10/24/17  Yes Marletta Lor, MD  finasteride (PROSCAR) 5 MG tablet Take 5 mg by mouth daily.   Yes Festus Aloe, MD  LORazepam (ATIVAN) 0.5 MG tablet Take 0.5 mg by mouth every 12 (twelve) hours as needed for anxiety.   Yes [provider]  mirtazapine (REMERON) 7.5 MG tablet  Take 15 mg by mouth at bedtime.    Yes [provider]  nystatin (NYSTATIN) powder Apply topically 4 (four) times daily.   Yes [provider]  tamsulosin (FLOMAX) 0.4 MG CAPS capsule Take 1 capsule by mouth every evening. 03/28/17  Yes Festus Aloe, MD    Family History Family History  Problem Relation Age of Onset  . Cancer Father   . Diabetes Brother     Social History Social History   Tobacco Use  . Smoking status: Never Smoker  . Smokeless tobacco: Never Used  Substance Use Topics  . Alcohol use: No  . Drug use: No     Allergies   Rosuvastatin calcium and Statins   Review of Systems Review of Systems  Unable to perform ROS: Dementia  Constitutional: Positive for fever.     Physical Exam Updated Vital Signs  ED Triage Vitals  Enc Vitals Group     BP 08/28/18 0932 136/75     Pulse Rate 08/28/18 0932 74     Resp 08/28/18 0932 18     Temp 08/28/18 0932 98.5 F (36.9 C)     Temp Source 08/28/18 0932 Oral     SpO2 08/28/18 0932 99 %     Weight --      Height --      Head Circumference --      Peak Flow --      Pain Score 08/28/18 0933 0     Pain Loc --      Pain Edu? --      Excl. in [place]? --     Physical Exam  Constitutional: He appears well-developed and well-nourished.  HENT:  Head: Normocephalic and atraumatic.  Mouth/Throat: Oropharynx is clear and moist. No oropharyngeal exudate.  Eyes: Pupils are equal, round, and reactive to light. Conjunctivae are normal.  Neck: Normal range of motion. Neck supple.  Cardiovascular: Normal rate, regular rhythm, normal heart sounds and intact distal pulses.  No murmur heard. Pulmonary/Chest: Breath sounds normal. No stridor. No respiratory distress.  diminished throughout  Abdominal: Soft. Bowel sounds are normal. He exhibits no distension. There is no tenderness.  Musculoskeletal: He exhibits no edema.  Neurological: He is alert.  Skin: Skin is warm and dry.  Psychiatric: He has a  normal mood and affect.  Nursing note and vitals reviewed.    ED Treatments / Results  Labs (all labs ordered are listed, but only abnormal results are displayed) Labs Reviewed  CBC WITH DIFFERENTIAL/PLATELET - Abnormal; Notable for the following components:      Result Value   WBC 10.8 (*)    RBC 3.95 (*)    Platelets 102 (*)    Neutro Abs 9.4 (*)    All other components within normal limits  COMPREHENSIVE METABOLIC PANEL - Abnormal; Notable for the following components:   Glucose, Bld 116 (*)    Total Bilirubin 1.9 (*)    All other components within normal limits  URINALYSIS, ROUTINE W REFLEX MICROSCOPIC - Abnormal; Notable  for the following components:   Color, Urine AMBER (*)    Hgb urine dipstick SMALL (*)    All other components within normal limits  URINALYSIS, MICROSCOPIC (REFLEX) - Abnormal; Notable for the following components:   Bacteria, UA MANY (*)    All other components within normal limits  URINE CULTURE  CULTURE, BLOOD (ROUTINE X 2)  CULTURE, BLOOD (ROUTINE X 2)  CULTURE, BLOOD (ROUTINE X 2)  CULTURE, BLOOD (ROUTINE X 2)  EXPECTORATED SPUTUM ASSESSMENT W REFEX TO RESP CULTURE  GRAM STAIN  HIV ANTIBODY (ROUTINE TESTING W REFLEX)  STREP PNEUMONIAE URINARY ANTIGEN  I-STAT CG4 LACTIC ACID, ED    EKG None  Radiology Dg Chest 2 View  Result Date: 08/28/2018 CLINICAL DATA:  Cough EXAM: CHEST - 2 VIEW COMPARISON:  10/30/2017 FINDINGS: Consolidation in the right upper lobe inferiorly compatible with pneumonia. Cardiomegaly. Prior median sternotomy, CABG and valve replacement. Left AICD remains in place, unchanged. No confluent opacity on the left. No visible effusions or acute bony abnormality. IMPRESSION: Consolidation in the inferior right upper lobe compatible with pneumonia. Followup PA and lateral chest X-ray is recommended in 3-4 weeks following trial of antibiotic therapy to ensure resolution and exclude underlying malignancy. Cardiomegaly. Electronically  Signed   By: Rolm Baptise M.D.   On: 08/28/2018 10:10   Ct Abdomen Pelvis W Contrast  Result Date: 08/28/2018 CLINICAL DATA:  84 year old male with abdominal pain and fever. Confusion. Status post hernia surgery in June this year. EXAM: CT ABDOMEN AND PELVIS WITH CONTRAST TECHNIQUE: Multidetector CT imaging of the abdomen and pelvis was performed using the standard protocol following bolus administration of intravenous contrast. CONTRAST:  116mL ISOVUE-300 IOPAMIDOL (ISOVUE-300) INJECTION 61% COMPARISON:  Chest radiographs today reported separately. CT Abdomen and Pelvis 10/28/2017. FINDINGS: Lower chest: Partially visible consolidation in the right upper lobe (series 4, image 1). Mild superimposed bilateral lower lobe atelectasis. Trace bilateral layering pleural effusion. Stable cardiomegaly. No pericardial effusion. Prior CABG. Cardiac AICD. Hepatobiliary: Mild motion artifact, as well as streak artifact from the patient's arms. The liver and gallbladder are within normal limits. Pancreas: Negative. Spleen: Negative. Adrenals/Urinary Tract: Chronic adrenal gland thickening such as due to adrenal hyperplasia. Bilateral renal enhancement and contrast excretion is symmetric and within normal limits. Diminutive and unremarkable urinary bladder. Stomach/Bowel: Decreased retained stool in the distal colon compared to 2018. Diverticulosis of the sigmoid colon, but no definite active inflammation. Chronic pararenal space stranding with no definite descending colon inflammation. Mild to moderate retained stool from the descending colon to the right colon. No convincing large bowel inflammation. Normal appendix is visible on coronal image 46. Negative terminal ileum. No oral contrast. No dilated small bowel. Largely decompressed stomach and duodenum. No abdominal free air. No free fluid identified in the upper abdomen. Vascular/Lymphatic: Aortoiliac calcified atherosclerosis. Major arterial structures in the abdomen  and pelvis are patent. Suboptimal portal venous phase contrast. No lymphadenopathy. Reproductive: Sequelae of bilateral inguinal hernia repair, chronic mesh on the right side is stable since 2018. The left side has been repaired since the prior 2018 CT. No adverse features are identified. No regional inflammation or fluid. Chronic prostate hypertrophy. Otherwise negative. Other: Trace pelvic free fluid (series 7, image 80). Musculoskeletal: Prior sternotomy. No acute osseous abnormality identified. IMPRESSION: 1. Trace pelvic free fluid is abnormal but nonspecific, and no inflammatory process is identified in the abdomen or pelvis. Normal appendix. 2. Partially visible right upper lobe consolidation/pneumonia as seen on chest radiographs today. Trace bilateral pleural fluid. 3. Previous  bilateral inguinal hernia repair with no adverse features. 4.  Aortic Atherosclerosis (ICD10-I70.0). Electronically Signed   By: Genevie Ann M.D.   On: 08/28/2018 12:19    Procedures Procedures (including critical care time)  Medications Ordered in ED Medications  acetaminophen (TYLENOL) tablet 650 mg (has no administration in time range)  carvedilol (COREG) tablet 3.125 mg (has no administration in time range)  ezetimibe (ZETIA) tablet 10 mg (has no administration in time range)  donepezil (ARICEPT) tablet 10 mg (has no administration in time range)  mirtazapine (REMERON) tablet 15 mg (has no administration in time range)  docusate sodium (COLACE) capsule 100 mg (has no administration in time range)  finasteride (PROSCAR) tablet 5 mg (has no administration in time range)  tamsulosin (FLOMAX) capsule 0.4 mg (has no administration in time range)  apixaban (ELIQUIS) tablet 5 mg (has no administration in time range)  brimonidine (ALPHAGAN) 0.2 % ophthalmic solution 1 drop (has no administration in time range)  enoxaparin (LOVENOX) injection 30 mg (has no administration in time range)  dextrose 5 %-0.9 % sodium chloride  infusion (has no administration in time range)  Ampicillin-Sulbactam (UNASYN) 3 g in sodium chloride 0.9 % 100 mL IVPB (has no administration in time range)  Ampicillin-Sulbactam (UNASYN) 3 g in sodium chloride 0.9 % 100 mL IVPB (0 g Intravenous Stopped 08/28/18 1153)  acetaminophen (TYLENOL) tablet 650 mg (650 mg Oral Given 08/28/18 1110)  lactated ringers bolus 1,000 mL (0 mLs Intravenous Stopped 08/28/18 1250)  iopamidol (ISOVUE-300) 61 % injection 100 mL (100 mLs Intravenous Contrast Given 08/28/18 1137)     Initial Impression / Assessment and Plan / ED Course  I have reviewed the triage vital signs and the nursing notes.  Pertinent labs & imaging results that were available during my care of the patient were reviewed by me and considered in my medical decision making (see chart for details).     Grant Lawrence is an 84 year old male with a history of dementia, recent hernia repair who presents to the ED with fever, altered mental status.  Patient with fever but otherwise normal vitals upon arrival.  Patient has had cough and recent difficulty with eating.  Patient continues to eat normal diet by mouth.  Has a history of dementia and is basically alert but happily confused.  Recently had hernia surgery and has been complaining of pain at his hernia repair site.  Overall the surgical site is well appearing and well healing.  Does not appear to be overly tender in that area will obtain CT abdomen and pelvis to rule out infectious process.  Will initiate sepsis work-up with blood cultures, chest x-ray, urine studies.  Patient given LR bolus.  Patient with likely aspiration pneumonia found on chest x-ray.  Normal lactic acid.  White count only mildly elevated.  Otherwise no significant anemia, electrolyte abnormality, kidney injury.  CT scan of the abdomen and pelvis showed no acute surgical findings.  No abscesses.  Patient admitted to [facility] for further care.  Hemodynamically stable throughout  my care.  No signs to suggest septic shock.  Patient started on IV Unasyn for likely aspiration pneumonia.  This chart was dictated using voice recognition software.  Despite best efforts to proofread,  errors can occur which can change the documentation meaning.   Final Clinical Impressions(s) / ED Diagnoses   Final diagnoses:  Aspiration pneumonia, unspecified aspiration pneumonia type, unspecified laterality, unspecified part of lung [facility])    ED Discharge Orders  None       Lennice Sites, DO 08/28/18 1345

## 2018-08-28 NOTE — ED Notes (Signed)
MD at bedside discussing plan of care and results with patient and family.

## 2018-08-28 NOTE — ED Notes (Signed)
Pt. Changed and cleaned / placed in new adult brief after being given Tylenol for fever.  Pt. Tolerated well with no difficulty.  Pt. Back to resting with eyes closed after being rolled to side to keep off of his buttocks.

## 2018-08-28 NOTE — ED Notes (Signed)
Pt family at bedside at this time. Reports that pt had hernia surgery in June of 2019. Reported that pt was c/o increased pain in that area yesterday. Family reports that pt is more sleepy that usual.

## 2018-08-28 NOTE — ED Notes (Signed)
Patient resting at this time. Family has left. Will meet patient at [facility] when admitted. Daughter took patient's belongings with her when she left. Report given to [place], Therapist, sports. Care transferred at this time.

## 2018-08-28 NOTE — ED Notes (Signed)
Pt to CT at this time.

## 2018-08-28 NOTE — ED Notes (Signed)
Pt. Has stage 2 pressure injury to the buttocks ... No dressing placed by ED .... No oozing or bleeding from pressure injury.  This pressure injury was on Pt. Upon assessment of Pt. When Pt. Arrived to ED on 08-28-2018.

## 2018-08-29 DIAGNOSIS — N401 Enlarged prostate with lower urinary tract symptoms: Secondary | ICD-10-CM | POA: Diagnosis not present

## 2018-08-29 DIAGNOSIS — E782 Mixed hyperlipidemia: Secondary | ICD-10-CM

## 2018-08-29 DIAGNOSIS — J690 Pneumonitis due to inhalation of food and vomit: Secondary | ICD-10-CM | POA: Diagnosis not present

## 2018-08-29 DIAGNOSIS — I2510 Atherosclerotic heart disease of native coronary artery without angina pectoris: Secondary | ICD-10-CM | POA: Diagnosis not present

## 2018-08-29 DIAGNOSIS — I442 Atrioventricular block, complete: Secondary | ICD-10-CM | POA: Diagnosis not present

## 2018-08-29 LAB — URINE CULTURE

## 2018-08-29 LAB — CBC
HCT: 36.6 % — ABNORMAL LOW (ref 39.0–52.0)
Hemoglobin: 12.1 g/dL — ABNORMAL LOW (ref 13.0–17.0)
MCH: 33.7 pg (ref 26.0–34.0)
MCHC: 33.1 g/dL (ref 30.0–36.0)
MCV: 101.9 fL — ABNORMAL HIGH (ref 78.0–100.0)
Platelets: 98 10*3/uL — ABNORMAL LOW (ref 150–400)
RBC: 3.59 MIL/uL — ABNORMAL LOW (ref 4.22–5.81)
RDW: 12.2 % (ref 11.5–15.5)
WBC: 8.2 10*3/uL (ref 4.0–10.5)

## 2018-08-29 LAB — PROCALCITONIN: Procalcitonin: 0.13 ng/mL

## 2018-08-29 LAB — STREP PNEUMONIAE URINARY ANTIGEN: Strep Pneumo Urinary Antigen: NEGATIVE

## 2018-08-29 LAB — LEGIONELLA PNEUMOPHILA SEROGP 1 UR AG: L. pneumophila Serogp 1 Ur Ag: NEGATIVE

## 2018-08-29 MED ORDER — AMOXICILLIN-POT CLAVULANATE 875-125 MG PO TABS: 1.0000 | ORAL_TABLET | Freq: Two times a day (BID) | ORAL | 0 refills | Status: DC

## 2018-08-29 MED ORDER — STARCH (THICKENING) PO POWD
ORAL | Status: DC | PRN
  Filled 2018-08-29: qty 227

## 2018-08-29 MED ORDER — RESOURCE THICKENUP CLEAR PO POWD
ORAL | Status: DC | PRN
  Filled 2018-08-29: qty 125

## 2018-08-29 NOTE — Plan of Care (Signed)

## 2018-08-29 NOTE — Progress Notes (Signed)
Patient discharged to [facility], daughter [name] to transport patient. Discontinued IV tolerated well. Report given to [employer]. Patient dressed appropriately, no distress noted.

## 2018-08-29 NOTE — Care Management CC44 (Signed)
Condition Code 44 Documentation Completed  Patient Details  Name: Grant Lawrence MRN: [id number] Date of Birth: 24-Jan-1931   Condition Code 44 given:  Yes Patient signature on Condition Code 44 notice:  Yes Documentation of 2 MD's agreement:  Yes Code 44 added to claim:  Yes    Claudie Leach, RN 08/29/2018, 3:25 PM

## 2018-08-29 NOTE — Evaluation (Signed)
Physical Therapy Evaluation Patient Details Name: Grant Lawrence MRN: [id number] DOB: 25-Aug-1931 Today's Date: 08/29/2018   History of Present Illness  Pt adm from ALF with aspiration PNA. PMH - dementia, cabg, htn, hernia repair,   Clinical Impression  Pt presents to PT close to baseline with mobility. Will follow acutely but doubt will need PT at DC. From PT standpoint can go back to ALF.    Follow Up Recommendations No PT follow up    Equipment Recommendations  None recommended by PT    Recommendations for Other Services       Precautions / Restrictions Precautions Precautions: Fall Restrictions Weight Bearing Restrictions: No      Mobility  Bed Mobility Overal bed mobility: Needs Assistance Bed Mobility: Supine to Sit     Supine to sit: Supervision     General bed mobility comments: Incr time and cues to initiate  Transfers Overall transfer level: Needs assistance Equipment used: Rolling walker (2 wheeled) Transfers: Sit to/from Stand Sit to Stand: Min guard         General transfer comment: Assist to stabilize walker due to pt pulling up on walker  Ambulation/Gait Ambulation/Gait assistance: Min guard Gait Distance (Feet): 200 Feet Assistive device: 4-wheeled walker;Rolling walker (2 wheeled) Gait Pattern/deviations: Step-through pattern;Decreased step length - right;Decreased step length - left;Trunk flexed;Wide base of support Gait velocity: decr Gait velocity interpretation: 1.31 - 2.62 ft/sec, indicative of limited community ambulator General Gait Details: assist for safety and lines. Pt bothered by condom cath which hindered his gait pattern  Stairs            Wheelchair Mobility    Modified Rankin (Stroke Patients Only)       Balance Overall balance assessment: Needs assistance Sitting-balance support: No upper extremity supported;Feet supported Sitting balance-Leahy Scale: Good     Standing balance support: Single extremity  supported Standing balance-Leahy Scale: Poor Standing balance comment: UE support and supervision for static standing                             Pertinent Vitals/Pain Pain Assessment: Faces Faces Pain Scale: Hurts little more Pain Location: rt knee/hamstring with initial movement Pain Descriptors / Indicators: Grimacing Pain Intervention(s): Limited activity within patient's tolerance;Monitored during session    Home Living Family/patient expects to be discharged to:: Assisted living               Home Equipment: Walker - 2 wheels      Prior Function Level of Independence: Needs assistance   Gait / Transfers Assistance Needed: Amb with rolling walker           Hand Dominance        Extremity/Trunk Assessment   Upper Extremity Assessment Upper Extremity Assessment: Defer to OT evaluation    Lower Extremity Assessment Lower Extremity Assessment: Generalized weakness       Communication   Communication: No difficulties  Cognition Arousal/Alertness: Awake/alert Behavior During Therapy: WFL for tasks assessed/performed Overall Cognitive Status: History of cognitive impairments - at baseline                                        General Comments      Exercises     Assessment/Plan    PT Assessment Patient needs continued PT services  PT Problem List Decreased strength;Decreased balance;Decreased mobility;Decreased cognition;Decreased  knowledge of use of DME       PT Treatment Interventions DME instruction;Gait training;Functional mobility training;Therapeutic activities;Therapeutic exercise;Balance training;Patient/family education    PT Goals (Current goals can be found in the Care Plan section)  Acute Rehab PT Goals Patient Stated Goal: pt unable to state PT Goal Formulation: With patient/family Time For Goal Achievement: 09/05/18 Potential to Achieve Goals: Good    Frequency Min 3X/week   Barriers to discharge         Co-evaluation               AM-PAC PT "6 Clicks" Daily Activity  Outcome Measure Difficulty turning over in bed (including adjusting bedclothes, sheets and blankets)?: A Little Difficulty moving from lying on back to sitting on the side of the bed? : A Little Difficulty sitting down on and standing up from a chair with arms (e.g., wheelchair, bedside commode, etc,.)?: Unable Help needed moving to and from a bed to chair (including a wheelchair)?: A Little Help needed walking in hospital room?: A Little Help needed climbing 3-5 steps with a railing? : A Little 6 Click Score: 16    End of Session Equipment Utilized During Treatment: Gait belt Activity Tolerance: Patient tolerated treatment well Patient left: in chair;with call bell/phone within reach;with chair alarm set;with family/visitor present Nurse Communication: Mobility status PT Visit Diagnosis: Unsteadiness on feet (R26.81);Other abnormalities of gait and mobility (R26.89)    Time: 0383-3383 PT Time Calculation (min) (ACUTE ONLY): 31 min   Charges:   PT Evaluation $PT Eval Low Complexity: 1 Low PT Treatments $Gait Training: 8-22 mins        [place] Pager [phone] Office [place] 08/29/2018, 1:04 PM

## 2018-08-29 NOTE — Evaluation (Signed)
Clinical/Bedside Swallow Evaluation Patient Details  Name: Grant Lawrence MRN: [id number] Date of Birth: 11-Nov-1931  Today's Date: 08/29/2018 Time: SLP Start Time (ACUTE ONLY): 2725 SLP Stop Time (ACUTE ONLY): 1344 SLP Time Calculation (min) (ACUTE ONLY): 23 min  Past Medical History:  Past Medical History:  Diagnosis Date  . A-fib ([place])   . Angina pectoris ([place])   . Asymptomatic microscopic hematuria   . Cardiac defibrillator in place   . Cardiac pacemaker   . CHF (congestive heart failure) ([place])   . Coronary artery disease   . Hyperlipidemia   . Hypertension   . Long term (current) use of anticoagulants    Past Surgical History:  Past Surgical History:  Procedure Laterality Date  . CARDIAC DEFIBRILLATOR PLACEMENT  09/26/2009  . CORONARY ARTERY BYPASS GRAFT  2005  . CORONARY ARTERY BYPASS GRAFT    . IMPLANTABLE CARDIOVERTER DEFIBRILLATOR (ICD) GENERATOR CHANGE N/A 08/23/2014   Procedure: ICD GENERATOR CHANGE;  Surgeon: Sanda Klein, MD;  Location: [facility] [facility] CATH LAB;  Service: Cardiovascular;  Laterality: N/A;  . MITRAL VALVE REPAIR (MV)/CORONARY ARTERY BYPASS GRAFTING (CABG)    . MITRAL VALVULOPLASTY  2005   HPI:  84 y.o. male with a history of CAD s/p CABG and MVR 2005, AFib on eliquis s/p ICD, HFpEF, dementia, HTN, BPH, HLD, and recent inguinal hernia repair who presented to [facility] by EMS after an episode of weakness and confusion at breakfast at his assisted living facility. He had reported not feeling well starting the previous evening, but used the walker to get to breakfast this morning where he was found to be slumped over and weak. He may have had a fever though this is unclear and no medications were given prior to arrival. He's had a mild, intermittent, nonproductive cough as well. His daughter confirms the suspicion that he's having trouble swallowing based on intermittent choking with liquids and solids, though no work up has yet been done. CXR on 08/28/18 indicated  Consolidation in the inferior right upper lobe compatible with pneumonia; BSE ordered to r/o aspiration and address recent symptoms re: dysphagia.  Assessment / Plan / Recommendation Clinical Impression   Pt presents with normal oropharyngeal swallow with thin via cup/straw->solids without overt s/s of aspiration noted throughout BSE; daughter reports intermittent (occasional) difficulty with coughing during meals, but this was not noted during BSE; discussed cognitive-based dysphagia with daughter/family and need for potential OP MBS prn if symptoms persist.  Recommend initiate Regular/thin liquid diet; ST will s/o at this time. SLP Visit Diagnosis: Dysphagia, unspecified (R13.10)    Aspiration Risk  Mild aspiration risk    Diet Recommendation   Regular/thin liquids  Medication Administration: Whole meds with puree    Other  Recommendations Oral Care Recommendations: Oral care BID   Follow up Recommendations Other (comment)(OP MBS if symptoms persist with coughing inter. w/ meals)      Frequency and Duration   Evaluation only         Prognosis Prognosis for Safe Diet Advancement: Good Barriers to Reach Goals: Cognitive deficits      Swallow Study   General Date of Onset: 08/28/18 HPI: 84 y.o. male with a history of CAD s/p CABG and MVR 2005, AFib on eliquis s/p ICD, HFpEF, dementia, HTN, BPH, HLD, and recent inguinal hernia repair who presented to [facility] by EMS after an episode of weakness and confusion at breakfast at his assisted living facility. He had reported not feeling well starting the previous evening, but used the walker to  get to breakfast this morning where he was found to be slumped over and weak. He may have had a fever though this is unclear and no medications were given prior to arrival. He's had a mild, intermittent, nonproductive cough as well. His daughter confirms the suspicion that he's having trouble swallowing based on intermittent choking with liquids and solids,  though no work up has yet been done. Type of Study: Bedside Swallow Evaluation Previous Swallow Assessment: (n/a) Diet Prior to this Study: Dysphagia 3 (soft);Nectar-thick liquids Temperature Spikes Noted: No Respiratory Status: Room air History of Recent Intubation: No Behavior/Cognition: Alert;Cooperative;Confused Oral Cavity Assessment: Within Functional Limits Oral Care Completed by SLP: No Oral Cavity - Dentition: Adequate natural dentition Vision: Functional for self-feeding Self-Feeding Abilities: Able to feed self Patient Positioning: Upright in chair Baseline Vocal Quality: Low vocal intensity;Hoarse;Other (comment)(min hoarseness) Volitional Cough: Strong Volitional Swallow: Able to elicit    Oral/Motor/Sensory Function Overall Oral Motor/Sensory Function: Within functional limits   Ice Chips Ice chips: Not tested   Thin Liquid Thin Liquid: Within functional limits Presentation: Cup;Straw    Nectar Thick Nectar Thick Liquid: Within functional limits Presentation: Straw   Honey Thick Honey Thick Liquid: Not tested   Puree Puree: Within functional limits Presentation: Self Fed   Solid     Solid: Within functional limits Presentation: Self Fed      Elvina Sidle, M.S., CCC-SLP 08/29/2018,2:23 PM

## 2018-08-29 NOTE — Discharge Summary (Signed)
Physician Discharge Summary  Grant Lawrence FTD:[id number] DOB: 01-06-1931 DOA: 08/28/2018  PCP: Marletta Lor, MD  Admit date: 08/28/2018 Discharge date: 08/29/2018  Admitted From: ALF by way of [facility] Disposition: [facility]   Recommendations for Outpatient Follow-up:  1. Follow up with PCP in 1-2 weeks.  2. Suggest recheck CMP to monitor isolated bilirubin elevation found on labs.  Home Health: None Equipment/Devices: None new Discharge Condition: Stable CODE STATUS: DNR Diet recommendation: Heart healthy, aspiration precautions though no specific dietary modification recommended  Brief/Interim Summary: Grant Lawrence is an 84 y.o. male with a history of CAD s/p CABG and MVR 2005, AFib on eliquis s/p ICD, HFpEF, dementia, HTN, BPH, HLD, and recent inguinal hernia repair who presented to [facility] by EMS after an episode of weakness and confusion at breakfast at his assisted living facility. He had reported not feeling well starting the previous evening, but used the walker to get to breakfast this morning where he was found to be slumped over and weak. He may have had a fever though this is unclear and no medications were given prior to arrival. He's had a mild, intermittent, nonproductive cough as well. His daughter confirms the suspicion that he's having trouble swallowing based on intermittent choking with liquids and solids, though no work up had yet been done.  On arrival to [facility] ED he was noted to be febrile but otherwise hemodynamically stable. CXR demonstrated well-defined inferior RUL infiltrate and WBC was elevated. Lactic acid normal and metabolic panel essentially normal. Due to reported abdominal pain, CT abd/pelvis performed showing free peritoneal fluid but no complications following inguinal hernia repair, and no other cause for abdominal pain. It also showed small pleural effusions and the infiltrate. Admission was requested and the patient admitted to [facility] on IV unasyn.  Speech therapy has evaluated the patient, recommending standard aspiration precautions with a regular diet/thin liquids. He is afebrile, hemodynamically stable with normal respiratory effort, with reassuring procalcitonin, resolution of leukocytosis, and stable exam at the time of discharge returning to ALF.  Discharge Diagnoses:  Principal Problem:   Aspiration pneumonia ([place]) Active Problems:   CAD (coronary artery disease)s/p CABG 2005   Mitral valve insufficiency s/p annuloplasty 2005   HTN (hypertension)   Mixed hyperlipidemia   CHB (complete heart block) s/p AV node ablation   Chronic diastolic heart failure (H[facility])   History of repair of mitral valve   BPH (benign prostatic hyperplasia)   Dementia   Hypercholesterolemia  Aspiration pneumonia:  - SLP evaluated 9/21, recommends regular diet, thin liquids. Continue aspiration precautions. Cognitive-based dysphagia discussed with family, may need outpatient modified barium swallow study if symptoms persist. - Transition unasyn to augmentin to complete 7 days total Tx.  - Cultures NGTD at discharge.  CAD s/p CABG, MVR 2005: No chest pain.  - Continue home coreg  Permanent AFib s/p AV node ablation and resultant CHB: Pacemaker dependent.  - Continue coreg and eliquis  Chronic HFpEF: Last echo in 2015 suggestive only of pulmonary HTN with preserved EF, no mention of diastolic dysfunction. EF rebounded significantly after rhythm controlled. Appears euvolemic. - Continue home medications  BPH:  - Continue flomax, finasteride, monitor UOP  Left inguinal hernia s/p recent repair: No postoperative complications based on exam and CT.   Hyperlipidemia:  - Continue zetia. Statin intolerant.  Dementia: At very high risk of delirium, discussed at length with family.  - Continue aricept, remeron. Unable to confirm he takes depakote and doesn't have hx seizures, so  held while admitted.   Hyperbilirubinemia: Otherwise normal LFTs.  Liver and gallbladder appear normal on CT. No anemia.  - Recheck at follow up  Discharge Instructions Discharge Instructions    Diet - low sodium heart healthy   Complete by:  As directed    Discharge instructions   Complete by:  As directed    You were admitted for pneumonia and have improved with antibiotics. You are stable for discharge with the following recommendations:  - Continue augmentin twice daily for the next 6 days to complete the course started in the hospital.  - Take precautions when eating to avoid aspiration.  - Follow up with your PCP in the next 1-2 weeks for follow up labs.  - If your symptoms return, seek medical attention sooner     Allergies as of 08/29/2018      Reactions   Rosuvastatin Calcium Other (See Comments)   Myalgia   Statins Other (See Comments)   Myalgia      Medication List    TAKE these medications   acetaminophen 325 MG tablet Commonly known as:  TYLENOL Take 650 mg by mouth every 4 (four) hours as needed (pain). Do not exceed 3g in 24 hours   amoxicillin-clavulanate 875-125 MG tablet Commonly known as:  AUGMENTIN Take 1 tablet by mouth every 12 (twelve) hours.   brimonidine 0.2 % ophthalmic solution Commonly known as:  ALPHAGAN Place 1 drop into both eyes 3 (three) times daily.   carvedilol 3.125 MG tablet Commonly known as:  COREG TAKE ONE TABLET BY MOUTH TWICE DAILY WITH MEALS What changed:  when to take this   [place] MT Use as directed 2 sprays in the mouth or throat every 6 (six) hours as needed (sore throat).   divalproex 125 MG DR tablet Commonly known as:  DEPAKOTE Take 125 mg by mouth every evening.   docusate sodium 100 MG capsule Commonly known as:  COLACE Take 100 mg by mouth 2 (two) times daily.   donepezil 10 MG tablet Commonly known as:  ARICEPT Take 10 mg by mouth at bedtime.   ELIQUIS 5 MG Tabs tablet Generic drug:  apixaban Take 5 mg by mouth 2 (two) times daily.   ezetimibe 10 MG  tablet Commonly known as:  ZETIA Take 1 tablet (10 mg total) daily by mouth. What changed:  when to take this   finasteride 5 MG tablet Commonly known as:  PROSCAR Take 5 mg by mouth daily.   LORazepam 0.5 MG tablet Commonly known as:  ATIVAN Take 0.5 mg by mouth every 12 (twelve) hours as needed for anxiety.   mirtazapine 15 MG tablet Commonly known as:  REMERON Take 15 mg by mouth at bedtime.   NUTRITIONAL SUPPLEMENT Liqd Take 120 mLs by mouth 3 (three) times daily. House supplement   nystatin powder Generic drug:  nystatin Apply topically every 6 (six) hours as needed (rash).   tamsulosin 0.4 MG Caps capsule Commonly known as:  FLOMAX Take 0.4 mg by mouth at bedtime.      Follow-up Information    Marletta Lor, MD. Schedule an appointment as soon as possible for a visit in 2 week(s).   Specialty:  Internal Medicine Contact information: [place] [address] [phone]          Allergies  Allergen Reactions  . Rosuvastatin Calcium Other (See Comments)    Myalgia  . Statins Other (See Comments)    Myalgia  Consultations:  None  Procedures/Studies: Dg Chest 2 View  Result Date: 08/28/2018 CLINICAL DATA:  Cough EXAM: CHEST - 2 VIEW COMPARISON:  10/30/2017 FINDINGS: Consolidation in the right upper lobe inferiorly compatible with pneumonia. Cardiomegaly. Prior median sternotomy, CABG and valve replacement. Left AICD remains in place, unchanged. No confluent opacity on the left. No visible effusions or acute bony abnormality. IMPRESSION: Consolidation in the inferior right upper lobe compatible with pneumonia. Followup PA and lateral chest X-ray is recommended in 3-4 weeks following trial of antibiotic therapy to ensure resolution and exclude underlying malignancy. Cardiomegaly. Electronically Signed   By: Rolm Baptise M.D.   On: 08/28/2018 10:10   Ct Abdomen Pelvis W Contrast  Result Date: 08/28/2018 CLINICAL DATA:   83 year old male with abdominal pain and fever. Confusion. Status post hernia surgery in June this year. EXAM: CT ABDOMEN AND PELVIS WITH CONTRAST TECHNIQUE: Multidetector CT imaging of the abdomen and pelvis was performed using the standard protocol following bolus administration of intravenous contrast. CONTRAST:  151mL ISOVUE-300 IOPAMIDOL (ISOVUE-300) INJECTION 61% COMPARISON:  Chest radiographs today reported separately. CT Abdomen and Pelvis 10/28/2017. FINDINGS: Lower chest: Partially visible consolidation in the right upper lobe (series 4, image 1). Mild superimposed bilateral lower lobe atelectasis. Trace bilateral layering pleural effusion. Stable cardiomegaly. No pericardial effusion. Prior CABG. Cardiac AICD. Hepatobiliary: Mild motion artifact, as well as streak artifact from the patient's arms. The liver and gallbladder are within normal limits. Pancreas: Negative. Spleen: Negative. Adrenals/Urinary Tract: Chronic adrenal gland thickening such as due to adrenal hyperplasia. Bilateral renal enhancement and contrast excretion is symmetric and within normal limits. Diminutive and unremarkable urinary bladder. Stomach/Bowel: Decreased retained stool in the distal colon compared to 2018. Diverticulosis of the sigmoid colon, but no definite active inflammation. Chronic pararenal space stranding with no definite descending colon inflammation. Mild to moderate retained stool from the descending colon to the right colon. No convincing large bowel inflammation. Normal appendix is visible on coronal image 46. Negative terminal ileum. No oral contrast. No dilated small bowel. Largely decompressed stomach and duodenum. No abdominal free air. No free fluid identified in the upper abdomen. Vascular/Lymphatic: Aortoiliac calcified atherosclerosis. Major arterial structures in the abdomen and pelvis are patent. Suboptimal portal venous phase contrast. No lymphadenopathy. Reproductive: Sequelae of bilateral inguinal  hernia repair, chronic mesh on the right side is stable since 2018. The left side has been repaired since the prior 2018 CT. No adverse features are identified. No regional inflammation or fluid. Chronic prostate hypertrophy. Otherwise negative. Other: Trace pelvic free fluid (series 7, image 80). Musculoskeletal: Prior sternotomy. No acute osseous abnormality identified. IMPRESSION: 1. Trace pelvic free fluid is abnormal but nonspecific, and no inflammatory process is identified in the abdomen or pelvis. Normal appendix. 2. Partially visible right upper lobe consolidation/pneumonia as seen on chest radiographs today. Trace bilateral pleural fluid. 3. Previous bilateral inguinal hernia repair with no adverse features. 4.  Aortic Atherosclerosis (ICD10-I70.0). Electronically Signed   By: Genevie Ann M.D.   On: 08/28/2018 12:19     Subjective: Feels well, no dyspnea, cough or chest pain. No fever.   Discharge Exam: Vitals:   08/28/18 2223 08/29/18 0455  BP: 135/69 128/74  Pulse: 70 73  Resp: 16 16  Temp: (!) 97.4 F (36.3 C) 97.9 F (36.6 C)  SpO2: 99% 100%   General: Pt is alert, awake, not in acute distress Cardiovascular: RRR, S1/S2 +, no rubs, no gallops Respiratory: CTA bilaterally, no wheezing, no rhonchi Abdominal: Soft, NT, ND, bowel sounds +  Extremities: No edema, no cyanosis Neuro: Alert, pleasantly confused.  Labs: Basic Metabolic Panel: Recent Labs  Lab 08/28/18 1006  NA 139  K 4.0  CL 103  CO2 28  GLUCOSE 116*  BUN 17  CREATININE 0.71  CALCIUM 9.5   Liver Function Tests: Recent Labs  Lab 08/28/18 1006  AST 22  ALT 12  ALKPHOS 72  BILITOT 1.9*  PROT 6.7  ALBUMIN 3.8   CBC: Recent Labs  Lab 08/28/18 1006 08/29/18 0517  WBC 10.8* 8.2  NEUTROABS 9.4*  --   HGB 13.4 12.1*  HCT 39.4 36.6*  MCV 99.7 101.9*  PLT 102* 98*   Urinalysis    Component Value Date/Time   COLORURINE AMBER (A) 08/28/2018 1006   APPEARANCEUR CLEAR 08/28/2018 1006   LABSPEC  1.025 08/28/2018 1006   PHURINE 5.5 08/28/2018 1006   GLUCOSEU NEGATIVE 08/28/2018 1006   HGBUR SMALL (A) 08/28/2018 1006   BILIRUBINUR NEGATIVE 08/28/2018 1006   BILIRUBINUR n 02/27/2017 1400   KETONESUR NEGATIVE 08/28/2018 1006   PROTEINUR NEGATIVE 08/28/2018 1006   UROBILINOGEN 0.2 02/27/2017 1400   NITRITE NEGATIVE 08/28/2018 1006   LEUKOCYTESUR NEGATIVE 08/28/2018 1006    Microbiology Recent Results (from the past 240 hour(s))  Urine culture     Status: Abnormal   Collection Time: 08/28/18 10:06 AM  Result Value Ref Range Status   Specimen Description   Final    URINE, CATHETERIZED Performed at [facility], [address] [place], [place] [address]    Special Requests   Final    NONE Performed at [facility], [address] [place], [place] [address]    Culture MULTIPLE SPECIES PRESENT, SUGGEST RECOLLECTION (A)  Final   Report Status 08/29/2018 FINAL  Final  Blood culture (routine x 2)     Status: None (Preliminary result)   Collection Time: 08/28/18 10:20 AM  Result Value Ref Range Status   Specimen Description   Final    BLOOD BLOOD RIGHT FOREARM Performed at [facility], [address] [place], [place] [address]    Special Requests   Final    BOTTLES DRAWN AEROBIC AND ANAEROBIC Blood Culture adequate volume Performed at [facility], [address] [place], [place] [address]    Culture   Final    NO GROWTH < 24 HOURS Performed at [facility], [place] [address] [place], [place] [address]    Report Status PENDING  Incomplete  Blood culture (routine x 2)     Status: None (Preliminary result)   Collection Time: 08/28/18 10:43 AM  Result Value Ref Range Status   Specimen Description   Final    BLOOD LEFT ANTECUBITAL Performed at [facility], [address] [place], [place] [address]    Special Requests   Final    BOTTLES DRAWN AEROBIC AND ANAEROBIC Blood Culture adequate volume Performed at  [facility], [address] [place], [place] [address]    Culture   Final    NO GROWTH < 24 HOURS Performed at [facility], [place] [address], [place], [place] [address]    Report Status PENDING  Incomplete  MRSA PCR Screening     Status: None   Collection Time: 08/28/18  6:02 PM  Result Value Ref Range Status   MRSA by PCR NEGATIVE NEGATIVE Final    Comment:        The GeneXpert  MRSA Assay (FDA approved for NASAL specimens only), is one component of a comprehensive MRSA colonization surveillance program. It is not intended to diagnose MRSA infection nor to guide or monitor treatment for MRSA infections. Performed at [facility], [place] [address] [place], [place] [address]     Time coordinating discharge: Approximately 40 minutes  Patrecia Pour, MD  [facility] 08/29/2018, 2:45 PM Pager [phone]

## 2018-08-31 ENCOUNTER — Telehealth: Payer: Self-pay | Admitting: *Deleted

## 2018-08-31 NOTE — Telephone Encounter (Signed)
Unable to reach patient at time of TCM Call. Left message for patient to return call when available.  

## 2018-09-01 NOTE — Telephone Encounter (Signed)
Unable to reach patient at time of TCM Call. Left message for patient to return call when available.  

## 2018-09-02 LAB — CULTURE, BLOOD (ROUTINE X 2)
CULTURE: NO GROWTH
CULTURE: NO GROWTH
Special Requests: ADEQUATE
Special Requests: ADEQUATE

## 2018-09-04 ENCOUNTER — Other Ambulatory Visit: Payer: Self-pay

## 2018-09-04 NOTE — Patient Outreach (Signed)
[facility] Care Management  09/04/2018  ACEL NATZKE 1931-08-25 [id number]   Medication Adherence call to Mr. Elvert Cumpton left a message for patient to call back patient is due on Lisinopril 5 mg under [place].   [facility] Management Direct Dial [phone]  Fax [phone] [email or web address]

## 2018-09-15 ENCOUNTER — Telehealth: Payer: Self-pay | Admitting: Cardiology

## 2018-09-15 ENCOUNTER — Encounter: Payer: Medicare Other | Admitting: *Deleted

## 2018-09-15 NOTE — Telephone Encounter (Signed)
LMOVM reminding pt to send remote transmission.   

## 2018-09-21 ENCOUNTER — Ambulatory Visit (INDEPENDENT_AMBULATORY_CARE_PROVIDER_SITE_OTHER): Payer: Medicare Other | Admitting: *Deleted

## 2018-09-21 DIAGNOSIS — I442 Atrioventricular block, complete: Secondary | ICD-10-CM | POA: Diagnosis not present

## 2018-09-21 DIAGNOSIS — I5032 Chronic diastolic (congestive) heart failure: Secondary | ICD-10-CM

## 2018-09-22 NOTE — Progress Notes (Signed)
Remote ICD transmission.   

## 2018-09-24 ENCOUNTER — Encounter: Payer: Self-pay | Admitting: Cardiology

## 2018-11-17 ENCOUNTER — Encounter (HOSPITAL_BASED_OUTPATIENT_CLINIC_OR_DEPARTMENT_OTHER): Payer: Self-pay

## 2018-11-17 ENCOUNTER — Emergency Department (HOSPITAL_BASED_OUTPATIENT_CLINIC_OR_DEPARTMENT_OTHER)
Admission: EM | Admit: 2018-11-17 | Discharge: 2018-11-17 | Disposition: A | Discharge status: Home / Self Care | Payer: Medicare Other | Attending: Emergency Medicine | Admitting: Emergency Medicine

## 2018-11-17 ENCOUNTER — Emergency Department (HOSPITAL_BASED_OUTPATIENT_CLINIC_OR_DEPARTMENT_OTHER): Payer: Medicare Other

## 2018-11-17 ENCOUNTER — Other Ambulatory Visit: Payer: Self-pay

## 2018-11-17 DIAGNOSIS — Z7901 Long term (current) use of anticoagulants: Secondary | ICD-10-CM | POA: Insufficient documentation

## 2018-11-17 DIAGNOSIS — F0390 Unspecified dementia without behavioral disturbance: Secondary | ICD-10-CM | POA: Insufficient documentation

## 2018-11-17 DIAGNOSIS — Z951 Presence of aortocoronary bypass graft: Secondary | ICD-10-CM | POA: Insufficient documentation

## 2018-11-17 DIAGNOSIS — I2510 Atherosclerotic heart disease of native coronary artery without angina pectoris: Secondary | ICD-10-CM | POA: Insufficient documentation

## 2018-11-17 DIAGNOSIS — I110 Hypertensive heart disease with heart failure: Secondary | ICD-10-CM | POA: Diagnosis not present

## 2018-11-17 DIAGNOSIS — Z950 Presence of cardiac pacemaker: Secondary | ICD-10-CM | POA: Insufficient documentation

## 2018-11-17 DIAGNOSIS — N3001 Acute cystitis with hematuria: Secondary | ICD-10-CM | POA: Diagnosis not present

## 2018-11-17 DIAGNOSIS — I5032 Chronic diastolic (congestive) heart failure: Secondary | ICD-10-CM | POA: Diagnosis not present

## 2018-11-17 DIAGNOSIS — Z79899 Other long term (current) drug therapy: Secondary | ICD-10-CM | POA: Diagnosis not present

## 2018-11-17 DIAGNOSIS — R300 Dysuria: Secondary | ICD-10-CM | POA: Diagnosis present

## 2018-11-17 HISTORY — DX: Unspecified dementia, unspecified severity, without behavioral disturbance, psychotic disturbance, mood disturbance, and anxiety: F03.90

## 2018-11-17 LAB — I-STAT CG4 LACTIC ACID, ED: Lactic Acid, Venous: 0.85 mmol/L (ref 0.5–1.9)

## 2018-11-17 LAB — URINALYSIS, MICROSCOPIC (REFLEX)

## 2018-11-17 LAB — CBC
HCT: 39.3 % (ref 39.0–52.0)
Hemoglobin: 12.8 g/dL — ABNORMAL LOW (ref 13.0–17.0)
MCH: 32.7 pg (ref 26.0–34.0)
MCHC: 32.6 g/dL (ref 30.0–36.0)
MCV: 100.5 fL — ABNORMAL HIGH (ref 80.0–100.0)
PLATELETS: 136 10*3/uL — AB (ref 150–400)
RBC: 3.91 MIL/uL — ABNORMAL LOW (ref 4.22–5.81)
RDW: 13.2 % (ref 11.5–15.5)
WBC: 5.8 10*3/uL (ref 4.0–10.5)
nRBC: 0 % (ref 0.0–0.2)

## 2018-11-17 LAB — BASIC METABOLIC PANEL
ANION GAP: 4 — AB (ref 5–15)
BUN: 20 mg/dL (ref 8–23)
CO2: 28 mmol/L (ref 22–32)
Calcium: 9.7 mg/dL (ref 8.9–10.3)
Chloride: 104 mmol/L (ref 98–111)
Creatinine, Ser: 0.8 mg/dL (ref 0.61–1.24)
Glucose, Bld: 94 mg/dL (ref 70–99)
Potassium: 4.3 mmol/L (ref 3.5–5.1)
Sodium: 136 mmol/L (ref 135–145)

## 2018-11-17 LAB — URINALYSIS, ROUTINE W REFLEX MICROSCOPIC
Bilirubin Urine: NEGATIVE
GLUCOSE, UA: NEGATIVE mg/dL
Hgb urine dipstick: NEGATIVE
Ketones, ur: NEGATIVE mg/dL
NITRITE: NEGATIVE
PROTEIN: NEGATIVE mg/dL
Specific Gravity, Urine: 1.015 (ref 1.005–1.030)
pH: 6.5 (ref 5.0–8.0)

## 2018-11-17 MED ORDER — SODIUM CHLORIDE 0.9 % IV SOLN
INTRAVENOUS | Status: DC | PRN
  Administered 2018-11-17: 500 mL via INTRAVENOUS

## 2018-11-17 MED ORDER — CEPHALEXIN 500 MG PO CAPS: 500.0000 mg | ORAL_CAPSULE | Freq: Three times a day (TID) | ORAL | 0 refills | Status: DC

## 2018-11-17 MED ORDER — SODIUM CHLORIDE 0.9 % IV SOLN
1.0000 g | Freq: Once | INTRAVENOUS | Status: AC
  Administered 2018-11-17: 1 g via INTRAVENOUS
  Filled 2018-11-17: qty 10

## 2018-11-17 MED ORDER — SODIUM CHLORIDE 0.9 % IV BOLUS
1000.0000 mL | Freq: Once | INTRAVENOUS | Status: AC
  Administered 2018-11-17: 1000 mL via INTRAVENOUS

## 2018-11-17 MED FILL — CEPHALEXIN 500 MG CAPSULE: 500 | 7 days supply | Qty: 21 | Fill #0

## 2018-11-17 NOTE — ED Notes (Addendum)
Per daughter, Pt c/o groin pain yesterday. She states he ws c/o burning with urination. He was diagnosed at [facility] with UTI. He was sent to [facility] today for labs. Pt is a little disoriented, unable to tell me date of birth at initial assessment.

## 2018-11-17 NOTE — ED Provider Notes (Signed)
[facility] EMERGENCY DEPARTMENT Provider Note   CSN: [id number] Arrival date & time: 11/17/18  1242     History   Chief Complaint Chief Complaint  Patient presents with  . Urinary Tract Infection   Level 5 caveat: Dementia  HPI Grant Lawrence is a 84 y.o. male.  HPI 84 year old male who reported discomfort with urination earlier today the staff was sent to his primary care physician's office.  His primary care physician's office he was told he had a possible urinary tract infection and was given a prescription for antibiotics.  Because of a low blood pressure reading he was sent to the ER for further evaluation.  No significant change in his mental status per family.  He is been having burning with urination.  No vomiting or diarrhea.  No documented fevers.   Past Medical History:  Diagnosis Date  . A-fib ([place])   . Angina pectoris ([place])   . Asymptomatic microscopic hematuria   . Cardiac defibrillator in place   . Cardiac pacemaker   . CHF (congestive heart failure) ([place])   . Coronary artery disease   . Dementia ([place])   . Hyperlipidemia   . Hypertension   . Long term (current) use of anticoagulants     Patient Active Problem List   Diagnosis Date Noted  . Aspiration pneumonia ([place]) 08/28/2018  . Coronary artery disease involving coronary bypass graft of native heart without angina pectoris 03/06/2018  . Hypercholesterolemia 03/06/2018  . Dementia ([place]) 09/09/2017  . BPH (benign prostatic hyperplasia) 07/22/2016  . Elevated PSA 07/22/2016  . PVCs (premature ventricular contractions) 07/03/2016  . History of repair of mitral valve 07/03/2016  . CAD (coronary artery disease)s/p CABG 2005 08/13/2014  . Mitral valve insufficiency s/p annuloplasty 2005 08/13/2014  . s/p CRT-D St Jude 2010,gen change Sept 2015 08/13/2014  . Elective replacement indicated for implantable cardioverter-defibrillator (ICD) reached July 03, 2014 08/13/2014  . HTN (hypertension)  08/13/2014  . Mixed hyperlipidemia 08/13/2014  . Aortic insufficiency 08/13/2014  . Tricuspid insufficiency 08/13/2014  . Statin intolerance 08/13/2014  . CHB (complete heart block) s/p AV node ablation 08/13/2014  . Chronic diastolic heart failure ([place]) 08/13/2014    Past Surgical History:  Procedure Laterality Date  . CARDIAC DEFIBRILLATOR PLACEMENT  09/26/2009  . CORONARY ARTERY BYPASS GRAFT  2005  . CORONARY ARTERY BYPASS GRAFT    . IMPLANTABLE CARDIOVERTER DEFIBRILLATOR (ICD) GENERATOR CHANGE N/A 08/23/2014   Procedure: ICD GENERATOR CHANGE;  Surgeon: Sanda Klein, MD;  Location: [facility] CATH LAB;  Service: Cardiovascular;  Laterality: N/A;  . MITRAL VALVE REPAIR (MV)/CORONARY ARTERY BYPASS GRAFTING (CABG)    . MITRAL VALVULOPLASTY  2005        Home Medications    Prior to Admission medications   Medication Sig Start Date End Date Taking? Authorizing Provider  acetaminophen (TYLENOL) 325 MG tablet Take 650 mg by mouth every 4 (four) hours as needed (pain). Do not exceed 3g in 24 hours    [provider]  amoxicillin-clavulanate (AUGMENTIN) 875-125 MG tablet Take 1 tablet by mouth every 12 (twelve) hours. 08/29/18   Patrecia Pour, MD  apixaban (ELIQUIS) 5 MG TABS tablet Take 5 mg by mouth 2 (two) times daily.    [provider]  Benzocaine-Menthol (CHLORASEPTIC SORE THROAT MT) Use as directed 2 sprays in the mouth or throat every 6 (six) hours as needed (sore throat).    [provider]  brimonidine (ALPHAGAN) 0.2 % ophthalmic solution Place 1 drop into  both eyes 3 (three) times daily. 09/09/17   Marletta Lor, MD  carvedilol (COREG) 3.125 MG tablet TAKE ONE TABLET BY MOUTH TWICE DAILY WITH MEALS Patient taking differently: Take 3.125 mg by mouth 2 (two) times daily.  08/25/17   Marletta Lor, MD  divalproex (DEPAKOTE) 125 MG DR tablet Take 125 mg by mouth every evening.    [provider]  docusate sodium (COLACE) 100 MG capsule Take  100 mg by mouth 2 (two) times daily.    [provider]  donepezil (ARICEPT) 10 MG tablet Take 10 mg by mouth at bedtime.  07/08/18   [provider]  ezetimibe (ZETIA) 10 MG tablet Take 1 tablet (10 mg total) daily by mouth. Patient taking differently: Take 10 mg by mouth at bedtime.  10/24/17   Marletta Lor, MD  finasteride (PROSCAR) 5 MG tablet Take 5 mg by mouth daily.    Festus Aloe, MD  LORazepam (ATIVAN) 0.5 MG tablet Take 0.5 mg by mouth every 12 (twelve) hours as needed for anxiety.    [provider]  mirtazapine (REMERON) 15 MG tablet Take 15 mg by mouth at bedtime.     [provider]  NUTRITIONAL SUPPLEMENT LIQD Take 120 mLs by mouth 3 (three) times daily. House supplement    [provider]  nystatin (NYSTATIN) powder Apply topically every 6 (six) hours as needed (rash).     [provider]  tamsulosin (FLOMAX) 0.4 MG CAPS capsule Take 0.4 mg by mouth at bedtime.  03/28/17   Festus Aloe, MD    Family History Family History  Problem Relation Age of Onset  . Cancer Father   . Diabetes Brother     Social History Social History   Tobacco Use  . Smoking status: Never Smoker  . Smokeless tobacco: Never Used  Substance Use Topics  . Alcohol use: No  . Drug use: No     Allergies   Rosuvastatin calcium and Statins   Review of Systems Review of Systems  Unable to perform ROS: Dementia     Physical Exam Updated Vital Signs BP (!) 145/68 (BP Location: Right Arm)   Pulse 74   Temp 97.6 F (36.4 C) (Oral)   Resp 16   Ht 5\' 10"  (1.778 m)   Wt 65.3 kg   SpO2 100%   BMI 20.66 kg/m   Physical Exam  Constitutional: He is oriented to person, place, and time. He appears well-developed and well-nourished.  HENT:  Head: Normocephalic and atraumatic.  Eyes: EOM are normal.  Neck: Normal range of motion.  Cardiovascular: Normal rate, regular rhythm, normal heart sounds and intact distal pulses.    Pulmonary/Chest: Effort normal and breath sounds normal. No respiratory distress.  Abdominal: Soft. He exhibits no distension. There is no tenderness.  Musculoskeletal: Normal range of motion.  Neurological: He is alert and oriented to person, place, and time.  Skin: Skin is warm and dry.  Psychiatric: He has a normal mood and affect. Judgment normal.  Nursing note and vitals reviewed.    ED Treatments / Results  Labs (all labs ordered are listed, but only abnormal results are displayed) Labs Reviewed  CBC - Abnormal; Notable for the following components:      Result Value   RBC 3.91 (*)    Hemoglobin 12.8 (*)    MCV 100.5 (*)    Platelets 136 (*)    All other components within normal limits  BASIC METABOLIC PANEL - Abnormal; Notable  for the following components:   Anion gap 4 (*)    All other components within normal limits  CULTURE, BLOOD (ROUTINE X 2)  CULTURE, BLOOD (ROUTINE X 2)  URINE CULTURE  URINALYSIS, ROUTINE W REFLEX MICROSCOPIC  I-STAT CG4 LACTIC ACID, ED    EKG None  Radiology Dg Chest 2 View  Result Date: 11/17/2018 CLINICAL DATA:  UTI.  Weakness. EXAM: CHEST - 2 VIEW COMPARISON:  08/28/2018. FINDINGS: AICD in stable position. Prior cardiac valve replacement and CABG. Cardiomegaly with normal pulmonary vascularity. No focal infiltrate. Interval clearing of previously identified infiltrate. No pleural effusion or pneumothorax. No acute bony abnormality. IMPRESSION: 1. AICD in stable position. Prior cardiac valve replacement and CABG. Stable cardiomegaly. 2. No acute pulmonary infiltrate. Previously identified infiltrate noted in the right lung on chest x-ray of 08/28/2018 has cleared. Electronically Signed   By: Marcello [place]  Register   On: 11/17/2018 15:11    Procedures Procedures (including critical care time)  Medications Ordered in ED Medications  sodium chloride 0.9 % bolus 1,000 mL (1,000 mLs Intravenous New Bag/Given 11/17/18 1501)     Initial  Impression / Assessment and Plan / ED Course  I have reviewed the triage vital signs and the nursing notes.  Pertinent labs & imaging results that were available during my care of the patient were reviewed by me and considered in my medical decision making (see chart for details).     Well-appearing in the emergency department without significant derangements of his vital signs.  IV fluids now.  Labs, blood culture, urine culture.  IV Rocephin now.  Will readdress after return of his labs and urine.  Patient will be observed closely here in the emergency department.  He does not sound as though there is a drastic change in his mental status.  I think it is reasonable that the patient has received IV antibiotics and his vital signs remained stable in the emergency department he is likely safe for discharge back to the assisted living facility with oral antibiotics.  Patient family updated.  Care transferred to Dr Sherry Ruffing, MD @ 3:38 PM   Final Clinical Impressions(s) / ED Diagnoses   Final diagnoses:  None    ED Discharge Orders    None       Jola Schmidt, MD 11/17/18 1538

## 2018-11-17 NOTE — ED Triage Notes (Signed)
Per daughter/POA pt is a [facility] resident-was c/o dysuria yesterday-she took pt to [facility] PTA-was dx with UTI rx keflex and was advised to come to ED to r/o sepsis stating his BP was low-pt to triage in w/c-NAD-denies pain

## 2018-11-18 LAB — URINE CULTURE

## 2018-11-24 LAB — CULTURE, BLOOD (ROUTINE X 2)
CULTURE: NO GROWTH
CULTURE: NO GROWTH
Special Requests: ADEQUATE
Special Requests: ADEQUATE

## 2018-11-28 LAB — CUP PACEART REMOTE DEVICE CHECK
Battery Remaining Longevity: 9 mo
Battery Voltage: 2.74 V
Date Time Interrogation Session: 20191011070404
HighPow Impedance: 41 Ohm
HighPow Impedance: 41 Ohm
Implantable Lead Implant Date: 20101019
Implantable Lead Implant Date: 20101019
Implantable Lead Implant Date: 20101019
Implantable Lead Location: 753858
Implantable Lead Location: 753859
Implantable Lead Location: 753860
Implantable Lead Model: 1999
Implantable Lead Model: 7121
Lead Channel Impedance Value: 390 Ohm
Lead Channel Impedance Value: 410 Ohm
Lead Channel Pacing Threshold Amplitude: 0.75 V
Lead Channel Pacing Threshold Amplitude: 2.75 V
Lead Channel Pacing Threshold Pulse Width: 0.5 ms
Lead Channel Pacing Threshold Pulse Width: 1 ms
Lead Channel Sensing Intrinsic Amplitude: 10.1 mV
Lead Channel Setting Pacing Amplitude: 2.5 V
Lead Channel Setting Pacing Amplitude: 3.25 V
Lead Channel Setting Pacing Pulse Width: 0.5 ms
Lead Channel Setting Pacing Pulse Width: 1 ms
Lead Channel Setting Sensing Sensitivity: 0.5 mV
MDC IDC MSMT BATTERY REMAINING PERCENTAGE: 16 %
MDC IDC PG IMPLANT DT: 20150915
Pulse Gen Serial Number: 7207729

## 2018-12-23 ENCOUNTER — Telehealth: Payer: Self-pay

## 2018-12-23 NOTE — Telephone Encounter (Signed)
Left message for patient to remind of missed remote transmission.  

## 2019-01-12 ENCOUNTER — Emergency Department (HOSPITAL_COMMUNITY): Payer: Medicare Other

## 2019-01-12 ENCOUNTER — Inpatient Hospital Stay (HOSPITAL_COMMUNITY)
Admission: EM | Admit: 2019-01-12 | Discharge: 2019-01-14 | DRG: 177 | Disposition: A | Discharge status: Home Health | Payer: Medicare Other | Attending: Internal Medicine | Admitting: Internal Medicine

## 2019-01-12 DIAGNOSIS — Z833 Family history of diabetes mellitus: Secondary | ICD-10-CM

## 2019-01-12 DIAGNOSIS — F0390 Unspecified dementia without behavioral disturbance: Secondary | ICD-10-CM | POA: Diagnosis present

## 2019-01-12 DIAGNOSIS — J9601 Acute respiratory failure with hypoxia: Secondary | ICD-10-CM | POA: Diagnosis present

## 2019-01-12 DIAGNOSIS — I110 Hypertensive heart disease with heart failure: Secondary | ICD-10-CM | POA: Diagnosis present

## 2019-01-12 DIAGNOSIS — I509 Heart failure, unspecified: Secondary | ICD-10-CM | POA: Diagnosis present

## 2019-01-12 DIAGNOSIS — Z66 Do not resuscitate: Secondary | ICD-10-CM | POA: Diagnosis present

## 2019-01-12 DIAGNOSIS — Z79899 Other long term (current) drug therapy: Secondary | ICD-10-CM

## 2019-01-12 DIAGNOSIS — J189 Pneumonia, unspecified organism: Secondary | ICD-10-CM

## 2019-01-12 DIAGNOSIS — Z951 Presence of aortocoronary bypass graft: Secondary | ICD-10-CM

## 2019-01-12 DIAGNOSIS — I10 Essential (primary) hypertension: Secondary | ICD-10-CM | POA: Diagnosis present

## 2019-01-12 DIAGNOSIS — J690 Pneumonitis due to inhalation of food and vomit: Secondary | ICD-10-CM | POA: Diagnosis not present

## 2019-01-12 DIAGNOSIS — I4891 Unspecified atrial fibrillation: Secondary | ICD-10-CM | POA: Diagnosis present

## 2019-01-12 DIAGNOSIS — I252 Old myocardial infarction: Secondary | ICD-10-CM

## 2019-01-12 DIAGNOSIS — Z950 Presence of cardiac pacemaker: Secondary | ICD-10-CM

## 2019-01-12 DIAGNOSIS — N400 Enlarged prostate without lower urinary tract symptoms: Secondary | ICD-10-CM | POA: Diagnosis present

## 2019-01-12 DIAGNOSIS — I2510 Atherosclerotic heart disease of native coronary artery without angina pectoris: Secondary | ICD-10-CM | POA: Diagnosis present

## 2019-01-12 DIAGNOSIS — Z888 Allergy status to other drugs, medicaments and biological substances status: Secondary | ICD-10-CM

## 2019-01-12 DIAGNOSIS — R112 Nausea with vomiting, unspecified: Secondary | ICD-10-CM | POA: Diagnosis not present

## 2019-01-12 DIAGNOSIS — R627 Adult failure to thrive: Secondary | ICD-10-CM | POA: Diagnosis present

## 2019-01-12 DIAGNOSIS — R0902 Hypoxemia: Secondary | ICD-10-CM

## 2019-01-12 DIAGNOSIS — I442 Atrioventricular block, complete: Secondary | ICD-10-CM | POA: Diagnosis present

## 2019-01-12 DIAGNOSIS — J181 Lobar pneumonia, unspecified organism: Secondary | ICD-10-CM

## 2019-01-12 DIAGNOSIS — Z7901 Long term (current) use of anticoagulants: Secondary | ICD-10-CM

## 2019-01-12 DIAGNOSIS — E785 Hyperlipidemia, unspecified: Secondary | ICD-10-CM | POA: Diagnosis present

## 2019-01-12 LAB — COMPREHENSIVE METABOLIC PANEL
ALT: 18 U/L (ref 0–44)
AST: 28 U/L (ref 15–41)
Albumin: 4.1 g/dL (ref 3.5–5.0)
Alkaline Phosphatase: 72 U/L (ref 38–126)
Anion gap: 13 (ref 5–15)
BUN: 23 mg/dL (ref 8–23)
CO2: 22 mmol/L (ref 22–32)
Calcium: 9.9 mg/dL (ref 8.9–10.3)
Chloride: 107 mmol/L (ref 98–111)
Creatinine, Ser: 0.96 mg/dL (ref 0.61–1.24)
GFR calc Af Amer: >60 mL/min (ref >60)
Glucose, Bld: 141 mg/dL — ABNORMAL HIGH (ref 70–99)
Potassium: 4.9 mmol/L (ref 3.5–5.1)
SODIUM: 142 mmol/L (ref 135–145)
Total Bilirubin: 1.7 mg/dL — ABNORMAL HIGH (ref 0.3–1.2)
Total Protein: 7.2 g/dL (ref 6.5–8.1)

## 2019-01-12 LAB — CBC WITH DIFFERENTIAL/PLATELET
Abs Immature Granulocytes: 0.02 10*3/uL (ref 0.00–0.07)
Basophils Absolute: 0.1 10*3/uL (ref 0.0–0.1)
Basophils Relative: 1 %
Eosinophils Absolute: 0 10*3/uL (ref 0.0–0.5)
Eosinophils Relative: 1 %
HCT: 44.1 % (ref 39.0–52.0)
Hemoglobin: 14.5 g/dL (ref 13.0–17.0)
Immature Granulocytes: 0 %
Lymphocytes Relative: 11 %
Lymphs Abs: 0.7 10*3/uL (ref 0.7–4.0)
MCH: 32.7 pg (ref 26.0–34.0)
MCHC: 32.9 g/dL (ref 30.0–36.0)
MCV: 99.5 fL (ref 80.0–100.0)
MONOS PCT: 5 %
Monocytes Absolute: 0.3 10*3/uL (ref 0.1–1.0)
Neutro Abs: 5.1 10*3/uL (ref 1.7–7.7)
Neutrophils Relative %: 82 %
Platelets: 132 10*3/uL — ABNORMAL LOW (ref 150–400)
RBC: 4.43 MIL/uL (ref 4.22–5.81)
RDW: 12.6 % (ref 11.5–15.5)
WBC: 6.2 10*3/uL (ref 4.0–10.5)
nRBC: 0 % (ref 0.0–0.2)

## 2019-01-12 LAB — VALPROIC ACID LEVEL: Valproic Acid Lvl: <10 ug/mL — ABNORMAL LOW (ref 50.0–100.0)

## 2019-01-12 MED ORDER — IOHEXOL 300 MG/ML  SOLN
100.0000 mL | Freq: Once | INTRAMUSCULAR | Status: AC | PRN
  Administered 2019-01-12: 100 mL via INTRAVENOUS

## 2019-01-12 MED ORDER — SODIUM CHLORIDE 0.9 % IV SOLN
500.0000 mg | Freq: Once | INTRAVENOUS | Status: AC
  Administered 2019-01-12: 500 mg via INTRAVENOUS
  Filled 2019-01-12: qty 500

## 2019-01-12 MED ORDER — ONDANSETRON HCL 4 MG/2ML IJ SOLN
4.0000 mg | Freq: Once | INTRAMUSCULAR | Status: AC
  Administered 2019-01-12: 4 mg via INTRAVENOUS
  Filled 2019-01-12: qty 2

## 2019-01-12 MED ORDER — SODIUM CHLORIDE 0.9 % IV SOLN
1.0000 g | Freq: Once | INTRAVENOUS | Status: AC
  Administered 2019-01-12: 1 g via INTRAVENOUS
  Filled 2019-01-12: qty 10

## 2019-01-12 NOTE — ED Triage Notes (Addendum)
p arrived via [facility] from [facility] where staffed reported pt o be projectile vomiting since 1500 today. EMS expressed concern for aspiration. Pt not normally 02 dependent. Room air sats 85% on scene. 5Lpm [place] increased sats to 93%. EMS gave 4mg  zofran PTA. Vomiting controlled at time of triage. DNR @ BEDSIDE

## 2019-01-12 NOTE — ED Provider Notes (Signed)
[facility] EMERGENCY DEPARTMENT Provider Note   CSN: [id number] Arrival date & time: 01/12/19  1943     History   Chief Complaint Chief Complaint  Patient presents with  . Emesis    HPI Grant Lawrence is a 85 y.o. male. Level 5 caveat due to some dementia. HPI Sent in from [facility] assisted living.  Reportedly has had vomiting since earlier today.  Hypoxic on room air.  Has had a cough also. Past Medical History:  Diagnosis Date  . A-fib ([place])   . Angina pectoris ([place])   . Asymptomatic microscopic hematuria   . Cardiac defibrillator in place   . Cardiac pacemaker   . CHF (congestive heart failure) ([place])   . Coronary artery disease   . Dementia ([place])   . Hyperlipidemia   . Hypertension   . Long term (current) use of anticoagulants     Patient Active Problem List   Diagnosis Date Noted  . Aspiration pneumonia ([place]) 08/28/2018  . Coronary artery disease involving coronary bypass graft of native heart without angina pectoris 03/06/2018  . Hypercholesterolemia 03/06/2018  . Dementia ([place]) 09/09/2017  . BPH (benign prostatic hyperplasia) 07/22/2016  . Elevated PSA 07/22/2016  . PVCs (premature ventricular contractions) 07/03/2016  . History of repair of mitral valve 07/03/2016  . CAD (coronary artery disease)s/p CABG 2005 08/13/2014  . Mitral valve insufficiency s/p annuloplasty 2005 08/13/2014  . s/p CRT-D St Jude 2010,gen change Sept 2015 08/13/2014  . Elective replacement indicated for implantable cardioverter-defibrillator (ICD) reached July 03, 2014 08/13/2014  . HTN (hypertension) 08/13/2014  . Mixed hyperlipidemia 08/13/2014  . Aortic insufficiency 08/13/2014  . Tricuspid insufficiency 08/13/2014  . Statin intolerance 08/13/2014  . CHB (complete heart block) s/p AV node ablation 08/13/2014  . Chronic diastolic heart failure ([place]) 08/13/2014    Past Surgical History:  Procedure Laterality Date  . CARDIAC DEFIBRILLATOR PLACEMENT   09/26/2009  . CORONARY ARTERY BYPASS GRAFT  2005  . CORONARY ARTERY BYPASS GRAFT    . IMPLANTABLE CARDIOVERTER DEFIBRILLATOR (ICD) GENERATOR CHANGE N/A 08/23/2014   Procedure: ICD GENERATOR CHANGE;  Surgeon: Sanda Klein, MD;  Location: [facility] CATH LAB;  Service: Cardiovascular;  Laterality: N/A;  . MITRAL VALVE REPAIR (MV)/CORONARY ARTERY BYPASS GRAFTING (CABG)    . MITRAL VALVULOPLASTY  2005        Home Medications    Prior to Admission medications   Medication Sig Start Date End Date Taking? Authorizing Provider  acetaminophen (TYLENOL) 325 MG tablet Take 650 mg by mouth every 4 (four) hours as needed (pain). Do not exceed 3g in 24 hours    [provider]  amoxicillin-clavulanate (AUGMENTIN) 875-125 MG tablet Take 1 tablet by mouth every 12 (twelve) hours. 08/29/18   Patrecia Pour, MD  apixaban (ELIQUIS) 5 MG TABS tablet Take 5 mg by mouth 2 (two) times daily.    [provider]  Benzocaine-Menthol (CHLORASEPTIC SORE THROAT MT) Use as directed 2 sprays in the mouth or throat every 6 (six) hours as needed (sore throat).    [provider]  brimonidine (ALPHAGAN) 0.2 % ophthalmic solution Place 1 drop into both eyes 3 (three) times daily. 09/09/17   Marletta Lor, MD  carvedilol (COREG) 3.125 MG tablet TAKE ONE TABLET BY MOUTH TWICE DAILY WITH MEALS Patient taking differently: Take 3.125 mg by mouth 2 (two) times daily.  08/25/17   Marletta Lor, MD  cephALEXin (KEFLEX) 500 MG capsule Take 1 capsule (500 mg total) by mouth  3 (three) times daily. 11/17/18   Jola Schmidt, MD  divalproex (DEPAKOTE) 125 MG DR tablet Take 125 mg by mouth every evening.    [provider]  docusate sodium (COLACE) 100 MG capsule Take 100 mg by mouth 2 (two) times daily.    [provider]  donepezil (ARICEPT) 10 MG tablet Take 10 mg by mouth at bedtime.  07/08/18   [provider]  ezetimibe (ZETIA) 10 MG tablet Take 1 tablet (10 mg total) daily by  mouth. Patient taking differently: Take 10 mg by mouth at bedtime.  10/24/17   Marletta Lor, MD  finasteride (PROSCAR) 5 MG tablet Take 5 mg by mouth daily.    Festus Aloe, MD  LORazepam (ATIVAN) 0.5 MG tablet Take 0.5 mg by mouth every 12 (twelve) hours as needed for anxiety.    [provider]  mirtazapine (REMERON) 15 MG tablet Take 15 mg by mouth at bedtime.     [provider]  NUTRITIONAL SUPPLEMENT LIQD Take 120 mLs by mouth 3 (three) times daily. House supplement    [provider]  nystatin (NYSTATIN) powder Apply topically every 6 (six) hours as needed (rash).     [provider]  tamsulosin (FLOMAX) 0.4 MG CAPS capsule Take 0.4 mg by mouth at bedtime.  03/28/17   Festus Aloe, MD    Family History Family History  Problem Relation Age of Onset  . Cancer Father   . Diabetes Brother     Social History Social History   Tobacco Use  . Smoking status: Never Smoker  . Smokeless tobacco: Never Used  Substance Use Topics  . Alcohol use: No  . Drug use: No     Allergies   Rosuvastatin calcium and Statins   Review of Systems Review of Systems  Unable to perform ROS: Dementia     Physical Exam Updated Vital Signs BP (!) 149/71 (BP Location: Right Arm)   Pulse 76   Temp 98 F (36.7 C) (Axillary)   Resp 18   SpO2 100%   Physical Exam HENT:     Head: Atraumatic.     Mouth/Throat:     Mouth: Mucous membranes are moist.  Eyes:     General: No scleral icterus. Cardiovascular:     Rate and Rhythm: Regular rhythm.  Pulmonary:     Comments: Diffuse harsh breath sounds. Abdominal:     Tenderness: There is abdominal tenderness.     Comments: Tenderness in the lower abdomen.  No hernia palpated.  Musculoskeletal:     Right lower leg: No edema.     Left lower leg: No edema.  Skin:    General: Skin is warm.  Neurological:     Mental Status: He is alert.     Comments: Awake but some confusion.      ED  Treatments / Results  Labs (all labs ordered are listed, but only abnormal results are displayed) Labs Reviewed  CBC WITH DIFFERENTIAL/PLATELET - Abnormal; Notable for the following components:      Result Value   Platelets 132 (*)    All other components within normal limits  COMPREHENSIVE METABOLIC PANEL - Abnormal; Notable for the following components:   Glucose, Bld 141 (*)    Total Bilirubin 1.7 (*)    All other components within normal limits  VALPROIC ACID LEVEL - Abnormal; Notable for the following components:   Valproic Acid Lvl <10 (*)    All other components within normal limits  URINALYSIS,  ROUTINE W REFLEX MICROSCOPIC    EKG None  Radiology Dg Chest 2 View  Result Date: 01/12/2019 CLINICAL DATA:  Evaluate for aspiration EXAM: CHEST - 2 VIEW COMPARISON:  11/17/2018 cans 08/28/2018 FINDINGS: Patient has a LEFT-sided transvenous pacemaker with leads to the RIGHT atrium, RIGHT ventricle, and coronary sinus. Status post median sternotomy and valve replacement. Heart is enlarged. There is patchy opacity within the LOWER RIGHT UPPER lobe and MEDIAL RIGHT LOWER lobe, associated with small pleural effusion. IMPRESSION: 1. Cardiomegaly without pulmonary edema. 2. RIGHT LOWER lobe and RIGHT LOWER lobe infiltrates. Followup PA and lateral chest X-ray is recommended in 3-4 weeks following trial of antibiotic therapy to ensure resolution and exclude underlying malignancy. Electronically Signed   By: Nolon Nations M.D.   On: 01/12/2019 20:51   Ct Abdomen Pelvis W Contrast  Result Date: 01/12/2019 CLINICAL DATA:  Projectile vomiting. Acute abdominal pain generalized. EXAM: CT ABDOMEN AND PELVIS WITH CONTRAST TECHNIQUE: Multidetector CT imaging of the abdomen and pelvis was performed using the standard protocol following bolus administration of intravenous contrast. CONTRAST:  167mL OMNIPAQUE IOHEXOL 300 MG/ML  SOLN COMPARISON:  08/28/2018 FINDINGS: Lower chest: Consolidation in both lung  bases, greater on the right, and progressing since previous study. This may represent pneumonia. Cardiac enlargement. Postoperative changes in the mediastinum. Hepatobiliary: No focal liver abnormality is seen. No gallstones, gallbladder wall thickening, or biliary dilatation. Pancreas: Unremarkable. No pancreatic ductal dilatation or surrounding inflammatory changes. Spleen: Normal in size without focal abnormality. Adrenals/Urinary Tract: Adrenal glands are unremarkable. Kidneys are normal, without renal calculi, focal lesion, or hydronephrosis. Bladder is decompressed. Stomach/Bowel: Stomach, small bowel, and colon are not abnormally distended. No wall thickening or inflammatory changes are appreciated. Appendix is normal. Vascular/Lymphatic: Aortic atherosclerosis. No enlarged abdominal or pelvic lymph nodes. Reproductive: Prostate gland is enlarged, measuring 5.7 cm diameter. Other: No free air or free fluid in the abdomen. Postoperative changes consistent with inguinal hernia repairs. Musculoskeletal: Degenerative changes in the spine and hips. IMPRESSION: 1. No acute process demonstrated in the abdomen or pelvis. No evidence of bowel obstruction or inflammation. 2. Consolidation in both lung bases suggesting pneumonia. 3. Enlarged prostate gland. Electronically Signed   By: Lucienne Capers M.D.   On: 01/12/2019 23:23    Procedures Procedures (including critical care time)  Medications Ordered in ED Medications  azithromycin (ZITHROMAX) 500 mg in sodium chloride 0.9 % 250 mL IVPB (500 mg Intravenous New Bag/Given 01/12/19 2243)  ondansetron (ZOFRAN) injection 4 mg (4 mg Intravenous Given 01/12/19 2150)  cefTRIAXone (ROCEPHIN) 1 g in sodium chloride 0.9 % 100 mL IVPB (1 g Intravenous New Bag/Given 01/12/19 2244)  iohexol (OMNIPAQUE) 300 MG/ML solution 100 mL (100 mLs Intravenous Contrast Given 01/12/19 2303)     Initial Impression / Assessment and Plan / ED Course  I have reviewed the triage vital  signs and the nursing notes.  Pertinent labs & imaging results that were available during my care of the patient were reviewed by me and considered in my medical decision making (see chart for details).     Patient with nausea vomiting.  Hypoxic on room air.  Does however do better on nasal cannula.  X-ray showed possible pneumonia.  CT scan of his abdomen done due to abdominal pain and vomiting.  It was reassuring but did show pneumonia also.  Will admit to hospitalist. Final Clinical Impressions(s) / ED Diagnoses   Final diagnoses:  Community acquired pneumonia, unspecified laterality  Hypoxia  Non-intractable vomiting with nausea, unspecified vomiting  type    ED Discharge Orders    None       Davonna Belling, MD 01/12/19 2334

## 2019-01-13 ENCOUNTER — Encounter (HOSPITAL_COMMUNITY): Payer: Self-pay | Admitting: *Deleted

## 2019-01-13 ENCOUNTER — Other Ambulatory Visit: Payer: Self-pay

## 2019-01-13 DIAGNOSIS — I252 Old myocardial infarction: Secondary | ICD-10-CM | POA: Diagnosis not present

## 2019-01-13 DIAGNOSIS — I2510 Atherosclerotic heart disease of native coronary artery without angina pectoris: Secondary | ICD-10-CM | POA: Diagnosis present

## 2019-01-13 DIAGNOSIS — J181 Lobar pneumonia, unspecified organism: Secondary | ICD-10-CM | POA: Diagnosis not present

## 2019-01-13 DIAGNOSIS — R112 Nausea with vomiting, unspecified: Secondary | ICD-10-CM | POA: Diagnosis present

## 2019-01-13 DIAGNOSIS — Z950 Presence of cardiac pacemaker: Secondary | ICD-10-CM | POA: Diagnosis not present

## 2019-01-13 DIAGNOSIS — E785 Hyperlipidemia, unspecified: Secondary | ICD-10-CM | POA: Diagnosis present

## 2019-01-13 DIAGNOSIS — F0150 Vascular dementia without behavioral disturbance: Secondary | ICD-10-CM

## 2019-01-13 DIAGNOSIS — N400 Enlarged prostate without lower urinary tract symptoms: Secondary | ICD-10-CM | POA: Diagnosis present

## 2019-01-13 DIAGNOSIS — F0390 Unspecified dementia without behavioral disturbance: Secondary | ICD-10-CM | POA: Diagnosis present

## 2019-01-13 DIAGNOSIS — R0902 Hypoxemia: Secondary | ICD-10-CM

## 2019-01-13 DIAGNOSIS — Z951 Presence of aortocoronary bypass graft: Secondary | ICD-10-CM | POA: Diagnosis not present

## 2019-01-13 DIAGNOSIS — J9601 Acute respiratory failure with hypoxia: Secondary | ICD-10-CM

## 2019-01-13 DIAGNOSIS — I10 Essential (primary) hypertension: Secondary | ICD-10-CM

## 2019-01-13 DIAGNOSIS — R627 Adult failure to thrive: Secondary | ICD-10-CM | POA: Diagnosis present

## 2019-01-13 DIAGNOSIS — J690 Pneumonitis due to inhalation of food and vomit: Secondary | ICD-10-CM | POA: Diagnosis present

## 2019-01-13 DIAGNOSIS — I442 Atrioventricular block, complete: Secondary | ICD-10-CM

## 2019-01-13 DIAGNOSIS — I4819 Other persistent atrial fibrillation: Secondary | ICD-10-CM | POA: Diagnosis not present

## 2019-01-13 DIAGNOSIS — Z66 Do not resuscitate: Secondary | ICD-10-CM | POA: Diagnosis present

## 2019-01-13 DIAGNOSIS — Z833 Family history of diabetes mellitus: Secondary | ICD-10-CM | POA: Diagnosis not present

## 2019-01-13 DIAGNOSIS — I509 Heart failure, unspecified: Secondary | ICD-10-CM | POA: Diagnosis present

## 2019-01-13 DIAGNOSIS — I4891 Unspecified atrial fibrillation: Secondary | ICD-10-CM

## 2019-01-13 DIAGNOSIS — J189 Pneumonia, unspecified organism: Secondary | ICD-10-CM | POA: Diagnosis not present

## 2019-01-13 DIAGNOSIS — I110 Hypertensive heart disease with heart failure: Secondary | ICD-10-CM | POA: Diagnosis present

## 2019-01-13 DIAGNOSIS — Z888 Allergy status to other drugs, medicaments and biological substances status: Secondary | ICD-10-CM | POA: Diagnosis not present

## 2019-01-13 DIAGNOSIS — Z7901 Long term (current) use of anticoagulants: Secondary | ICD-10-CM | POA: Diagnosis not present

## 2019-01-13 DIAGNOSIS — Z79899 Other long term (current) drug therapy: Secondary | ICD-10-CM | POA: Diagnosis not present

## 2019-01-13 LAB — URINALYSIS, ROUTINE W REFLEX MICROSCOPIC
Bilirubin Urine: NEGATIVE
GLUCOSE, UA: NEGATIVE mg/dL
Ketones, ur: NEGATIVE mg/dL
Leukocytes, UA: NEGATIVE
Nitrite: NEGATIVE
Protein, ur: NEGATIVE mg/dL
Specific Gravity, Urine: >1.046 — ABNORMAL HIGH (ref 1.005–1.030)
pH: 5 (ref 5.0–8.0)

## 2019-01-13 LAB — CBC
HEMATOCRIT: 46.4 % (ref 39.0–52.0)
Hemoglobin: 15.1 g/dL (ref 13.0–17.0)
MCH: 32.3 pg (ref 26.0–34.0)
MCHC: 32.5 g/dL (ref 30.0–36.0)
MCV: 99.1 fL (ref 80.0–100.0)
Platelets: 134 10*3/uL — ABNORMAL LOW (ref 150–400)
RBC: 4.68 MIL/uL (ref 4.22–5.81)
RDW: 12.7 % (ref 11.5–15.5)
WBC: 7.4 10*3/uL (ref 4.0–10.5)
nRBC: 0 % (ref 0.0–0.2)

## 2019-01-13 LAB — BASIC METABOLIC PANEL
Anion gap: 9 (ref 5–15)
BUN: 20 mg/dL (ref 8–23)
CHLORIDE: 106 mmol/L (ref 98–111)
CO2: 26 mmol/L (ref 22–32)
Calcium: 9.6 mg/dL (ref 8.9–10.3)
Creatinine, Ser: 0.99 mg/dL (ref 0.61–1.24)
GFR calc Af Amer: >60 mL/min (ref >60)
GFR calc non Af Amer: >60 mL/min (ref >60)
Glucose, Bld: 148 mg/dL — ABNORMAL HIGH (ref 70–99)
POTASSIUM: 4.7 mmol/L (ref 3.5–5.1)
Sodium: 141 mmol/L (ref 135–145)

## 2019-01-13 LAB — RESPIRATORY PANEL BY PCR
ADENOVIRUS-RVPPCR: NOT DETECTED
Bordetella pertussis: NOT DETECTED
Chlamydophila pneumoniae: NOT DETECTED
Coronavirus 229E: NOT DETECTED
Coronavirus HKU1: NOT DETECTED
Coronavirus NL63: NOT DETECTED
Coronavirus OC43: NOT DETECTED
Influenza A: NOT DETECTED
Influenza B: NOT DETECTED
MYCOPLASMA PNEUMONIAE-RVPPCR: NOT DETECTED
Metapneumovirus: NOT DETECTED
Parainfluenza Virus 1: NOT DETECTED
Parainfluenza Virus 2: NOT DETECTED
Parainfluenza Virus 3: NOT DETECTED
Parainfluenza Virus 4: NOT DETECTED
Respiratory Syncytial Virus: NOT DETECTED
Rhinovirus / Enterovirus: NOT DETECTED

## 2019-01-13 LAB — INFLUENZA PANEL BY PCR (TYPE A & B)
Influenza A By PCR: NEGATIVE
Influenza B By PCR: NEGATIVE

## 2019-01-13 LAB — MRSA PCR SCREENING: MRSA by PCR: NEGATIVE

## 2019-01-13 LAB — HIV ANTIBODY (ROUTINE TESTING W REFLEX): HIV Screen 4th Generation wRfx: NONREACTIVE

## 2019-01-13 LAB — STREP PNEUMONIAE URINARY ANTIGEN: Strep Pneumo Urinary Antigen: NEGATIVE

## 2019-01-13 MED ORDER — DOCUSATE SODIUM 100 MG PO CAPS: 100.0000 mg | ORAL_CAPSULE | Freq: Two times a day (BID) | ORAL | Status: DC

## 2019-01-13 MED ORDER — SODIUM CHLORIDE 0.9 % IV SOLN
500.0000 mg | INTRAVENOUS | Status: DC
  Administered 2019-01-13: 500 mg via INTRAVENOUS
  Filled 2019-01-13 (×2): qty 500

## 2019-01-13 MED ORDER — SODIUM CHLORIDE 0.9 % IV SOLN
INTRAVENOUS | Status: DC
  Administered 2019-01-13: 03:00:00 via INTRAVENOUS

## 2019-01-13 MED ORDER — CARVEDILOL 3.125 MG PO TABS
3.1250 mg | ORAL_TABLET | Freq: Two times a day (BID) | ORAL | Status: DC
  Administered 2019-01-14 (×2): 3.125 mg via ORAL
  Filled 2019-01-13 (×5): qty 1

## 2019-01-13 MED ORDER — SODIUM CHLORIDE 0.9 % IV SOLN
1.0000 g | INTRAVENOUS | Status: DC
  Administered 2019-01-13: 1 g via INTRAVENOUS
  Filled 2019-01-13 (×2): qty 10

## 2019-01-13 MED ORDER — BENZOCAINE-MENTHOL 6-10 MG MT LOZG: 1.0000 | LOZENGE | Freq: Four times a day (QID) | OROMUCOSAL | Status: DC | PRN

## 2019-01-13 MED ORDER — APIXABAN 5 MG PO TABS
5.0000 mg | ORAL_TABLET | Freq: Two times a day (BID) | ORAL | Status: DC
  Administered 2019-01-13 – 2019-01-14 (×3): 5 mg via ORAL
  Filled 2019-01-13 (×4): qty 1

## 2019-01-13 MED ORDER — MIRTAZAPINE 15 MG PO TABS
15.0000 mg | ORAL_TABLET | Freq: Every day | ORAL | Status: DC
  Administered 2019-01-13: 15 mg via ORAL
  Filled 2019-01-13: qty 1

## 2019-01-13 MED ORDER — ONDANSETRON HCL 4 MG/2ML IJ SOLN: 4.0000 mg | Freq: Four times a day (QID) | INTRAMUSCULAR | Status: DC | PRN

## 2019-01-13 MED ORDER — FINASTERIDE 5 MG PO TABS
5.0000 mg | ORAL_TABLET | Freq: Every day | ORAL | Status: DC
  Administered 2019-01-13 – 2019-01-14 (×2): 5 mg via ORAL
  Filled 2019-01-13 (×3): qty 1

## 2019-01-13 MED ORDER — BRIMONIDINE TARTRATE 0.2 % OP SOLN
1.0000 [drp] | Freq: Three times a day (TID) | OPHTHALMIC | Status: DC
  Administered 2019-01-13 – 2019-01-14 (×4): 1 [drp] via OPHTHALMIC
  Filled 2019-01-13: qty 5

## 2019-01-13 MED ORDER — DIVALPROEX SODIUM 125 MG PO DR TAB
125.0000 mg | DELAYED_RELEASE_TABLET | Freq: Two times a day (BID) | ORAL | Status: DC
  Administered 2019-01-13 – 2019-01-14 (×2): 125 mg via ORAL
  Filled 2019-01-13 (×5): qty 1

## 2019-01-13 MED ORDER — DOCUSATE SODIUM 100 MG PO CAPS: 100.0000 mg | ORAL_CAPSULE | Freq: Two times a day (BID) | ORAL | Status: DC | PRN

## 2019-01-13 MED ORDER — TRAZODONE 25 MG HALF TABLET
25.0000 mg | ORAL_TABLET | Freq: Every day | ORAL | Status: DC
  Administered 2019-01-13: 25 mg via ORAL
  Filled 2019-01-13 (×2): qty 1

## 2019-01-13 MED ORDER — EZETIMIBE 10 MG PO TABS
10.0000 mg | ORAL_TABLET | Freq: Every day | ORAL | Status: DC
  Administered 2019-01-13: 10 mg via ORAL
  Filled 2019-01-13: qty 1

## 2019-01-13 MED ORDER — LORAZEPAM 0.5 MG PO TABS: 0.5000 mg | ORAL_TABLET | Freq: Two times a day (BID) | ORAL | Status: DC | PRN

## 2019-01-13 MED ORDER — ACETAMINOPHEN 325 MG PO TABS
650.0000 mg | ORAL_TABLET | ORAL | Status: DC | PRN
  Filled 2019-01-13: qty 2

## 2019-01-13 MED ORDER — TAMSULOSIN HCL 0.4 MG PO CAPS
0.4000 mg | ORAL_CAPSULE | Freq: Every day | ORAL | Status: DC
  Administered 2019-01-13: 0.4 mg via ORAL
  Filled 2019-01-13: qty 1

## 2019-01-13 MED ORDER — DONEPEZIL HCL 10 MG PO TABS
10.0000 mg | ORAL_TABLET | Freq: Every day | ORAL | Status: DC
  Administered 2019-01-13: 10 mg via ORAL
  Filled 2019-01-13: qty 1

## 2019-01-13 MED ORDER — ORAL CARE MOUTH RINSE
15.0000 mL | Freq: Two times a day (BID) | OROMUCOSAL | Status: DC
  Administered 2019-01-13: 15 mL via OROMUCOSAL

## 2019-01-13 NOTE — Clinical Social Work Note (Signed)
Clinical Social Work Assessment  Patient Details  Name: Grant Lawrence MRN: [id number] Date of Birth: 03-07-1931  Date of referral:  01/13/19               Reason for consult:  Facility Placement, Discharge Planning                Permission sought to share information with:  Family Supports Permission granted to share information::  No(Patient oriented to self only)  Name::        Agency::     Relationship::     Contact Information:     Housing/Transportation Living arrangements for the past 2 months:  Assisted Living Facility([facility] ) Source of Information:  Adult Children(Daughter Johnn Hai) Patient Interpreter Needed:  None Criminal Activity/Legal Involvement Pertinent to Current Situation/Hospitalization:  No - Comment as needed Significant Relationships:  Adult Children, Spouse Lives with:  Spouse Do you feel safe going back to the place where you live?  Yes Need for family participation in patient care:  Yes (Comment)  Care giving concerns:  CSW talked with daughter Grant Lawrence at bedside and she expressed no major concerns regarding patient's care at ALF. She reported that the main issue is understaffing.    Social Worker assessment / plan: CSW visited room and talked with patient and daughter at the bedside regarding Mr. Grant Lawrence's discharge disposition. Mr. Grant Lawrence was sitting up in bed and was pleasant, greeted CSW and allowed his daughter to engage with CSW. Daughter Grant Lawrence advised CSW that her dad is hard-of-hearing.  Grant Lawrence confirmed that her dad and mom live [facility] on [place] and moved in before Christmas 2018. Per daughter, the d/c plan is back to [place] by ambulance.  Employment status:  Retired Research officer, political party ) PT Recommendations:  Not assessed at this time Information / Referral to community resources:  Other (Comment Required)(None needed or requested as this time as patient from  ALF)  Patient/Family's Response to care: No concerns expressed by daughter regarding patient's care during hospitalization.  Patient/Family's Understanding of and Emotional Response to Diagnosis, Current Treatment, and Prognosis:  Daughter appeared accepting of patient's hospitalization and did not express any concerns.  Emotional Assessment Appearance:  Appears stated age Attitude/Demeanor/Rapport:  Other(Quiet) Affect (typically observed):  Appropriate Orientation:  Oriented to Self Alcohol / Substance use:  Tobacco Use, Alcohol Use, Illicit Drugs(Patient reported that he has never smoked and does not drink or use illicit drugs) Psych involvement (Current and /or in the community):  No (Comment)  Discharge Needs  Concerns to be addressed:  Discharge Planning Concerns Readmission within the last 30 days:  No Current discharge risk:  None Barriers to Discharge:  Continued Medical Work up   [employer] Mila Homer, [place] 01/13/2019, 12:11 PM

## 2019-01-13 NOTE — Progress Notes (Signed)
PT Cancellation Note  Patient Details Name: Grant Lawrence MRN: [id number] DOB: [birth year]   Cancelled Treatment:    Reason Eval/Treat Not Completed: Patient declined, no reason specified. Patient refusing mobility at this time and becoming agitated despite max encouragement. [name] requesting to try again tomorrow. Will follow up as schedule allows.  Erick Blinks, SPT  Erick Blinks 01/13/2019, 1:44 PM

## 2019-01-13 NOTE — H&P (Signed)
History and Physical    Grant Lawrence AJO:[id number] DOB: May 29, 1931 DOA: 01/12/2019  PCP: Marletta Lor, MD  Patient coming from: ALF  I have personally briefly reviewed patient's old medical records in [place]  Chief Complaint: Emesis  HPI: Grant Lawrence is a 85 y.o. male with medical history significant of A.Fib on eliquis, dementia, PPM s/p ablation of AV node.  Patient presents to the ED with reported episodes of vomiting since 1500 today.  Concern for aspiration, patient is not normally O2 dependent but was satting 85% on RA when EMS was called to ALF.   ED Course: CXR shows B basilar infiltrates.  CT abd/pelvis is neg for any acute intra-abdominal pathology but also shows B basilar infiltrates suspicious for PNA.  Vomiting controlled.  Patient denies any abd pain at this time and is resting comfortably.  Started on Rocephin / azithro.   Review of Systems: As per HPI otherwise 10 point review of systems negative.   Past Medical History:  Diagnosis Date  . A-fib ([place])   . Angina pectoris ([place])   . Asymptomatic microscopic hematuria   . Cardiac defibrillator in place   . Cardiac pacemaker   . CHF (congestive heart failure) ([place])   . Coronary artery disease   . Dementia ([place])   . Hyperlipidemia   . Hypertension   . Long term (current) use of anticoagulants     Past Surgical History:  Procedure Laterality Date  . CARDIAC DEFIBRILLATOR PLACEMENT  09/26/2009  . CORONARY ARTERY BYPASS GRAFT  2005  . CORONARY ARTERY BYPASS GRAFT    . IMPLANTABLE CARDIOVERTER DEFIBRILLATOR (ICD) GENERATOR CHANGE N/A 08/23/2014   Procedure: ICD GENERATOR CHANGE;  Surgeon: Sanda Klein, MD;  Location: [facility] CATH LAB;  Service: Cardiovascular;  Laterality: N/A;  . MITRAL VALVE REPAIR (MV)/CORONARY ARTERY BYPASS GRAFTING (CABG)    . MITRAL VALVULOPLASTY  2005     reports that he has never smoked. He has never used smokeless tobacco. He reports that he does not drink  alcohol or use drugs.  Allergies  Allergen Reactions  . Rosuvastatin Calcium Other (See Comments)    Myalgia  . Statins Other (See Comments)    Myalgia    Family History  Problem Relation Age of Onset  . Cancer Father   . Diabetes Brother      Prior to Admission medications   Medication Sig Start Date End Date Taking? Authorizing Provider  acetaminophen (TYLENOL) 325 MG tablet Take 650 mg by mouth every 4 (four) hours as needed (pain). Do not exceed 3g in 24 hours   Yes [provider]  apixaban (ELIQUIS) 5 MG TABS tablet Take 5 mg by mouth 2 (two) times daily.   Yes [provider]  Benzocaine-Menthol (CHLORASEPTIC SORE THROAT MT) Use as directed 2 sprays in the mouth or throat every 6 (six) hours as needed (sore throat).   Yes [provider]  brimonidine (ALPHAGAN) 0.2 % ophthalmic solution Place 1 drop into both eyes 3 (three) times daily. 09/09/17  Yes Marletta Lor, MD  carvedilol (COREG) 3.125 MG tablet TAKE ONE TABLET BY MOUTH TWICE DAILY WITH MEALS Patient taking differently: Take 3.125 mg by mouth 2 (two) times daily.  08/25/17  Yes Marletta Lor, MD  divalproex (DEPAKOTE) 125 MG DR tablet Take 125 mg by mouth 2 (two) times daily.    Yes [provider]  docusate sodium (COLACE) 100 MG capsule Take 100 mg by mouth 2 (two) times daily.  Yes [provider]  donepezil (ARICEPT) 10 MG tablet Take 10 mg by mouth at bedtime.  07/08/18  Yes [provider]  ezetimibe (ZETIA) 10 MG tablet Take 1 tablet (10 mg total) daily by mouth. Patient taking differently: Take 10 mg by mouth at bedtime.  10/24/17  Yes Marletta Lor, MD  finasteride (PROSCAR) 5 MG tablet Take 5 mg by mouth daily.   Yes Festus Aloe, MD  LORazepam (ATIVAN) 0.5 MG tablet Take 0.5 mg by mouth every 12 (twelve) hours as needed for anxiety.   Yes [provider]  Menthol-Zinc Oxide (CALMOSEPTINE) 0.44-20.6 % OINT Apply 1  application topically daily as needed (redness).   Yes [provider]  mirtazapine (REMERON) 15 MG tablet Take 15 mg by mouth at bedtime.    Yes [provider]  NUTRITIONAL SUPPLEMENT LIQD Take 120 mLs by mouth 3 (three) times daily. House supplement   Yes [provider]  nystatin (NYSTATIN) powder Apply 1 g topically every 6 (six) hours as needed (rash).    Yes [provider]  tamsulosin (FLOMAX) 0.4 MG CAPS capsule Take 0.4 mg by mouth at bedtime.  03/28/17  Yes Festus Aloe, MD  traZODone (DESYREL) 50 MG tablet Take 12.5 mg by mouth at bedtime.   Yes [provider]    Physical Exam: Vitals:   01/13/19 0100 01/13/19 0130 01/13/19 0200 01/13/19 0230  BP: 131/60 140/60 122/81 134/75  Pulse: 70 70 72 78  Resp: 20 20 18 17   Temp:      TempSrc:      SpO2: 100% 100% 100% 100%    Constitutional: NAD, calm, comfortable Eyes: PERRL, lids and conjunctivae normal ENMT: Mucous membranes are moist. Posterior pharynx clear of any exudate or lesions.Normal dentition.  Neck: normal, supple, no masses, no thyromegaly Respiratory: Coarse breath sounds bilaterally Cardiovascular: Regular rate and rhythm, no murmurs / rubs / gallops. No extremity edema. 2+ pedal pulses. No carotid bruits.  Abdomen: no tenderness, no masses palpated. No hepatosplenomegaly. Bowel sounds positive.  Musculoskeletal: no clubbing / cyanosis. No joint deformity upper and lower extremities. Good ROM, no contractures. Normal muscle tone.  Skin: no rashes, lesions, ulcers. No induration Neurologic: CN 2-12 grossly intact. Sensation intact, DTR normal. Strength 5/5 in all 4.  Psychiatric: Mild confusion   Labs on Admission: I have personally reviewed following labs and imaging studies  CBC: Recent Labs  Lab 01/12/19 1949  WBC 6.2  NEUTROABS 5.1  HGB 14.5  HCT 44.1  MCV 99.5  PLT 244*   Basic Metabolic Panel: Recent Labs  Lab 01/12/19 2155  NA 142  K 4.9  CL  107  CO2 22  GLUCOSE 141*  BUN 23  CREATININE 0.96  CALCIUM 9.9   GFR: CrCl cannot be calculated (Unknown ideal weight.). Liver Function Tests: Recent Labs  Lab 01/12/19 2155  AST 28  ALT 18  ALKPHOS 72  BILITOT 1.7*  PROT 7.2  ALBUMIN 4.1   No results for input(s): LIPASE, AMYLASE in the last 168 hours. No results for input(s): AMMONIA in the last 168 hours. Coagulation Profile: No results for input(s): INR, PROTIME in the last 168 hours. Cardiac Enzymes: No results for input(s): CKTOTAL, CKMB, CKMBINDEX, TROPONINI in the last 168 hours. BNP (last 3 results) No results for input(s): PROBNP in the last 8760 hours. HbA1C: No results for input(s): HGBA1C in the last 72 hours. CBG: No results for input(s): GLUCAP in the last 168 hours. Lipid Profile: No results for  input(s): CHOL, HDL, LDLCALC, TRIG, CHOLHDL, LDLDIRECT in the last 72 hours. Thyroid Function Tests: No results for input(s): TSH, T4TOTAL, FREET4, T3FREE, THYROIDAB in the last 72 hours. Anemia Panel: No results for input(s): VITAMINB12, FOLATE, FERRITIN, TIBC, IRON, RETICCTPCT in the last 72 hours. Urine analysis:    Component Value Date/Time   COLORURINE YELLOW 11/17/2018 1537   APPEARANCEUR CLEAR 11/17/2018 1537   LABSPEC 1.015 11/17/2018 1537   PHURINE 6.5 11/17/2018 1537   GLUCOSEU NEGATIVE 11/17/2018 1537   HGBUR NEGATIVE 11/17/2018 1537   BILIRUBINUR NEGATIVE 11/17/2018 1537   BILIRUBINUR n 02/27/2017 1400   KETONESUR NEGATIVE 11/17/2018 1537   PROTEINUR NEGATIVE 11/17/2018 1537   UROBILINOGEN 0.2 02/27/2017 1400   NITRITE NEGATIVE 11/17/2018 1537   LEUKOCYTESUR TRACE (A) 11/17/2018 1537    Radiological Exams on Admission: Dg Chest 2 View  Result Date: 01/12/2019 CLINICAL DATA:  Evaluate for aspiration EXAM: CHEST - 2 VIEW COMPARISON:  11/17/2018 cans 08/28/2018 FINDINGS: Patient has a LEFT-sided transvenous pacemaker with leads to the RIGHT atrium, RIGHT ventricle, and coronary sinus. Status  post median sternotomy and valve replacement. Heart is enlarged. There is patchy opacity within the LOWER RIGHT UPPER lobe and MEDIAL RIGHT LOWER lobe, associated with small pleural effusion. IMPRESSION: 1. Cardiomegaly without pulmonary edema. 2. RIGHT LOWER lobe and RIGHT LOWER lobe infiltrates. Followup PA and lateral chest X-ray is recommended in 3-4 weeks following trial of antibiotic therapy to ensure resolution and exclude underlying malignancy. Electronically Signed   By: Nolon Nations M.D.   On: 01/12/2019 20:51   Ct Abdomen Pelvis W Contrast  Result Date: 01/12/2019 CLINICAL DATA:  Projectile vomiting. Acute abdominal pain generalized. EXAM: CT ABDOMEN AND PELVIS WITH CONTRAST TECHNIQUE: Multidetector CT imaging of the abdomen and pelvis was performed using the standard protocol following bolus administration of intravenous contrast. CONTRAST:  156mL OMNIPAQUE IOHEXOL 300 MG/ML  SOLN COMPARISON:  08/28/2018 FINDINGS: Lower chest: Consolidation in both lung bases, greater on the right, and progressing since previous study. This may represent pneumonia. Cardiac enlargement. Postoperative changes in the mediastinum. Hepatobiliary: No focal liver abnormality is seen. No gallstones, gallbladder wall thickening, or biliary dilatation. Pancreas: Unremarkable. No pancreatic ductal dilatation or surrounding inflammatory changes. Spleen: Normal in size without focal abnormality. Adrenals/Urinary Tract: Adrenal glands are unremarkable. Kidneys are normal, without renal calculi, focal lesion, or hydronephrosis. Bladder is decompressed. Stomach/Bowel: Stomach, small bowel, and colon are not abnormally distended. No wall thickening or inflammatory changes are appreciated. Appendix is normal. Vascular/Lymphatic: Aortic atherosclerosis. No enlarged abdominal or pelvic lymph nodes. Reproductive: Prostate gland is enlarged, measuring 5.7 cm diameter. Other: No free air or free fluid in the abdomen. Postoperative  changes consistent with inguinal hernia repairs. Musculoskeletal: Degenerative changes in the spine and hips. IMPRESSION: 1. No acute process demonstrated in the abdomen or pelvis. No evidence of bowel obstruction or inflammation. 2. Consolidation in both lung bases suggesting pneumonia. 3. Enlarged prostate gland. Electronically Signed   By: Lucienne Capers M.D.   On: 01/12/2019 23:23    EKG: Independently reviewed.  Assessment/Plan Principal Problem:   Community acquired pneumonia of both lower lobes ([place]) Active Problems:   A-fib (HCC)   HTN (hypertension)   CHB (complete heart block) s/p AV node ablation   Dementia (HCC)   Nausea and vomiting    1. CAP of B lower lobes - vs aspiration PNA 1. PNA pathway 2. Rocephin / azithro 3. sating okay on [place], but new O2 requirement 4. Cultures pending 5. Influenza PCR negative 2.  N/V - 1. Controlled with zofran 2. CT abd/pelvis neg for acute pathology 3. No abd pain or worrisome exam findings 3. A.Fib - 1. Continue eliquis 2. Rate controlled s/p ablation of AV node (has PPM) 4. Dementia - chronic and stable 5. HTN - continue home BP meds  DVT prophylaxis: Eliquis Code Status: DNR - yellow form at bedside Family Communication: No family in room Disposition Plan: ALF after admit Consults called: None Admission status: Admit to inpatient  Severity of Illness: The appropriate patient status for this patient is INPATIENT. Inpatient status is judged to be reasonable and necessary in order to provide the required intensity of service to ensure the patient's safety. The patient's presenting symptoms, physical exam findings, and initial radiographic and laboratory data in the context of their chronic comorbidities is felt to place them at high risk for further clinical deterioration. Furthermore, it is not anticipated that the patient will be medically stable for discharge from the hospital within 2 midnights of admission. The following  factors support the patient status of inpatient.   " The patient's presenting symptoms include N/V, cough, hypoxia. " The worrisome physical exam findings include New O2 requirement. " The initial radiographic and laboratory data are worrisome because of B PNA on imaging. " The chronic co-morbidities include Dementia.   * I certify that at the point of admission it is my clinical judgment that the patient will require inpatient hospital care spanning beyond 2 midnights from the point of admission due to high intensity of service, high risk for further deterioration and high frequency of surveillance required.*    ,  M. DO [facility]  How to contact the [facility] Attending or Consulting provider [place] or covering provider during after hours [place], for this patient?  1. Check the care team in [facility] and look for a) attending/consulting TRH provider listed and b) the [facility] team listed 2. Log into [email or web address]  Amion Physician Scheduling and messaging for groups and whole hospitals  On call and physician scheduling software for group practices, residents, hospitalists and other medical providers for call, clinic, rotation and shift schedules. OnCall Enterprise is a hospital-wide system for scheduling doctors and paging doctors on call. EasyPlot is for scientific plotting and data analysis.  [email or web address]  and use [place]'s universal password to access. If you do not have the password, please contact the hospital operator.  3. Locate the [facility] provider you are looking for under [facility] and page to a number that you can be directly reached. 4. If you still have difficulty reaching the provider, please page the [facility] (Director on Call) for the Hospitalists listed on amion for assistance.  01/13/2019, 2:50 AM

## 2019-01-13 NOTE — Progress Notes (Signed)
Patient is admitted this am for pneumonia. No vomiting since in the hospital. Patient has advanced dementia, family aware of possibility of delirium. Agree with sitter.

## 2019-01-13 NOTE — Progress Notes (Signed)
Patient is agitated,refused to have all his medicines at this time.

## 2019-01-14 DIAGNOSIS — R627 Adult failure to thrive: Secondary | ICD-10-CM

## 2019-01-14 DIAGNOSIS — J690 Pneumonitis due to inhalation of food and vomit: Principal | ICD-10-CM

## 2019-01-14 DIAGNOSIS — I4819 Other persistent atrial fibrillation: Secondary | ICD-10-CM

## 2019-01-14 LAB — CBC WITH DIFFERENTIAL/PLATELET
Abs Immature Granulocytes: 0.02 10*3/uL (ref 0.00–0.07)
BASOS ABS: 0 10*3/uL (ref 0.0–0.1)
Basophils Relative: 1 %
Eosinophils Absolute: 0 10*3/uL (ref 0.0–0.5)
Eosinophils Relative: 0 %
HCT: 38.8 % — ABNORMAL LOW (ref 39.0–52.0)
Hemoglobin: 12.7 g/dL — ABNORMAL LOW (ref 13.0–17.0)
Immature Granulocytes: 0 %
LYMPHS PCT: 15 %
Lymphs Abs: 0.8 10*3/uL (ref 0.7–4.0)
MCH: 32.8 pg (ref 26.0–34.0)
MCHC: 32.7 g/dL (ref 30.0–36.0)
MCV: 100.3 fL — ABNORMAL HIGH (ref 80.0–100.0)
Monocytes Absolute: 0.4 10*3/uL (ref 0.1–1.0)
Monocytes Relative: 8 %
NRBC: 0 % (ref 0.0–0.2)
Neutro Abs: 4.2 10*3/uL (ref 1.7–7.7)
Neutrophils Relative %: 76 %
Platelets: 103 10*3/uL — ABNORMAL LOW (ref 150–400)
RBC: 3.87 MIL/uL — ABNORMAL LOW (ref 4.22–5.81)
RDW: 12.8 % (ref 11.5–15.5)
WBC: 5.5 10*3/uL (ref 4.0–10.5)

## 2019-01-14 LAB — BASIC METABOLIC PANEL
Anion gap: 11 (ref 5–15)
BUN: 17 mg/dL (ref 8–23)
CO2: 26 mmol/L (ref 22–32)
CREATININE: 0.87 mg/dL (ref 0.61–1.24)
Calcium: 9.4 mg/dL (ref 8.9–10.3)
Chloride: 105 mmol/L (ref 98–111)
GFR calc non Af Amer: >60 mL/min (ref >60)
Glucose, Bld: 102 mg/dL — ABNORMAL HIGH (ref 70–99)
Potassium: 4.2 mmol/L (ref 3.5–5.1)
Sodium: 142 mmol/L (ref 135–145)

## 2019-01-14 LAB — MAGNESIUM: Magnesium: 1.8 mg/dL (ref 1.7–2.4)

## 2019-01-14 MED ORDER — PROBIOTIC 250 MG PO CAPS: 30.0000 | ORAL_CAPSULE | Freq: Every day | ORAL | 0 refills | Status: DC

## 2019-01-14 MED ORDER — AMOXICILLIN-POT CLAVULANATE 500-125 MG PO TABS: 1.0000 | ORAL_TABLET | Freq: Two times a day (BID) | ORAL | 0 refills | Status: DC

## 2019-01-14 MED ORDER — AMOXICILLIN-POT CLAVULANATE 500-125 MG PO TABS: 1.0000 | ORAL_TABLET | Freq: Two times a day (BID) | ORAL | 0 refills | Status: AC

## 2019-01-14 NOTE — Discharge Instructions (Signed)
Information on my medicine - ELIQUIS® (apixaban) ° °This medication education was reviewed with me or my healthcare representative as part of my discharge preparation.  You were taking this medication prior to this hospitalization.  ° °Why was Eliquis® prescribed for you? °Eliquis® was prescribed for you to reduce the risk of a blood clot forming that can cause a stroke if you have a medical condition called atrial fibrillation (a type of irregular heartbeat). ° °What do You need to know about Eliquis® ? °Take your Eliquis® TWICE DAILY - one tablet in the morning and one tablet in the evening with or without food. If you have difficulty swallowing the tablet whole please discuss with your pharmacist how to take the medication safely. ° °Take Eliquis® exactly as prescribed by your doctor and DO NOT stop taking Eliquis® without talking to the doctor who prescribed the medication.  Stopping may increase your risk of developing a stroke.  Refill your prescription before you run out. ° °After discharge, you should have regular check-up appointments with your healthcare provider that is prescribing your Eliquis®.  In the future your dose may need to be changed if your kidney function or weight changes by a significant amount or as you get older. ° °What do you do if you miss a dose? °If you miss a dose, take it as soon as you remember on the same day and resume taking twice daily.  Do not take more than one dose of ELIQUIS at the same time to make up a missed dose. ° °Important Safety Information °A possible side effect of Eliquis® is bleeding. You should call your healthcare provider right away if you experience any of the following: °? Bleeding from an injury or your nose that does not stop. °? Unusual colored urine (red or dark brown) or unusual colored stools (red or black). °? Unusual bruising for unknown reasons. °? A serious fall or if you hit your head (even if there is no bleeding). ° °Some medicines may interact  with Eliquis® and might increase your risk of bleeding or clotting while on Eliquis®. To help avoid this, consult your healthcare provider or pharmacist prior to using any new prescription or non-prescription medications, including herbals, vitamins, non-steroidal anti-inflammatory drugs (NSAIDs) and supplements. ° °This website has more information on Eliquis® (apixaban): http://www.eliquis.com/eliquis/home °

## 2019-01-14 NOTE — NC FL2 (Signed)
[place] LEVEL OF CARE SCREENING TOOL     IDENTIFICATION  Patient Name: Grant Lawrence Birthdate: [birth year] Sex: male Admission Date (Current Location): 01/12/2019  [facility] and [place] Number:  Herbalist and Address:  [facility] [address], [place], [place] [address]      Provider Number: [id number]  Attending Physician Name and Address:  Florencia Reasons, MD  Relative Name and Phone Number:  Johnn Hai - daughter; [phone] (h), [phone] (cell)    Current Level of Care: Hospital Recommended Level of Care: Assisted Living Facility([facility]) Prior Approval Number:    Date Approved/Denied:   PASRR Number:    Discharge Plan: Other (Comment)([facility])    Current Diagnoses: Patient Active Problem List   Diagnosis Date Noted  . FTT (failure to thrive) in adult   . Community acquired pneumonia of both lower lobes ([place]) 01/13/2019  . Nausea and vomiting 01/13/2019  . Acute respiratory failure with hypoxia ([place]) 01/13/2019  . Recurrent aspiration pneumonia ([place]) 08/28/2018  . Coronary artery disease involving coronary bypass graft of native heart without angina pectoris 03/06/2018  . Hypercholesterolemia 03/06/2018  . Dementia ([place]) 09/09/2017  . BPH (benign prostatic hyperplasia) 07/22/2016  . Elevated PSA 07/22/2016  . PVCs (premature ventricular contractions) 07/03/2016  . History of repair of mitral valve 07/03/2016  . CAD (coronary artery disease)s/p CABG 2005 08/13/2014  . Mitral valve insufficiency s/p annuloplasty 2005 08/13/2014  . s/p CRT-D St Jude 2010,gen change Sept 2015 08/13/2014  . Elective replacement indicated for implantable cardioverter-defibrillator (ICD) reached July 03, 2014 08/13/2014  . HTN (hypertension) 08/13/2014  . Mixed hyperlipidemia 08/13/2014  . Aortic insufficiency 08/13/2014  . Tricuspid insufficiency 08/13/2014  . Statin intolerance 08/13/2014  . CHB (complete heart  block) s/p AV node ablation 08/13/2014  . Chronic diastolic heart failure ([place]) 08/13/2014  . A-fib ([place]) 08/12/2014    Orientation RESPIRATION BLADDER Height & Weight     Self  Normal Incontinent, External catheter(Placed 01/13/19) Weight:   Height:     BEHAVIORAL SYMPTOMS/MOOD NEUROLOGICAL BOWEL NUTRITION STATUS      Continent Diet(Regular)  AMBULATORY STATUS COMMUNICATION OF NEEDS Skin   Extensive Assist Verbally Normal                       Personal Care Assistance Level of Assistance  Bathing, Feeding, Dressing           Functional Limitations Info  Sight, Hearing, Speech Sight Info: Adequate Hearing Info: Impaired Speech Info: Adequate    SPECIAL CARE FACTORS FREQUENCY  OT (By licensed OT), Speech therapy       OT Frequency: Evaluated 2/6. OT at ALF - Eval and Treat     Speech Therapy Frequency: Evaluated 2/6      Contractures Contractures Info: Not present    Additional Factors Info  Code Status, Allergies Code Status Info: DNR Allergies Info: Rosuvastatin Calcium, Statins           Current Medications (01/14/2019):  This is the current hospital active medication list Current Facility-Administered Medications  Medication Dose Route Frequency Provider Last Rate Last Dose  . acetaminophen (TYLENOL) tablet 650 mg  650 mg Oral Q4H PRN Etta Quill, DO      . apixaban Arne Cleveland) tablet 5 mg  5 mg Oral BID Etta Quill, DO   5 mg at 01/14/19 6301  . azithromycin (ZITHROMAX) 500 mg in sodium chloride 0.9 % 250 mL IVPB  500 mg Intravenous Q24H Etta Quill, DO 250 mL/hr at 01/13/19 2336 500 mg at 01/13/19 2336  . benzocaine-menthol (CHLORAEPTIC) lozenge 1 lozenge  1 lozenge Oral Q6H PRN Etta Quill, DO      . brimonidine (ALPHAGAN) 0.2 % ophthalmic solution 1 drop  1 drop Both Eyes TID Etta Quill, DO   1 drop at 01/14/19 [phone]  . carvedilol (COREG) tablet 3.125 mg  3.125 mg Oral BID Jennette Kettle M, DO   3.125 mg at 01/14/19 4403  .  cefTRIAXone (ROCEPHIN) 1 g in sodium chloride 0.9 % 100 mL IVPB  1 g Intravenous Q24H Jennette Kettle M, DO 200 mL/hr at 01/13/19 2253 1 g at 01/13/19 2253  . divalproex (DEPAKOTE) DR tablet 125 mg  125 mg Oral BID Etta Quill, DO   125 mg at 01/14/19 4742  . docusate sodium (COLACE) capsule 100 mg  100 mg Oral BID PRN Etta Quill, DO      . donepezil (ARICEPT) tablet 10 mg  10 mg Oral QHS Jennette Kettle M, DO   10 mg at 01/13/19 2301  . ezetimibe (ZETIA) tablet 10 mg  10 mg Oral QHS Jennette Kettle M, DO   10 mg at 01/13/19 2301  . finasteride (PROSCAR) tablet 5 mg  5 mg Oral Daily Jennette Kettle M, DO   5 mg at 01/14/19 5956  . LORazepam (ATIVAN) tablet 0.5 mg  0.5 mg Oral Q12H PRN Etta Quill, DO      . MEDLINE mouth rinse  15 mL Mouth Rinse BID Jennette Kettle M, DO   15 mL at 01/13/19 2302  . mirtazapine (REMERON) tablet 15 mg  15 mg Oral QHS Jennette Kettle M, DO   15 mg at 01/13/19 2259  . ondansetron (ZOFRAN) injection 4 mg  4 mg Intravenous Q6H PRN Etta Quill, DO      . tamsulosin [facility]) capsule 0.4 mg  0.4 mg Oral QHS Jennette Kettle M, DO   0.4 mg at 01/13/19 2259  . traZODone (DESYREL) tablet 25 mg  25 mg Oral QHS Jennette Kettle M, DO   25 mg at 01/13/19 2300     Discharge Medications: Please see discharge summary for a list of discharge medications.  Relevant Imaging Results:  Relevant Lab Results:   Additional Information DISCHARGE MEDICATIONS:  Sable Feil, LCSW

## 2019-01-14 NOTE — Progress Notes (Signed)
  Speech Language Pathology Treatment: Dysphagia  Patient Details Name: Grant Lawrence MRN: [id number] DOB: 11-02-1931 Today's Date: 01/14/2019 Time: 1155-2080 SLP Time Calculation (min) (ACUTE ONLY): 16 min  Assessment / Plan / Recommendation Clinical Impression  Paged that pt's daughter arrived. SLP reviewed pt's dysphagia history and observations during swallow assessment this morning including s/s aspiration. Options were discussed/educated and daughter stated she would like to pursue comfort feeds, accepting risks without restrictions on po's. SLP reviewed strategies to decrease risk of aspiration. Spoke with Dr. Erlinda Hong to update- she will discuss Palliative care as pt will likely continue to have future pna's. Ordered regular texture/thin liquids.   HPI HPI: Grant Lawrence is a 85 y.o. male with medical history significant of A.Fib on eliquis, dementia, CHF, PPM s/p ablation of AV node. Patient presents to the ED with reported episodes of vomiting. Per chart concern for aspiration, patient is not normally O2 dependent but was satting 85% on RA when EMS was called to ALF. CXR shows B basilar infiltrates.  CT abd/pelvis is neg for any acute intra-abdominal pathology but also shows B basilar infiltrates suspicious for PNA. BSE 08/2018 revealed normal swallow, dtr reports coughing during meals, reg/thin recommended.       SLP Plan  Other (Comment)(all education completed)       Recommendations  Diet recommendations: Regular;Thin liquid Liquids provided via: Cup Medication Administration: Crushed with puree Supervision: Full supervision/cueing for compensatory strategies;Patient able to self feed Compensations: Minimize environmental distractions;Slow rate;Small sips/bites Postural Changes and/or Swallow Maneuvers: Seated upright 90 degrees                Oral Care Recommendations: Oral care BID Follow up Recommendations: None SLP Visit Diagnosis: Dysphagia, unspecified  (R13.10) Plan: Other (Comment)(all education completed)       GO                Grant Lawrence 01/14/2019, 1:32 PM  Grant Lawrence M.Ed Risk analyst [phone] Office [phone]

## 2019-01-14 NOTE — Evaluation (Signed)
Clinical/Bedside Swallow Evaluation Patient Details  Name: Grant Lawrence MRN: [id number] Date of Birth: 06/13/1931  Today's Date: 01/14/2019 Time: SLP Start Time (ACUTE ONLY): 3428 SLP Stop Time (ACUTE ONLY): 1005 SLP Time Calculation (min) (ACUTE ONLY): 7 min  Past Medical History:  Past Medical History:  Diagnosis Date  . A-fib ([place])   . Angina pectoris ([place])   . Asymptomatic microscopic hematuria   . Cardiac defibrillator in place   . Cardiac pacemaker   . CHF (congestive heart failure) ([place])   . Coronary artery disease   . Dementia ([place])   . Hyperlipidemia   . Hypertension   . Long term (current) use of anticoagulants    Past Surgical History:  Past Surgical History:  Procedure Laterality Date  . CARDIAC DEFIBRILLATOR PLACEMENT  09/26/2009  . CORONARY ARTERY BYPASS GRAFT  2005  . CORONARY ARTERY BYPASS GRAFT    . IMPLANTABLE CARDIOVERTER DEFIBRILLATOR (ICD) GENERATOR CHANGE N/A 08/23/2014   Procedure: ICD GENERATOR CHANGE;  Surgeon: Sanda Klein, MD;  Location: [facility];  Service: Cardiovascular;  Laterality: N/A;  . MITRAL VALVE REPAIR (MV)/CORONARY ARTERY BYPASS GRAFTING (CABG)    . MITRAL VALVULOPLASTY  2005   HPI:  Grant Lawrence is a 85 y.o. male with medical history significant of A.Fib on eliquis, dementia, CHF, PPM s/p ablation of AV node. Patient presents to the ED with reported episodes of vomiting. Per chart concern for aspiration, patient is not normally O2 dependent but was satting 85% on RA when EMS was called to ALF. CXR shows B basilar infiltrates.  CT abd/pelvis is neg for any acute intra-abdominal pathology but also shows B basilar infiltrates suspicious for PNA. BSE 08/2018 revealed normal swallow, dtr reports coughing during meals, reg/thin recommended.    Assessment / Plan / Recommendation Clinical Impression  Pt demonstrated decreased cooperation during assessment and needed encouragement to consume water which revealed s/s aspiration. Vocal  quality wet, immediate throat clear and swallow/respiration appeared discoordinated. RN reported pt coughing with jello, pt affirmed swallow difficulty and during swallow assessment in 2019 pt's daughter reported coughing during meals. Pt has dementia and is unable to appreciate need for MBS which SLP discussed with pt who became frustrated and repeatedly refused test. He did not want me to call his daughter to discuss. SLP will attempt once more. He is currently on a clear liquid diet which recommend continuing.  NOTE: SLP spoke with Dr. Erlinda Hong and RN will page this SLP if family arrives for possible pt agreement to [facility].   SLP Visit Diagnosis: Dysphagia, unspecified (R13.10)    Aspiration Risk  Mild aspiration risk    Diet Recommendation Thin liquid(clear liquids until advanced by MD)   Liquid Administration via: Cup Medication Administration: Crushed with puree Supervision: Full supervision/cueing for compensatory strategies;Patient able to self feed Compensations: Minimize environmental distractions;Slow rate;Small sips/bites Postural Changes: Seated upright at 90 degrees    Other  Recommendations Oral Care Recommendations: Oral care BID   Follow up Recommendations (?)      Frequency and Duration min 1 x/week  1 week       Prognosis Barriers to Reach Goals: Cognitive deficits      Swallow Study   General HPI: Grant Lawrence is a 85 y.o. male with medical history significant of A.Fib on eliquis, dementia, CHF, PPM s/p ablation of AV node. Patient presents to the ED with reported episodes of vomiting. Per chart concern for aspiration, patient is not normally O2 dependent but was satting 85%  on RA when EMS was called to ALF. CXR shows B basilar infiltrates.  CT abd/pelvis is neg for any acute intra-abdominal pathology but also shows B basilar infiltrates suspicious for PNA. BSE 08/2018 revealed normal swallow, dtr reports coughing during meals, reg/thin recommended.  Type of Study: Bedside  Swallow Evaluation Previous Swallow Assessment: (see HPI) Diet Prior to this Study: Thin liquids(clear liquids) Temperature Spikes Noted: No Respiratory Status: Room air History of Recent Intubation: No Behavior/Cognition: Lethargic/Drowsy;Confused;Requires cueing Oral Cavity Assessment: Other (comment)(difficult to fully assess) Oral Care Completed by SLP: No Oral Cavity - Dentition: Missing dentition(unable to determine fully) Self-Feeding Abilities: Needs set up;Needs assist Patient Positioning: Upright in chair Baseline Vocal Quality: Normal Volitional Cough: Weak    Oral/Motor/Sensory Function Overall Oral Motor/Sensory Function: Other (comment)(not cooperative)   Ice Chips Ice chips: Not tested   Thin Liquid Thin Liquid: Impaired Presentation: Straw Oral Phase Impairments: (no overt abnormality) Oral Phase Functional Implications: (no overt abnormality) Pharyngeal  Phase Impairments: Wet Vocal Quality;Throat Clearing - Immediate;Other (comments)(appeared to have decreased coordination)    Nectar Thick Nectar Thick Liquid: Not tested   Honey Thick Honey Thick Liquid: Not tested   Puree Puree: Not tested   Solid     Solid: Not tested      Grant Lawrence 01/14/2019,10:41 AM   Grant Lawrence Risk analyst [phone] Office [phone]

## 2019-01-14 NOTE — Evaluation (Signed)
Physical Therapy Evaluation Patient Details Name: Grant Lawrence MRN: [id number] DOB: [birth year] Today's Date: 01/14/2019   History of Present Illness  Grant Lawrence is a 85 y.o. male with medical history significant of A.Fib on eliquis, dementia, CHF, PPM s/p ablation of AV node. Patient admitted with community acquired pneumonia.   Clinical Impression  Pt admitted with above diagnosis. Pt currently with functional limitations due to the deficits listed below (see PT Problem List). At the time of PT eval pt was able to perform transfers with +2 assist for balance support and safety with RW. Pt required multimodal cues to initiate a motor response at times. Based on performance today, feel SNF would be the most appropriate d/c disposition, to maximize functional independence and safety prior to return to ALF. Autely, pt will benefit from skilled PT to increase their independence and safety with mobility to allow discharge to the venue listed below.       Follow Up Recommendations SNF;Supervision/Assistance - 24 hour    Equipment Recommendations  Other (comment)(TBD by next venue of care)    Recommendations for Other Services       Precautions / Restrictions Precautions Precautions: Fall Precaution Comments: dementia, waxing and waining can be agitated Restrictions Weight Bearing Restrictions: No      Mobility  Bed Mobility Overal bed mobility: Needs Assistance Bed Mobility: Rolling;Sidelying to Sit Rolling: Max assist;+2 for safety/equipment Sidelying to sit: Mod assist;+2 for physical assistance;+2 for safety/equipment       General bed mobility comments: cues and assist to initiate all movement  Transfers Overall transfer level: Needs assistance Equipment used: Rolling walker (2 wheeled) Transfers: Sit to/from [facility] Sit to Stand: Mod assist;+2 safety/equipment Stand pivot transfers: Mod assist;+2 safety/equipment       General transfer  comment: cues to initiate movement, initial attempt was mod A, and improved to min A for sit <>Stand mod A +2 for safety with pivot for management of RW and balance  Ambulation/Gait             General Gait Details: Unable to progress gait training this session.   Stairs            Wheelchair Mobility    Modified Rankin (Stroke Patients Only)       Balance Overall balance assessment: Needs assistance Sitting-balance support: Bilateral upper extremity supported;Feet supported Sitting balance-Leahy Scale: Fair Sitting balance - Comments: initially mod A - after short time able to sit min guard   Standing balance support: Bilateral upper extremity supported Standing balance-Leahy Scale: Poor Standing balance comment: dependent on RW for balance                             Pertinent Vitals/Pain Pain Assessment: Faces Faces Pain Scale: No hurt Pain Intervention(s): Monitored during session    Home Living Family/patient expects to be discharged to:: Assisted living               Home Equipment: Walker - 2 wheels Additional Comments: no family present and Pt not a good historian    Prior Function Level of Independence: Needs assistance   Gait / Transfers Assistance Needed: Amb with rolling walker     Comments: No family present to relay prior function     Hand Dominance        Extremity/Trunk Assessment   Upper Extremity Assessment Upper Extremity Assessment: Generalized weakness    Lower Extremity Assessment Lower Extremity  Assessment: Generalized weakness    Cervical / Trunk Assessment Cervical / Trunk Assessment: Other exceptions(rounded shoulders, fwd head with generally flexed posture)  Communication   Communication: No difficulties  Cognition Arousal/Alertness: Lethargic Behavior During Therapy: WFL for tasks assessed/performed(waxed and wained throughout session- overall agreeable) Overall Cognitive Status: History of  cognitive impairments - at baseline                                 General Comments: requires cues (light physical and verbal) for initiation of tasks and movement      General Comments General comments (skin integrity, edema, etc.): With cues to look up and read the time on the clock pt was able to improve posture. Otherwise posture very flexed.     Exercises     Assessment/Plan    PT Assessment Patient needs continued PT services  PT Problem List Decreased strength;Decreased activity tolerance;Decreased balance;Decreased mobility;Decreased knowledge of use of DME;Decreased safety awareness;Decreased knowledge of precautions;Pain       PT Treatment Interventions DME instruction;Gait training;Functional mobility training;Therapeutic activities;Therapeutic exercise;Neuromuscular re-education;Patient/family education    PT Goals (Current goals can be found in the Care Plan section)  Acute Rehab PT Goals Patient Stated Goal: get to sleep PT Goal Formulation: Patient unable to participate in goal setting Time For Goal Achievement: 01/28/19 Potential to Achieve Goals: Good    Frequency Min 2X/week   Barriers to discharge        Co-evaluation PT/OT/SLP Co-Evaluation/Treatment: Yes Reason for Co-Treatment: Necessary to address cognition/behavior during functional activity;For patient/therapist safety;To address functional/ADL transfers PT goals addressed during session: Mobility/safety with mobility;Balance;Proper use of DME         AM-PAC PT "6 Clicks" Mobility  Outcome Measure Help needed turning from your back to your side while in a flat bed without using bedrails?: A Lot Help needed moving from lying on your back to sitting on the side of a flat bed without using bedrails?: A Lot Help needed moving to and from a bed to a chair (including a wheelchair)?: A Lot Help needed standing up from a chair using your arms (e.g., wheelchair or bedside chair)?: A  Lot Help needed to walk in hospital room?: Total Help needed climbing 3-5 steps with a railing? : Total 6 Click Score: 10    End of Session Equipment Utilized During Treatment: Gait belt Activity Tolerance: Patient tolerated treatment well Patient left: in chair;with call bell/phone within reach;with chair alarm set Nurse Communication: Mobility status PT Visit Diagnosis: Unsteadiness on feet (R26.81);Muscle weakness (generalized) (M62.81);Difficulty in walking, not elsewhere classified (R26.2)    Time: 3151-7616 PT Time Calculation (min) (ACUTE ONLY): 31 min   Charges:   PT Evaluation $PT Eval Moderate Complexity: 1 Mod          Rolinda Roan, PT, DPT Acute Rehabilitation Services Pager: [phone] Office: [phone]   Thelma Comp 01/14/2019, 2:07 PM

## 2019-01-14 NOTE — Progress Notes (Signed)
Report given to Watt Climes at [facility]. Pt will be transport by [facility].

## 2019-01-14 NOTE — Clinical Social Work Note (Addendum)
Grant Lawrence is medically stable for discharge and will be returning to [facility] via ambulance. Daughter, Grant Lawrence contacted and message left on her cell and home numbers regarding discharge. CSW talked with med tech Dwynettea regarding patient's discharge and clinicals transmitted to facility. CSW signing off as no other SW intervention services needed.  Grant Lawrence, MSW, LCSW Licensed Clinical Social Worker [place] [phone]

## 2019-01-14 NOTE — Discharge Summary (Addendum)
Discharge Summary  Grant Lawrence XTK:[id number] DOB: [birth year]  PCP: Marletta Lor, MD  Admit date: 01/12/2019 Discharge date: 01/14/2019  Time spent: 45mins, more than 50% time spent on coordination of care.  Recommendations for Outpatient Follow-up:  1. F/u with PCP within a week  for hospital discharge follow up, repeat cbc/bmp at follow up. 2. ALF to arrange palliative care to follow patient 3. Home health ordered  Discharge Diagnoses:  Active Hospital Problems   Diagnosis Date Noted  . Community acquired pneumonia of both lower lobes ([place]) 01/13/2019  . FTT (failure to thrive) in adult   . Nausea and vomiting 01/13/2019  . Acute respiratory failure with hypoxia ([place]) 01/13/2019  . Recurrent aspiration pneumonia ([place]) 08/28/2018  . Dementia ([place]) 09/09/2017  . HTN (hypertension) 08/13/2014  . CHB (complete heart block) s/p AV node ablation 08/13/2014  . A-fib [facility]) 08/12/2014    Resolved Hospital Problems  No resolved problems to display.    Discharge Condition: stable  Diet recommendation: regular diet, aspiration precaution, patient must sit up at [place] when eating, speech to continue to follow patient. Palliative care to follow patient ( family agrees to it)  There were no vitals filed for this visit.  History of present illness: (per admitting MD Dr Alcario Drought) PCP: Marletta Lor, MD  Patient coming from: ALF  I have personally briefly reviewed patient's old medical records in [place]  Chief Complaint: Emesis  HPI: Grant Lawrence is a 85 y.o. male with medical history significant of A.Fib on eliquis, dementia, PPM s/p ablation of AV node.  Patient presents to the ED with reported episodes of vomiting since 1500 today.  Concern for aspiration, patient is not normally O2 dependent but was satting 85% on RA when EMS was called to ALF.   ED Course: CXR shows B basilar infiltrates.  CT abd/pelvis is neg for any acute  intra-abdominal pathology but also shows B basilar infiltrates suspicious for PNA.  Vomiting controlled.  Patient denies any abd pain at this time and is resting comfortably.  Started on Rocephin / azithro.  Hospital Course:  Principal Problem:   Community acquired pneumonia of both lower lobes ([place]) Active Problems:   A-fib ([place])   HTN (hypertension)   CHB (complete heart block) s/p AV node ablation   Dementia (HCC)   Recurrent aspiration pneumonia (HCC)   Nausea and vomiting   Acute respiratory failure with hypoxia (HCC)   FTT (failure to thrive) in adult  Recurrent aspiration pneumonia/acute hypoxic respiratory failure -02 satting 85% on RA per  EMS report, he is found to have right lower lobe -he has no fever, no leukocytosis, he does not have significant cough ( not able to collect sputum sample) -mrsa screening is negative, urine strep pneumo antigen negative, respiratory vial panel negative. Blood culture no growth -he is treated with rocephin and zithromax in the hospital -speech therapist consulted, family decided to forgo further testing for swallowing/aspiration. Family prefer patient to be on regular diet for comfort and pleasure aware the risk of recurrent aspiration pneumonia, family does not want feeding tube, family agree to meet with palliative care at ALF.  -hypoxia resolved, he is feeling better, patient is discharged on augmentin to finish treatment for aspiration pneumonia -ALF to arrange palliative care to follow patient  Vomiting (presenting symptom) Per family there is gi bug in the ALF, possible viral etology No vomiting in the hospital, tolerating diet in the hospital. CT ab no acute findings.  H/o atrial fibrillation status post AV node ablation with a [employer]. Jude Unify dual-chamber biventricular pacemaker/defibrillator implanted in October 2010, generator changeout in 2015 S/P mitral valve annuloplasty repair,  CADstatus post remote myocardial  infarction 1999, status post coronary bypass surgery August 2005 without coronary event since that time. He is on coreg/eliquis,  he is followed by cardiology Dr Sallyanne Kuster   BPH continue flomax/proscar  Dementia/FTT Chronic , progressive Family prefer patient return to ALF where patient's wife stays as well. Home health arranged ALF to arrange palliative care   Procedures:  none  Consultations:  Speech therapist  Case manager  Social worker   Discharge Exam: BP (!) 116/57 (BP Location: Right Arm)   Pulse 74   Temp 97.6 F (36.4 C)   Resp 18   SpO2 96%   General: sitting up in chair, cooperative, oriented to self, recognize daughter but not able to states her name Cardiovascular: paced rhythm Respiratory: diminished at right base, no wheezing, no rhonchi, no rales  Extremity: no edema  Discharge Instructions You were cared for by a hospitalist during your hospital stay. If you have any questions about your discharge medications or the care you received while you were in the hospital after you are discharged, you can call the unit and asked to speak with the hospitalist on call if the hospitalist that took care of you is not available. Once you are discharged, your primary care physician will handle any further medical issues. Please note that NO REFILLS for any discharge medications will be authorized once you are discharged, as it is imperative that you return to your primary care physician (or establish a relationship with a primary care physician if you do not have one) for your aftercare needs so that they can reassess your need for medications and monitor your lab values.  Discharge Instructions    Diet general   Complete by:  As directed    Aspiration precaution, must be up at 90 degree  when eating   Increase activity slowly   Complete by:  As directed      Allergies as of 01/14/2019      Reactions   Rosuvastatin Calcium Other (See Comments)   Myalgia   Statins  Other (See Comments)   Myalgia      Medication List    TAKE these medications   acetaminophen 325 MG tablet Commonly known as:  TYLENOL Take 650 mg by mouth every 4 (four) hours as needed (pain). Do not exceed 3g in 24 hours   amoxicillin-clavulanate 500-125 MG tablet Commonly known as:  AUGMENTIN Take 1 tablet (500 mg total) by mouth 2 (two) times daily for 5 days.   brimonidine 0.2 % ophthalmic solution Commonly known as:  ALPHAGAN Place 1 drop into both eyes 3 (three) times daily.   CALMOSEPTINE 0.44-20.6 % Oint Generic drug:  Menthol-Zinc Oxide Apply 1 application topically daily as needed (redness).   carvedilol 3.125 MG tablet Commonly known as:  COREG TAKE ONE TABLET BY MOUTH TWICE DAILY WITH MEALS What changed:  when to take this   [place] MT Use as directed 2 sprays in the mouth or throat every 6 (six) hours as needed (sore throat).   divalproex 125 MG DR tablet Commonly known as:  DEPAKOTE Take 125 mg by mouth 2 (two) times daily.   docusate sodium 100 MG capsule Commonly known as:  COLACE Take 100 mg by mouth 2 (two) times daily.   donepezil 10 MG tablet Commonly  known as:  ARICEPT Take 10 mg by mouth at bedtime.   ELIQUIS 5 MG Tabs tablet Generic drug:  apixaban Take 5 mg by mouth 2 (two) times daily.   ezetimibe 10 MG tablet Commonly known as:  ZETIA Take 1 tablet (10 mg total) daily by mouth. What changed:  when to take this   finasteride 5 MG tablet Commonly known as:  PROSCAR Take 5 mg by mouth daily.   LORazepam 0.5 MG tablet Commonly known as:  ATIVAN Take 0.5 mg by mouth every 12 (twelve) hours as needed for anxiety.   mirtazapine 15 MG tablet Commonly known as:  REMERON Take 15 mg by mouth at bedtime.   NUTRITIONAL SUPPLEMENT Liqd Take 120 mLs by mouth 3 (three) times daily. House supplement   nystatin powder Generic drug:  nystatin Apply 1 g topically every 6 (six) hours as needed (rash).   Probiotic 250 MG  Caps Take 30 capsules by mouth daily.   tamsulosin 0.4 MG Caps capsule Commonly known as:  FLOMAX Take 0.4 mg by mouth at bedtime.   traZODone 50 MG tablet Commonly known as:  DESYREL Take 12.5 mg by mouth at bedtime.      Allergies  Allergen Reactions  . Rosuvastatin Calcium Other (See Comments)    Myalgia  . Statins Other (See Comments)    Myalgia   Follow-up Information    Marletta Lor, MD Follow up in 1 week(s).   Specialty:  Internal Medicine Why:  hospital discharge follow up, repeat cbc/bmp at follow up. pcp to repeat cxr two view in 3-4 weeks. Contact information: [place] [address] [phone]        please have palliative care see patient at ALF Follow up in 2 week(s).            The results of significant diagnostics from this hospitalization (including imaging, microbiology, ancillary and laboratory) are listed below for reference.    Significant Diagnostic Studies: Dg Chest 2 View  Result Date: 01/12/2019 CLINICAL DATA:  Evaluate for aspiration EXAM: CHEST - 2 VIEW COMPARISON:  11/17/2018 cans 08/28/2018 FINDINGS: Patient has a LEFT-sided transvenous pacemaker with leads to the RIGHT atrium, RIGHT ventricle, and coronary sinus. Status post median sternotomy and valve replacement. Heart is enlarged. There is patchy opacity within the LOWER RIGHT UPPER lobe and MEDIAL RIGHT LOWER lobe, associated with small pleural effusion. IMPRESSION: 1. Cardiomegaly without pulmonary edema. 2. RIGHT LOWER lobe and RIGHT LOWER lobe infiltrates. Followup PA and lateral chest X-ray is recommended in 3-4 weeks following trial of antibiotic therapy to ensure resolution and exclude underlying malignancy. Electronically Signed   By: Nolon Nations M.D.   On: 01/12/2019 20:51   Ct Abdomen Pelvis W Contrast  Result Date: 01/12/2019 CLINICAL DATA:  Projectile vomiting. Acute abdominal pain generalized. EXAM: CT ABDOMEN AND PELVIS WITH CONTRAST  TECHNIQUE: Multidetector CT imaging of the abdomen and pelvis was performed using the standard protocol following bolus administration of intravenous contrast. CONTRAST:  157mL OMNIPAQUE IOHEXOL 300 MG/ML  SOLN COMPARISON:  08/28/2018 FINDINGS: Lower chest: Consolidation in both lung bases, greater on the right, and progressing since previous study. This may represent pneumonia. Cardiac enlargement. Postoperative changes in the mediastinum. Hepatobiliary: No focal liver abnormality is seen. No gallstones, gallbladder wall thickening, or biliary dilatation. Pancreas: Unremarkable. No pancreatic ductal dilatation or surrounding inflammatory changes. Spleen: Normal in size without focal abnormality. Adrenals/Urinary Tract: Adrenal glands are unremarkable. Kidneys are normal, without renal calculi, focal lesion, or  hydronephrosis. Bladder is decompressed. Stomach/Bowel: Stomach, small bowel, and colon are not abnormally distended. No wall thickening or inflammatory changes are appreciated. Appendix is normal. Vascular/Lymphatic: Aortic atherosclerosis. No enlarged abdominal or pelvic lymph nodes. Reproductive: Prostate gland is enlarged, measuring 5.7 cm diameter. Other: No free air or free fluid in the abdomen. Postoperative changes consistent with inguinal hernia repairs. Musculoskeletal: Degenerative changes in the spine and hips. IMPRESSION: 1. No acute process demonstrated in the abdomen or pelvis. No evidence of bowel obstruction or inflammation. 2. Consolidation in both lung bases suggesting pneumonia. 3. Enlarged prostate gland. Electronically Signed   By: Lucienne Capers M.D.   On: 01/12/2019 23:23    Microbiology: Recent Results (from the past 240 hour(s))  Culture, blood (routine x 2) Call MD if unable to obtain prior to antibiotics being given     Status: None (Preliminary result)   Collection Time: 01/13/19  2:30 AM  Result Value Ref Range Status   Specimen Description BLOOD LEFT ARM  Final    Special Requests   Final    BOTTLES DRAWN AEROBIC AND ANAEROBIC Blood Culture adequate volume   Culture   Final    NO GROWTH 1 DAY Performed at [facility], [address] [place], [place] [address]    Report Status PENDING  Incomplete  Culture, blood (routine x 2) Call MD if unable to obtain prior to antibiotics being given     Status: None (Preliminary result)   Collection Time: 01/13/19  2:33 AM  Result Value Ref Range Status   Specimen Description BLOOD RIGHT ARM  Final   Special Requests   Final    BOTTLES DRAWN AEROBIC ONLY Blood Culture results may not be optimal due to an excessive volume of blood received in culture bottles   Culture   Final    NO GROWTH 1 DAY Performed at [facility], [place] [address], [place], [place] [address]    Report Status PENDING  Incomplete  Respiratory Panel by PCR     Status: None   Collection Time: 01/13/19  2:36 AM  Result Value Ref Range Status   Adenovirus NOT DETECTED NOT DETECTED Final   Coronavirus 229E NOT DETECTED NOT DETECTED Final    Comment: (NOTE) The Coronavirus on the Respiratory Panel, DOES NOT test for the novel  Coronavirus (2019 nCoV)    Coronavirus HKU1 NOT DETECTED NOT DETECTED Final   Coronavirus NL63 NOT DETECTED NOT DETECTED Final   Coronavirus OC43 NOT DETECTED NOT DETECTED Final   Metapneumovirus NOT DETECTED NOT DETECTED Final   Rhinovirus / Enterovirus NOT DETECTED NOT DETECTED Final   Influenza A NOT DETECTED NOT DETECTED Final   Influenza B NOT DETECTED NOT DETECTED Final   Parainfluenza Virus 1 NOT DETECTED NOT DETECTED Final   Parainfluenza Virus 2 NOT DETECTED NOT DETECTED Final   Parainfluenza Virus 3 NOT DETECTED NOT DETECTED Final   Parainfluenza Virus 4 NOT DETECTED NOT DETECTED Final   Respiratory Syncytial Virus NOT DETECTED NOT DETECTED Final   Bordetella pertussis NOT DETECTED NOT DETECTED Final   Chlamydophila pneumoniae NOT DETECTED NOT DETECTED Final   Mycoplasma pneumoniae NOT  DETECTED NOT DETECTED Final    Comment: Performed at [facility], [address] [place],  [address]  MRSA PCR Screening     Status: None   Collection Time: 01/13/19  7:17 AM  Result Value Ref Range Status   MRSA by PCR NEGATIVE NEGATIVE Final    Comment:  The GeneXpert MRSA Assay (FDA approved for NASAL specimens only), is one component of a comprehensive MRSA colonization surveillance program. It is not intended to diagnose MRSA infection nor to guide or monitor treatment for MRSA infections. Performed at [facility], [place] [address] [place], [place] [address]      Labs: Basic Metabolic Panel: Recent Labs  Lab 01/12/19 2155 01/13/19 0228 01/14/19 0523  NA 142 141 142  K 4.9 4.7 4.2  CL 107 106 105  CO2 22 26 26   GLUCOSE 141* 148* 102*  BUN 23 20 17   CREATININE 0.96 0.99 0.87  CALCIUM 9.9 9.6 9.4  MG  --   --  1.8   Liver Function Tests: Recent Labs  Lab 01/12/19 2155  AST 28  ALT 18  ALKPHOS 72  BILITOT 1.7*  PROT 7.2  ALBUMIN 4.1   No results for input(s): LIPASE, AMYLASE in the last 168 hours. No results for input(s): AMMONIA in the last 168 hours. CBC: Recent Labs  Lab 01/12/19 1949 01/13/19 0228 01/14/19 0523  WBC 6.2 7.4 5.5  NEUTROABS 5.1  --  4.2  HGB 14.5 15.1 12.7*  HCT 44.1 46.4 38.8*  MCV 99.5 99.1 100.3*  PLT 132* 134* 103*   Cardiac Enzymes: No results for input(s): CKTOTAL, CKMB, CKMBINDEX, TROPONINI in the last 168 hours. BNP: BNP (last 3 results) No results for input(s): BNP in the last 8760 hours.  ProBNP (last 3 results) No results for input(s): PROBNP in the last 8760 hours.  CBG: No results for input(s): GLUCAP in the last 168 hours.     Signed:  Florencia Reasons MD, PhD  [facility] 01/14/2019, 1:08 PM

## 2019-01-14 NOTE — Care Management Note (Signed)
Case Management Note  Patient Details  Name: Grant Lawrence MRN: [id number] Date of Birth: [birth year]  Subjective/Objective:                    Action/Plan:  Spoke with daughter Grant Lawrence regarding home health. Patient from [facility] , plan to return via [place].  Grant Lawrence with SW will arrange PTAR and return to [place].   NCM called [facility] spoke with Grant Lawrence [phone] , was advised to fax home health orders to [phone]. Grant Lawrence with Grant Lawrence also aware. Expected Discharge Date:  01/14/19               Expected Discharge Plan:  [place]  In-House Referral:  Clinical Social Work  Discharge planning Services  CM Consult  Post Acute Care Choice:  Home Health Choice offered to:  Adult Children  DME Arranged:  N/A DME Agency:  NA  HH Arranged:  PT, Speech Therapy HH Agency:  [facility]  Status of Service:  Completed, signed off  If discussed at [place] of Stay Meetings, dates discussed:    Additional Comments:  Grant Favre, RN 01/14/2019, 12:54 PM

## 2019-01-14 NOTE — Evaluation (Signed)
Occupational Therapy Evaluation Patient Details Name: Grant Lawrence MRN: [id number] DOB: May 04, 1931 Today's Date: 01/14/2019    History of Present Illness Grant Lawrence is a 85 y.o. male with medical history significant of A.Fib on eliquis, dementia, CHF, PPM s/p ablation of AV node. Patient admitted with community acquired pneumonia.    Clinical Impression   Unsure of PLOF as Pt unreliable historian and no family present. Due to baseline dementia, Pt does not initiate movement or functional tasks and requires cues. Pt mod A +2 for bed mobility, mod to min A for transfers (+2 for safety) and min A for UB ADL, mod A for LB ADL. Pt requires assist for all OOB activities and so at this time recommending SNF level care post-acute to maximize safety and independence in ADL and functional transfers.     Follow Up Recommendations  SNF    Equipment Recommendations  Other (comment)(defer to next venue of care)    Recommendations for Other Services       Precautions / Restrictions Precautions Precautions: Fall Precaution Comments: dementia, waxing and waining can be agitated Restrictions Weight Bearing Restrictions: No      Mobility Bed Mobility Overal bed mobility: Needs Assistance Bed Mobility: Rolling;Sidelying to Sit Rolling: Max assist;+2 for safety/equipment Sidelying to sit: Mod assist;+2 for physical assistance;+2 for safety/equipment       General bed mobility comments: cues and assist to initiate all movement  Transfers Overall transfer level: Needs assistance Equipment used: Rolling walker (2 wheeled) Transfers: Sit to/from [facility] Sit to Stand: Mod assist;+2 safety/equipment Stand pivot transfers: Mod assist;+2 safety/equipment       General transfer comment: cues to initiate movement, initial attempt was mod A, and improved to min A for sit <>Stand mod A +2 for safety with pivot for management of RW and balance    Balance Overall balance  assessment: Needs assistance Sitting-balance support: Bilateral upper extremity supported;Feet supported Sitting balance-Leahy Scale: Fair Sitting balance - Comments: initially mod A - after short time able to sit min guard   Standing balance support: Bilateral upper extremity supported Standing balance-Leahy Scale: Poor Standing balance comment: dependent on RW for balance                           ADL either performed or assessed with clinical judgement   ADL Overall ADL's : Needs assistance/impaired Eating/Feeding: Set up;Sitting Eating/Feeding Details (indicate cue type and reason): clear liquid diet Grooming: Set up;Wash/dry hands;Wash/dry face;Sitting Grooming Details (indicate cue type and reason): in recliner Upper Body Bathing: Moderate assistance   Lower Body Bathing: Minimal assistance   Upper Body Dressing : Minimal assistance;Sitting   Lower Body Dressing: Moderate assistance;Sit to/from stand Lower Body Dressing Details (indicate cue type and reason): able to doff socks, requires assist to don Toilet Transfer: Moderate assistance;Stand-pivot;BSC;RW Toilet Transfer Details (indicate cue type and reason): cues to initiate transfer [place] and Hygiene: Moderate assistance;Sit to/from stand;+2 for safety/equipment Toileting - Clothing Manipulation Details (indicate cue type and reason): once up, Pt holds stand well, OT performed rear peri care     Functional mobility during ADLs: Moderate assistance;Rolling walker       Vision         Perception     Praxis      Pertinent Vitals/Pain Pain Assessment: Faces Faces Pain Scale: No hurt Pain Intervention(s): Monitored during session;Repositioned     Hand Dominance     Extremity/Trunk Assessment  Upper Extremity Assessment Upper Extremity Assessment: Generalized weakness   Lower Extremity Assessment Lower Extremity Assessment: Defer to PT evaluation   Cervical / Trunk  Assessment Cervical / Trunk Assessment: Other exceptions(rounded shoulders, fwd head)   Communication Communication Communication: No difficulties   Cognition Arousal/Alertness: Lethargic Behavior During Therapy: WFL for tasks assessed/performed(waxed and wained throughout session- overall agreeable) Overall Cognitive Status: History of cognitive impairments - at baseline                                 General Comments: requires cues (light physical and verbal) for initiation of tasks and movement   General Comments       Exercises     Shoulder Instructions      Home Living Family/patient expects to be discharged to:: Assisted living                             Home Equipment: Walker - 2 wheels   Additional Comments: no family present and Pt not a good historian      Prior Functioning/Environment Level of Independence: Needs assistance  Gait / Transfers Assistance Needed: Amb with rolling walker     Comments: No family present to relay prior function        OT Problem List: Decreased activity tolerance;Impaired balance (sitting and/or standing)      OT Treatment/Interventions: Self-care/ADL training;DME and/or AE instruction;Therapeutic activities;Patient/family education;Balance training;Therapeutic exercise    OT Goals(Current goals can be found in the care plan section) Acute Rehab OT Goals Patient Stated Goal: get to sleep OT Goal Formulation: With patient Time For Goal Achievement: 01/28/19 Potential to Achieve Goals: Good ADL Goals Pt Will Perform Grooming: standing;with min guard assist Pt Will Perform Upper Body Dressing: with supervision;sitting Pt Will Perform Lower Body Dressing: with min guard assist;sit to/from stand Pt Will Transfer to Toilet: with supervision;ambulating Pt Will Perform Toileting - Clothing Manipulation and hygiene: with supervision;sit to/from stand Additional ADL Goal #1: Pt will perform bed mobility at  supervision level prior to engaging in ADL  OT Frequency: Min 2X/week   Barriers to D/C:            Co-evaluation              AM-PAC OT "6 Clicks" Daily Activity     Outcome Measure Help from another person eating meals?: A Little Help from another person taking care of personal grooming?: A Little Help from another person toileting, which includes using toliet, bedpan, or urinal?: A Lot Help from another person bathing (including washing, rinsing, drying)?: A Lot Help from another person to put on and taking off regular upper body clothing?: A Little Help from another person to put on and taking off regular lower body clothing?: A Lot 6 Click Score: 15   End of Session Equipment Utilized During Treatment: Gait belt;Rolling walker Nurse Communication: Mobility status;Other (comment)(condom cath off, spoke with RN secretary about batteries for)  Activity Tolerance: Patient tolerated treatment well Patient left: in chair;with call bell/phone within reach;with chair alarm set  OT Visit Diagnosis: Unsteadiness on feet (R26.81);Other abnormalities of gait and mobility (R26.89);Muscle weakness (generalized) (M62.81);Adult, failure to thrive (R62.7)                Time: 0910-0940 OT Time Calculation (min): 30 min Charges:  OT General Charges $OT Visit: 1 Visit OT Evaluation $OT Eval Moderate Complexity:  [place] Pager: [phone] Office: [place] 01/14/2019, 12:58 PM

## 2019-01-18 LAB — CULTURE, BLOOD (ROUTINE X 2)
Culture: NO GROWTH
Culture: NO GROWTH
Special Requests: ADEQUATE

## 2019-02-04 IMAGING — CT CT HEAD W/O CM
3 series · 14 of 46 positions shown, 16 images · non-contrast
Comparison: June 04, 2017

CLINICAL DATA: Progressive dementia

EXAM:
CT HEAD WITHOUT CONTRAST
TECHNIQUE: Contiguous axial images were obtained from the base of the skull
through the vertex without intravenous contrast.

[Series 2: head 5.0 h37s · axial · 0.41mm/px · z∈[+131,+251]mm · 8 of 29 slices shown, 10 images]
[im 3/29  brain]
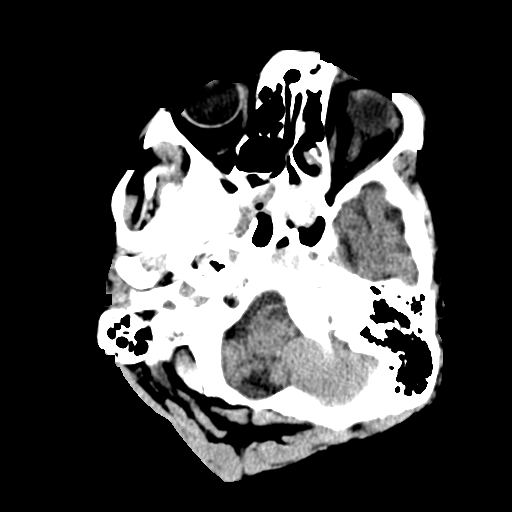
[im 3/29  bone]
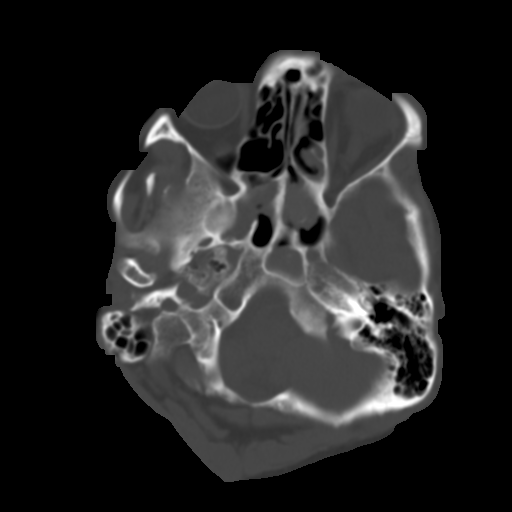
[im 7/29  brain]
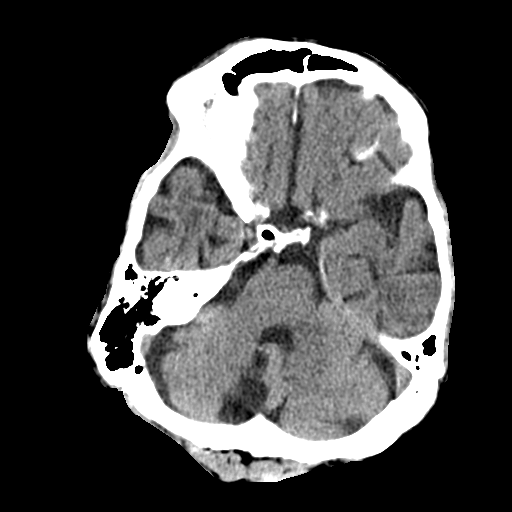
[im 10/29  brain]
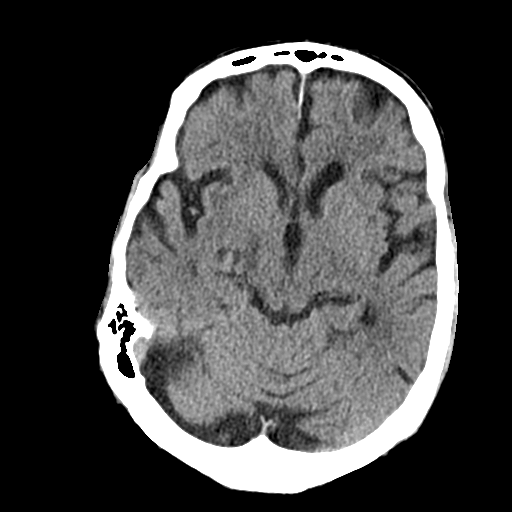
[im 13/29  brain]
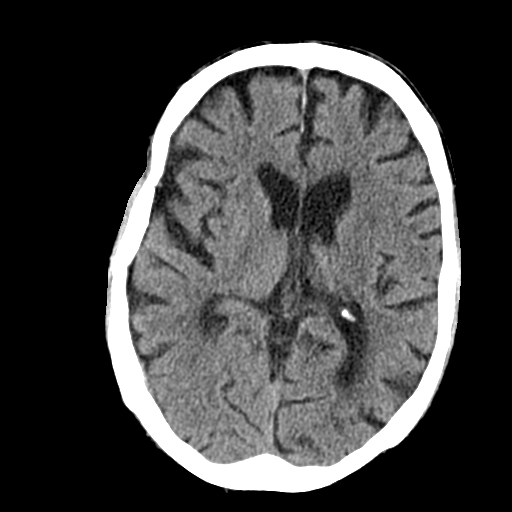
[im 17/29  brain]
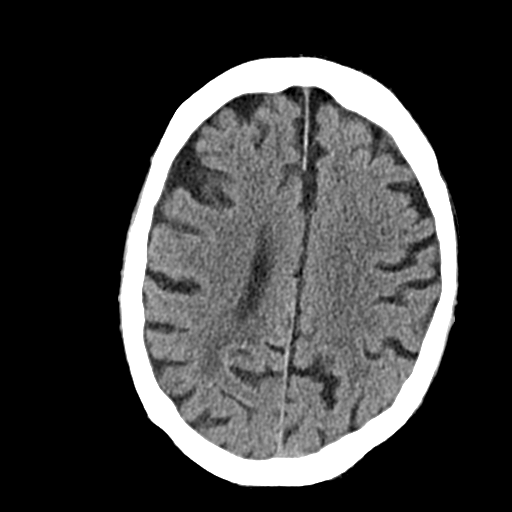
[im 17/29  bone]
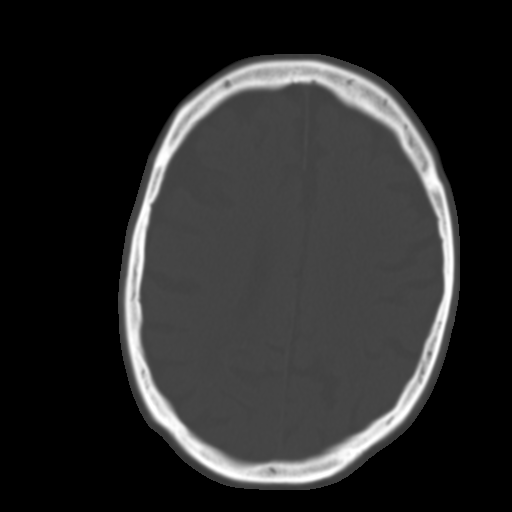
[im 20/29  brain]
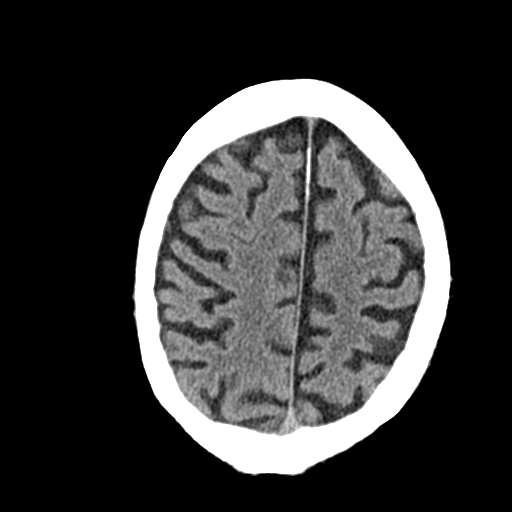
[im 23/29  brain]
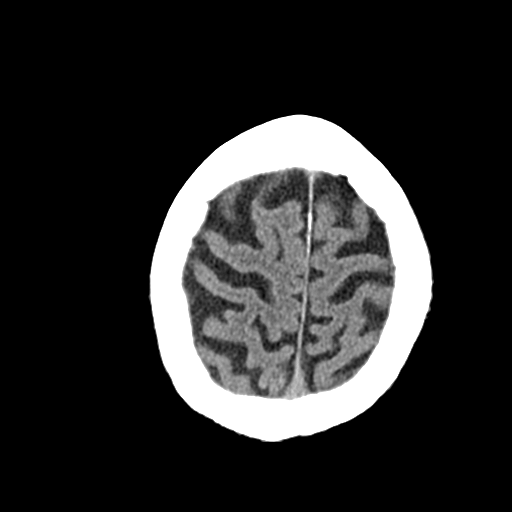
[im 27/29  brain]
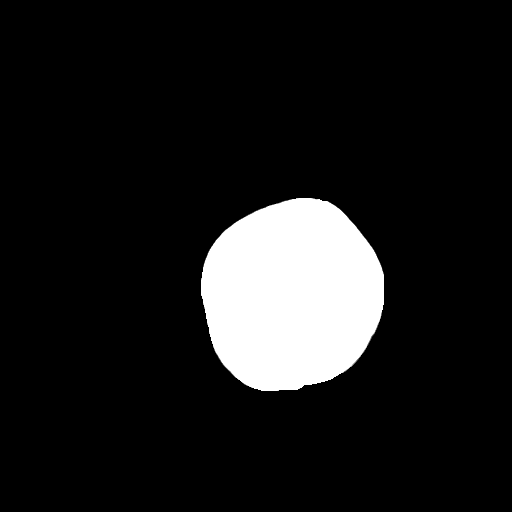

[Series 4: head 3.0 mpr coronal · coronal · 0.29mm/px · 3 of 64 slices shown]
[im 22/64  brain]
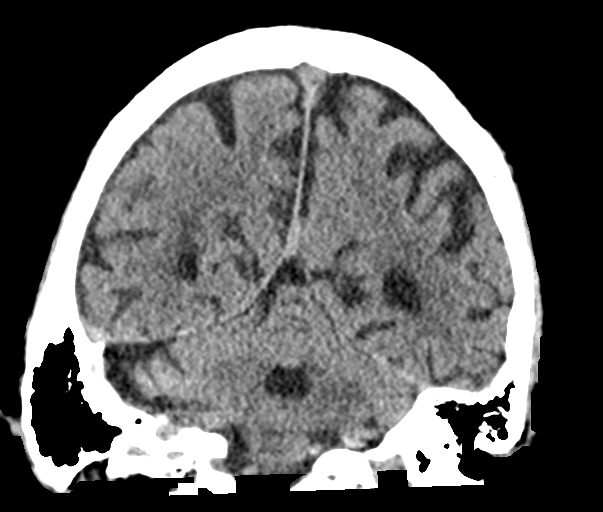
[im 29/64  brain]
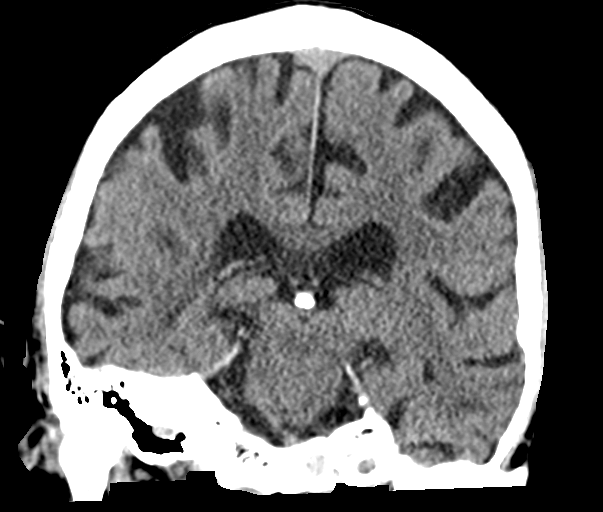
[im 36/64  brain]
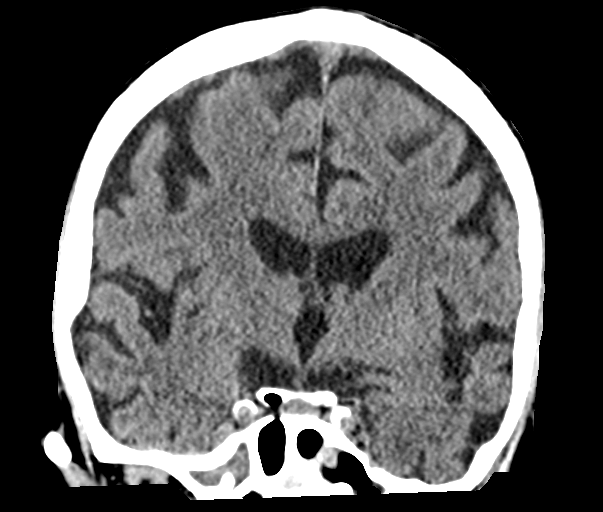

[Series 5: head 3.0 mpr sagittal · sagittal · 0.29mm/px · 3 of 53 slices shown]
[im 18/53  brain]
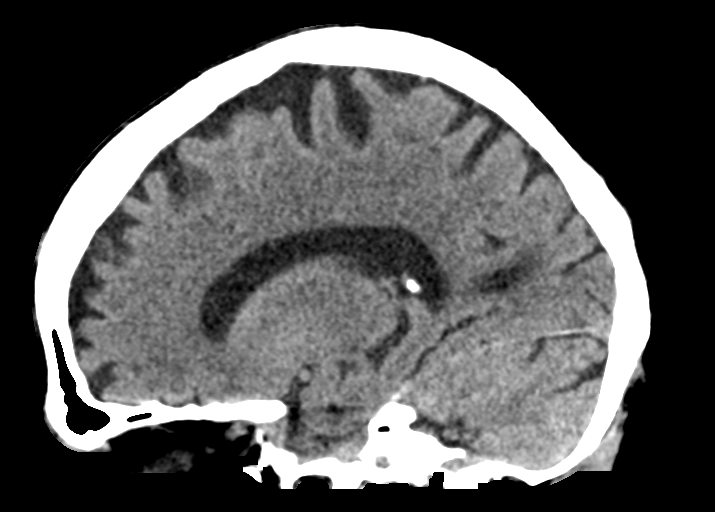
[im 27/53  brain]
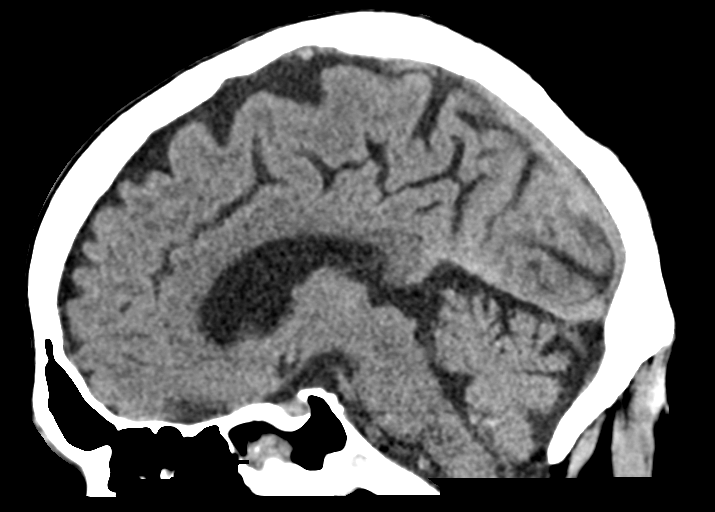
[im 35/53  brain]
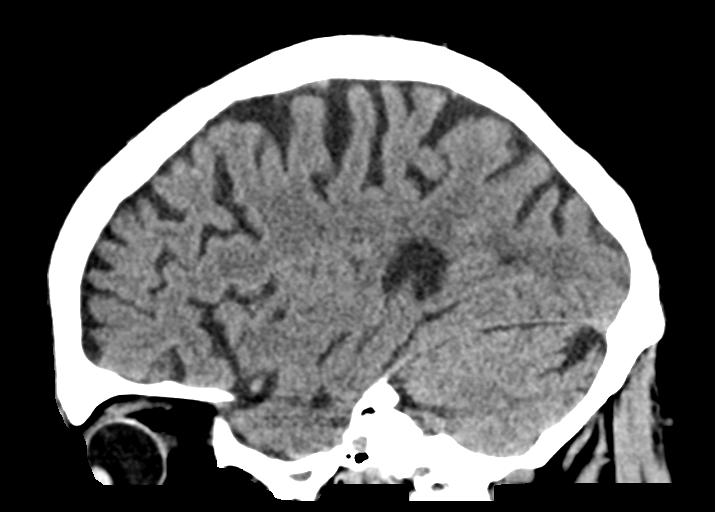

[14 of 46 positions shown; findings below may reference images not displayed]

FINDINGS: Brain: There is stable mild diffuse atrophy. There is no
intracranial mass hemorrhage, extra-axial fluid collection, or
midline shift. There is evidence of a prior infarct in the medial
posterior right cerebellum with localized encephalomalacia, stable.
There is slight small vessel disease in the centra semiovale
bilaterally. No acute infarct evident.

Vascular: No hyperdense vessel. There is calcification in each
carotid siphon region.

Skull: Bony calvarium appears intact.

Sinuses/Orbits: There is extensive mucosal thickening in both
sphenoid sinus regions. There is also opacification in a posterior
left ethmoid air cell. There is mucosal thickening in several
ethmoid air cells. Visualized orbits appear symmetric bilaterally.

Other: Visualized mastoid air cells are clear. There is debris in
the left external auditory canal.
IMPRESSION: Stable mild generalized atrophy. Prior infarct posteromedial right
cerebellum with encephalomalacia. Mild periventricular small vessel
disease. No intracranial mass, hemorrhage, or extra-axial fluid
collection. No acute infarct evident.

There are foci of arterial vascular calcification. There is
multifocal paranasal sinus disease, stable. Extensive sinusitis is
noted in the sphenoid regions bilaterally and in the posterior left
ethmoid region. There is probable cerumen in the left external
auditory canal.

## 2019-03-25 ENCOUNTER — Ambulatory Visit (INDEPENDENT_AMBULATORY_CARE_PROVIDER_SITE_OTHER): Payer: Medicare Other | Admitting: *Deleted

## 2019-03-25 ENCOUNTER — Other Ambulatory Visit: Payer: Self-pay

## 2019-03-25 DIAGNOSIS — I442 Atrioventricular block, complete: Secondary | ICD-10-CM | POA: Diagnosis not present

## 2019-03-25 LAB — CUP PACEART REMOTE DEVICE CHECK
Battery Remaining Longevity: 1 mo
Battery Remaining Percentage: 4 %
Battery Voltage: 2.63 V
Brady Statistic RV Percent Paced: 96 %
Date Time Interrogation Session: 20200416083022
HighPow Impedance: 41 Ohm
HighPow Impedance: 41 Ohm
Implantable Lead Implant Date: 20101019
Implantable Lead Implant Date: 20101019
Implantable Lead Implant Date: 20101019
Implantable Lead Location: 753858
Implantable Lead Location: 753859
Implantable Lead Location: 753860
Implantable Lead Model: 1999
Implantable Lead Model: 7121
Implantable Pulse Generator Implant Date: 20150915
Lead Channel Impedance Value: 400 Ohm
Lead Channel Impedance Value: 410 Ohm
Lead Channel Setting Pacing Amplitude: 2.5 V
Lead Channel Setting Pacing Amplitude: 3.25 V
Lead Channel Setting Pacing Pulse Width: 0.5 ms
Lead Channel Setting Pacing Pulse Width: 1 ms
Lead Channel Setting Sensing Sensitivity: 0.5 mV
Pulse Gen Serial Number: 7207729

## 2019-03-31 ENCOUNTER — Telehealth: Payer: Self-pay

## 2019-03-31 ENCOUNTER — Encounter: Payer: Self-pay | Admitting: Cardiology

## 2019-03-31 NOTE — Telephone Encounter (Addendum)
Virtual Visit Pre-Appointment Phone Call  "Mr Cottingham, I am calling you today to discuss your upcoming appointment. We are currently trying to limit exposure to the virus that causes COVID-19 by seeing patients at home rather than in the office."  1. "What is the BEST phone number to call the day of the visit?" - include this in appointment notes  2. "Do you have or have access to (through a family member/friend) a smartphone with video capability that we can use for your visit?" a. If yes - list this number in appt notes as "cell" (if different from BEST phone #) and list the appointment type as a VIDEO visit in appointment notes b. If no - list the appointment type as a PHONE visit in appointment notes  3. Confirm consent - "In the setting of the current Covid19 crisis, you are scheduled for a PHONE visit with your provider on (date) at (time).  Just as we do with many in-office visits, in order for you to participate in this visit, we must obtain consent.  If you'd like, I can send this to your mychart (if signed up) or email for you to review.  Otherwise, I can obtain your verbal consent now.  All virtual visits are billed to your insurance company just like a normal visit would be.  By agreeing to a virtual visit, we'd like you to understand that the technology does not allow for your provider to perform an examination, and thus may limit your provider's ability to fully assess your condition. If your provider identifies any concerns that need to be evaluated in person, we will make arrangements to do so.  Finally, though the technology is pretty good, we cannot assure that it will always work on either your or our end, and in the setting of a video visit, we may have to convert it to a phone-only visit.  In either situation, we cannot ensure that we have a secure connection.  Are you willing to proceed?" STAFF: Did the patient verbally acknowledge consent to telehealth visit? Document YES/NO  here: YES-consent was given by daughter Berniece Salines, POA  4. Advise patient to be prepared - "Two hours prior to your appointment, go ahead and check your blood pressure, pulse, oxygen saturation, and your weight (if you have the equipment to check those) and write them all down. When your visit starts, your provider will ask you for this information. If you have an Apple Watch or Kardia device, please plan to have heart rate information ready on the day of your appointment. Please have a pen and paper handy nearby the day of the visit as well."  5. Give patient instructions for MyChart download to smartphone OR Doximity/Doxy.me as below if video visit (depending on what platform provider is using)  6. Inform patient they will receive a phone call 15 minutes prior to their appointment time (may be from unknown caller ID) so they should be prepared to answer    TELEPHONE CALL NOTE  MYLEZ VENABLE has been deemed a candidate for a follow-up tele-health visit to limit community exposure during the Covid-19 pandemic. I spoke with the patient via phone to ensure availability of phone/video source, confirm preferred email & phone number, and discuss instructions and expectations.  I reminded Grant Lawrence to be prepared with any vital sign and/or heart rhythm information that could potentially be obtained via home monitoring, at the time of his visit. I reminded Grant Lawrence to  expect a phone call prior to his visit.  Harold Hedge, CMA 03/31/2019 4:36 PM   INSTRUCTIONS FOR DOWNLOADING THE MYCHART APP TO SMARTPHONE  - The patient must first make sure to have activated MyChart and know their login information - If Apple, go to [employer] and type in MyChart in the search bar and download the app. If Android, ask patient to go to [employer] and type in [place] in the search bar and download the app. The app is free but as with any other app downloads, their phone may require them to  verify saved payment information or Apple/Android password.  - The patient will need to then log into the app with their MyChart username and password, and select [facility] as their healthcare provider to link the account. When it is time for your visit, go to the MyChart app, find appointments, and click Begin Video Visit. Be sure to Select Allow for your device to access the Microphone and Camera for your visit. You will then be connected, and your provider will be with you shortly.  **If they have any issues connecting, or need assistance please contact MyChart service desk [phone])**  **If using a computer, in order to ensure the best quality for their visit they will need to use either of the following Internet Browsers: [employer], or Google Chrome**  IF USING DOXIMITY or DOXY.ME - The patient will receive a link just prior to their visit by text.     FULL LENGTH CONSENT FOR TELE-HEALTH VISIT   I hereby voluntarily request, consent and authorize [facility] and its employed or contracted physicians, physician assistants, nurse practitioners or other licensed health care professionals (the Practitioner), to provide me with telemedicine health care services (the "Services") as deemed necessary by the treating Practitioner. I acknowledge and consent to receive the Services by the Practitioner via telemedicine. I understand that the telemedicine visit will involve communicating with the Practitioner through live audiovisual communication technology and the disclosure of certain medical information by electronic transmission. I acknowledge that I have been given the opportunity to request an in-person assessment or other available alternative prior to the telemedicine visit and am voluntarily participating in the telemedicine visit.  I understand that I have the right to withhold or withdraw my consent to the use of telemedicine in the course of my care at any time,  without affecting my right to future care or treatment, and that the Practitioner or I may terminate the telemedicine visit at any time. I understand that I have the right to inspect all information obtained and/or recorded in the course of the telemedicine visit and may receive copies of available information for a reasonable fee.  I understand that some of the potential risks of receiving the Services via telemedicine include:  Marland Kitchen Delay or interruption in medical evaluation due to technological equipment failure or disruption; . Information transmitted may not be sufficient (e.g. poor resolution of images) to allow for appropriate medical decision making by the Practitioner; and/or  . In rare instances, security protocols could fail, causing a breach of personal health information.  Furthermore, I acknowledge that it is my responsibility to provide information about my medical history, conditions and care that is complete and accurate to the best of my ability. I acknowledge that Practitioner's advice, recommendations, and/or decision may be based on factors not within their control, such as incomplete or inaccurate data provided by me or distortions of diagnostic images or specimens  that may result from electronic transmissions. I understand that the practice of medicine is not an exact science and that Practitioner makes no warranties or guarantees regarding treatment outcomes. I acknowledge that I will receive a copy of this consent concurrently upon execution via email to the email address I last provided but may also request a printed copy by calling the office of [facility].    I understand that my insurance will be billed for this visit.   I have read or had this consent read to me. . I understand the contents of this consent, which adequately explains the benefits and risks of the Services being provided via telemedicine.  . I have been provided ample opportunity to ask questions regarding  this consent and the Services and have had my questions answered to my satisfaction. . I give my informed consent for the services to be provided through the use of telemedicine in my medical care  By participating in this telemedicine visit I agree to the above.

## 2019-03-31 NOTE — Progress Notes (Signed)
Remote ICD transmission.   

## 2019-03-31 NOTE — Telephone Encounter (Signed)
Hey Dr C,  This patient has an appt with you on 04/06/2019. I  tried to reach him but I spoke with his daughter, Berniece Salines, who is his POA. She stated patient is in [place] facility on [address]. She stated there is not always a nurse there. She states there is a nurse that comes to check on the patient a few times a week. I told her it may be best to have patient reschedule appt to August when the patient can be seen in the office.  She gave vit She stated she did not know if that was a good idea and thinks her dad needs an appointment because she thinks he needs a new battery for his defibrillaltor sometime this year. I told her I would reach out to you to see what you and do what you thought was best.

## 2019-04-01 ENCOUNTER — Telehealth: Payer: Self-pay | Admitting: *Deleted

## 2019-04-01 ENCOUNTER — Telehealth: Payer: Self-pay

## 2019-04-01 NOTE — Telephone Encounter (Signed)
Called back and LMOVM for Grant Lawrence. Advised I misspoke earlier--pt's device has a vibratory alert, not auditory. [place] phone number for questions/concerns.

## 2019-04-01 NOTE — Telephone Encounter (Addendum)
[employer] [phone]) and the gentleman I spoke with states they have a tablet that allows them to do virtual visits.  Spoke with Clide Deutscher, NP and she works for [employer]. She stated she would be more than happy to join in on a webex [place], she prefers to have these meetings in the afternoon because she sees patients in the morning. She also states she visits the patient on Wednesdays.  Courtney's phone number [phone] and her email address is [email or web address]  Called Daughter [name], she states her dad is hard of hearing and doesn't comprehend what is being said. She doesn't think he needs to be apart of this meeting. Is this ok?  I was thinking of scheduling the appt for 04/13/2019 in a hour slot since it will be 4 people  (both daughters, Clide Deutscher NP([employer]) possibly the patient and yourself) on the WebEx. And do you want me to go ahead and get this scheduled?

## 2019-04-01 NOTE — Telephone Encounter (Signed)
Will ask device clinic to make sure we are getting monthly device downloads for battery status. Please make sure his transmitter is at his bedside and plugged in. The downloads are automatic. As of last download this month, anticipate it will reach need for changeout trigger in next 3 months, but even from that date the will give [place] a 90-day extra period of full service (in other words, even if the battery alerts today, we have until late July to do the battery change). If he cannot get on a phone visit on 4/28, the appointment will be useless. Can we coordinate with the nursing stall at [facility]? Does he have a cell phone? With camera? MCr

## 2019-04-01 NOTE — Telephone Encounter (Signed)
Ideally, the NP will be with the patient/close to him. He needs to be included somehow to make it a patient visit MCr

## 2019-04-01 NOTE — Telephone Encounter (Signed)
Ok! So is it ok to exclude the patient? An ill do my best to get everyone informed as best as I can.

## 2019-04-01 NOTE — Telephone Encounter (Signed)
We can try that 3-way webex. Cringe... What equipment does the patient have? Smart phone?

## 2019-04-01 NOTE — Telephone Encounter (Signed)
Is there anything I need to do for this patient?   Thanks,  [name]

## 2019-04-01 NOTE — Telephone Encounter (Signed)
Darden Dates-- pt's daughter said she was anticipating a call back from you today about rescheduling appointment with Dr. Sallyanne Kuster. She said she spoke with you yesterday. She wants to do a 3-way virtual visit via Webex with Dr. Sallyanne Kuster. I'll also include his scheduler because I'm not sure who is setting these visits up. Thanks!

## 2019-04-01 NOTE — Telephone Encounter (Signed)
Go for it! Worth a shot.. (sigh, so many things could go wrong...)

## 2019-04-01 NOTE — Telephone Encounter (Signed)
Grant Lawrence,   [name]   Thanks,  [name]

## 2019-04-01 NOTE — Telephone Encounter (Signed)
Pt transmitting wirelessly. Next download scheduled for 5/18.  Chanetta Marshall, NP 04/01/2019 9:55 AM

## 2019-04-01 NOTE — Telephone Encounter (Signed)
Thanks, Amber

## 2019-04-01 NOTE — Telephone Encounter (Signed)
Spoke with Grant Lawrence to advise of ICD nearing ERI. Advised that his monitor is transmitting automatically and that they do not need to do anything differently at this time. Advised of alert tone. [name] verbalizes understanding.  She asked about plan for pt's upcoming appointment with Dr. Sallyanne Kuster. Pt has dementia and lives at [facility]. Grant Lawrence is his HCPOA. Pt's daughters are in agreement that they want his ICD turned off. She reports it is very important that the pt is not included in this portion of the discussion as he will not comprehend the reasoning and will think that his daughters are "trying to kill him." Explained that device may be downgraded to a pacemaker. Advised I will send this information to Dr. Sallyanne Kuster.  Clide Deutscher, RN, is patient's nurse at [facility]. Grant Lawrence reports it may be possible to do a 3-way WebEx with [name] and pt, his daughters, and Dr. Sallyanne Kuster. Advised I will make [place] aware that this may be possible. She verbalizes understanding and thanked me for my call.

## 2019-04-02 NOTE — Telephone Encounter (Signed)
Spoke with Dr C and web ex video is the route to go. The meeting  has been scheduled. Fingers crossed this works!  Called facility and spoke with Grant Lawrence the Executive Director informed her that the patient has a upcoming appt with Dr Grant Lawrence on 04/13/2019 at 2:00 pm. She was give the instructions for WebEx along with the meeting number ([phone]) and password (heart). She stated the facility has an iPad that will have this set up for the patient on the day of his appointment. She voiced understanding.  Called Grant Lawrence, [employer], and gave her the appointment date and time for [employer]. She stated she was familiar with WebEx and requested I send her a email invite as well as gave her the meeting number and password. Email invite sent!  Called Grant Lawrence back to inform her that myself or another one of my collages would be calling 15 minutes prior to patients appointment to review medications, vital signs and drug allergies. She voiced understanding and stated she would have all of the most recent information together before our call. She stated she did receive the WebEx invite as well.   Called patients daughter Grant Lawrence informed her that Dr C said that because this is a patient visit that the patient must be apart of his visit. Informed her that we could have the patient disconnect from the call early so that both daughters can speak with Dr C about their concerns. Grant Lawrence thought this was a great idea. She asked if I could send her and her sister a invite for the meeting and gave both email addresses. Invites sent right at that moment. She asked that I contact Grant Lawrence and the facility to make everyone aware and on the same page. Informed her that I have already contacted the facility and Grant Lawrence and everyone is on the same page. She told me I was awesome and thanked me for my call.  I also sent a mychart message with the meeting number and password in case for some reason she  does not get the emailed invites she will still be able to join the meeting.

## 2019-04-06 ENCOUNTER — Telehealth: Payer: Medicare Other | Admitting: Cardiovascular Disease

## 2019-04-13 ENCOUNTER — Telehealth (INDEPENDENT_AMBULATORY_CARE_PROVIDER_SITE_OTHER): Payer: Medicare Other | Admitting: Cardiovascular Disease

## 2019-04-13 DIAGNOSIS — E7800 Pure hypercholesterolemia, unspecified: Secondary | ICD-10-CM

## 2019-04-13 DIAGNOSIS — I5032 Chronic diastolic (congestive) heart failure: Secondary | ICD-10-CM | POA: Diagnosis not present

## 2019-04-13 DIAGNOSIS — F0150 Vascular dementia without behavioral disturbance: Secondary | ICD-10-CM

## 2019-04-13 DIAGNOSIS — Z9889 Other specified postprocedural states: Secondary | ICD-10-CM

## 2019-04-13 DIAGNOSIS — I340 Nonrheumatic mitral (valve) insufficiency: Secondary | ICD-10-CM

## 2019-04-13 DIAGNOSIS — I25810 Atherosclerosis of coronary artery bypass graft(s) without angina pectoris: Secondary | ICD-10-CM

## 2019-04-13 DIAGNOSIS — I4819 Other persistent atrial fibrillation: Secondary | ICD-10-CM | POA: Diagnosis not present

## 2019-04-13 DIAGNOSIS — I2510 Atherosclerotic heart disease of native coronary artery without angina pectoris: Secondary | ICD-10-CM

## 2019-04-13 DIAGNOSIS — Z4502 Encounter for adjustment and management of automatic implantable cardiac defibrillator: Secondary | ICD-10-CM

## 2019-04-13 DIAGNOSIS — I442 Atrioventricular block, complete: Secondary | ICD-10-CM

## 2019-04-13 DIAGNOSIS — Z789 Other specified health status: Secondary | ICD-10-CM

## 2019-04-13 DIAGNOSIS — I351 Nonrheumatic aortic (valve) insufficiency: Secondary | ICD-10-CM | POA: Diagnosis not present

## 2019-04-13 DIAGNOSIS — I493 Ventricular premature depolarization: Secondary | ICD-10-CM

## 2019-04-13 DIAGNOSIS — I361 Nonrheumatic tricuspid (valve) insufficiency: Secondary | ICD-10-CM

## 2019-04-13 DIAGNOSIS — Z95810 Presence of automatic (implantable) cardiac defibrillator: Secondary | ICD-10-CM

## 2019-04-13 DIAGNOSIS — I10 Essential (primary) hypertension: Secondary | ICD-10-CM

## 2019-04-13 NOTE — Progress Notes (Signed)
Virtual Visit via Video Note   This visit type was conducted due to national recommendations for restrictions regarding the COVID-19 Pandemic (e.g. social distancing) in an effort to limit this patient's exposure and mitigate transmission in our community.  Due to his co-morbid illnesses, this patient is at least at moderate risk for complications without adequate follow up.  This format is felt to be most appropriate for this patient at this time.  All issues noted in this document were discussed and addressed.  A limited physical exam was performed with this format.  Please refer to the patient's chart for his consent to telehealth for [facility].    We had a complex A/V WebEx meeting today.  This included the patient and the nursing staff at [facility], the patient's daughters and power of attorney Grant Lawrence and Grant Lawrence, has with his primary care provider, Grant Lawrence with "doctors making house calls"   Date:  04/13/2019   ID:  Grant Lawrence, DOB [birth year], MRN [id number]  Patient Location: [place] Provider Location: Home  PCP:  Grant Lor, MD  Cardiologist:  Grant Klein, MD  Electrophysiologist:  None   Evaluation Performed:  Follow-Up Visit  Chief Complaint:  CRT-D approaching ERI  History of Present Illness:    Grant Lawrence is a 85 y.o. male with non ischemic cardiomyopathy, chronic diastolic heart failure, complete heart block status post AV node ablation, permanent atrial fibrillation, history of severe systolic left ventricular dysfunction with recovery after arrhythmia control and biventricular pacing, here for CRT-D device follow-up.  He has never received VT/VF therapies from the device and has had normalization of left ventricular systolic function.  However he is device dependent and does not have an escape rhythm.  Ever since he has been taking care of him his left ventricular pacing threshold has been high, which has  limited to the lifespan of his generators.  Otherwise his device is functioning normally and he has good biventricular pacing percentage.  There is no evidence of fluid overload by monitoring his thoracic impedance.  The appointment today was triggered in part by the fact that Grant Lawrence's CRT-D generator is approaching elective replacement interval, anticipated to occur in less than 3 months.  This raises the discussion regarding generator replacement with another defibrillator, with a downgrade to CRT-P or even foregoing generator replacement altogether.  The latter is definitely a consideration in view of the patient's deteriorating cognitive status and overall health situation and quality of life.  Grant Lawrence has told his daughters and his caretakers repeatedly that he is not happy.  He has told multiple people "I wish I would die".  He complains about his lack of freedom and autonomy.  His cognitive skills are declining.  He has lost a lot of weight and is frankly malnourished.  Unfortunately his wife has very similar neuropsychiatric issues.  They are both residents at the [place] [facility] in [facility].  Unfortunately, both the patient's daughters and his primary care provider agree that complex issues regarding generator change-out or choice of device are issues that he can no longer comprehend or have a reasonable ability to pass judgment upon.  He has recently shown some problems with periods of paranoid ideation and suspicion.  The discussion is even harder due to the inability to have a face-to-face interaction during the coronavirus pandemic.  He has atrial fibrillation status post AV node ablation with a St. Jude Unify dual-chamber biventricular pacemaker/defibrillator implanted in October 2010,  generator changeout in 2015.  He had a myocardial infarction in 1999. He had severe mitral insufficiency. Cardiac catheterization in July 2005 showed a 70-80% stenosis of the LAD and 80-90% ostial  diagonal stenosis. In August 2005 he underwent bypass surgery and mitral valve annuloplasty repair. He has moderate tricuspid insufficiency, mild to moderate aortic insufficiency, pulmonary arterial hypertension.He carries a diagnosis of chronic compensated diastolic heart failure  His most recent echocardiogram in September 2015 showed an improved ejection fraction to 50-55% (at time of ICD implantation was 25% and had been 45% in September 2011, 55% in 2014) and mild mitral insufficiency. Mean transvalvular gradient was 2.1 mm Hg. He had moderate aortic insufficiency and tricuspid insufficiency. Left atrium 5.6 cm. LV end systolic diameter 3.6 cm. sPAP 28 mm Hg.  He has a history of systemic hypertension and mixed hyperlipidemia but has never smoked and does not have diabetes mellitus. He does not tolerate statins and is on Zetia monotherapy.  The patient does not have symptoms concerning for COVID-19 infection (fever, chills, cough, or new shortness of breath).    Past Medical History:  Diagnosis Date   A-fib ([place])    Angina pectoris ([place])    Asymptomatic microscopic hematuria    Cardiac defibrillator in place    Cardiac pacemaker    CHF (congestive heart failure) ([place])    Coronary artery disease    Dementia ([place])    Hyperlipidemia    Hypertension    Long term (current) use of anticoagulants    Past Surgical History:  Procedure Laterality Date   CARDIAC DEFIBRILLATOR PLACEMENT  09/26/2009   CORONARY ARTERY BYPASS GRAFT  2005   CORONARY ARTERY BYPASS GRAFT     IMPLANTABLE CARDIOVERTER DEFIBRILLATOR (ICD) GENERATOR CHANGE N/A 08/23/2014   Procedure: ICD GENERATOR CHANGE;  Surgeon: Grant Klein, MD;  Location: [facility];  Service: Cardiovascular;  Laterality: N/A;   MITRAL VALVE REPAIR (MV)/CORONARY ARTERY BYPASS GRAFTING (CABG)     MITRAL VALVULOPLASTY  2005     Current Meds  Medication Sig   acetaminophen (TYLENOL) 325 MG tablet Take 650 mg by mouth every  4 (four) hours as needed (pain). Do not exceed 3g in 24 hours   apixaban (ELIQUIS) 5 MG TABS tablet Take 5 mg by mouth 2 (two) times daily.   Benzocaine-Menthol (CHLORASEPTIC SORE THROAT MT) Use as directed 2 sprays in the mouth or throat every 6 (six) hours as needed (sore throat).   carvedilol (COREG) 3.125 MG tablet TAKE ONE TABLET BY MOUTH TWICE DAILY WITH MEALS (Patient taking differently: Take 3.125 mg by mouth 2 (two) times daily. )   divalproex (DEPAKOTE) 125 MG DR tablet Take 125 mg by mouth 2 (two) times daily.    docusate sodium (COLACE) 100 MG capsule Take 100 mg by mouth 2 (two) times daily.   donepezil (ARICEPT) 10 MG tablet Take 10 mg by mouth at bedtime.    ezetimibe (ZETIA) 10 MG tablet Take 1 tablet (10 mg total) daily by mouth. (Patient taking differently: Take 10 mg by mouth at bedtime. )   finasteride (PROSCAR) 5 MG tablet Take 5 mg by mouth daily.   LORazepam (ATIVAN) 0.5 MG tablet Take 0.5 mg by mouth every 12 (twelve) hours as needed for anxiety.   Menthol-Zinc Oxide (CALMOSEPTINE) 0.44-20.6 % OINT Apply 1 application topically daily as needed (redness).   mirtazapine (REMERON) 15 MG tablet Take 15 mg by mouth at bedtime.    NUTRITIONAL SUPPLEMENT LIQD Take 120 mLs by mouth 3 (three)  times daily. House supplement   nystatin (NYSTATIN) powder Apply 1 g topically every 6 (six) hours as needed (rash).    tamsulosin (FLOMAX) 0.4 MG CAPS capsule Take 0.4 mg by mouth at bedtime.    traZODone (DESYREL) 50 MG tablet Take 12.5 mg by mouth at bedtime.     Allergies:   Rosuvastatin calcium and Statins   Social History   Tobacco Use   Smoking status: Never Smoker   Smokeless tobacco: Never Used  Substance Use Topics   Alcohol use: No   Drug use: No     Family Hx: The patient's family history includes Cancer in his father; Diabetes in his brother.  ROS:   Please see the history of present illness.    Very limited review of systems from the patient, but  is been obtained from the caregivers.  He does not have shortness of breath, angina, syncope or edema All other systems reviewed and are negative.   Prior CV studies:   The following studies were reviewed today:  Most recent CRT-D download on March 25, 2019 and most recent echocardiogram 2015  Labs/Other Tests and Data Reviewed:    EKG:  Intracardiac electrogram from his device February 16 shows atrial fibrillation with biventricular pacing.  Recent Labs: 01/12/2019: ALT 18 01/14/2019: BUN 17; Creatinine, Ser 0.87; Hemoglobin 12.7; Magnesium 1.8; Platelets 103; Potassium 4.2; Sodium 142   Recent Lipid Panel Lab Results  Component Value Date/Time   CHOL 172 06/24/2016 08:23 AM   TRIG 62.0 06/24/2016 08:23 AM   HDL 53.20 06/24/2016 08:23 AM   CHOLHDL 3 06/24/2016 08:23 AM   LDLCALC 106 (H) 06/24/2016 08:23 AM    Wt Readings from Last 3 Encounters:  11/17/18 144 lb (65.3 kg)  08/29/18 146 lb 13.2 oz (66.6 kg)  03/04/18 138 lb 6.4 oz (62.8 kg)     Objective:    Vital Signs:  There were no vitals taken for this visit.   VITAL SIGNS:  reviewed GEN:  Appears chronically ill and malnourished RESPIRATORY:  normal respiratory effort, symmetric expansion NEURO:  Disoriented, intermittently agitated.  Confused about the video conference PSYCH:  Irritable    ASSESSMENT & PLAN:    1. CHF (chronic diastolic): Very hard to tell during this limited video conferencing, but does not appear to have problems with heart failure either clinically or by thoracic impedance monitoring.  His left ventricular ejection fraction normalized after AV node ablation and biventricular pacing 2. CHB: He is pacemaker dependent following AV node ablation.  This makes her some very difficult decisions regarding device change. 3. AFib: Permanent. CHADSVasc 4 (age 78, HF, CAD). Severely dilated left atrium. Continue anticoagulation. 4. CRT-D approaching ERI: Device function is currently normal but elective  replacement indicator is anticipated in the next 3 months.  He is device dependent due to complete heart block.  He does not require ICD therapy since EF has improved and is never really required tachy arrhythmia intervention.  I would definitely recommend at least a downgrade to CRT-pacemaker.  Both his daughters are clearly in agreement and I am sure the patient would agree as well.  He has clearly expressed the desire not to have life-prolonging interventions.  The decision whether or not to proceed with a generator change at all is a much more difficult plan.  The daughters are wary of any surgical intervention due to the pain involved, although I did explain that this is a fairly small surgical procedure that can be done with local  anesthesia.  Having said that, his quality of life is very poor and he has expressed a desire not to have life-prolonging interventions.  I discussed the natural behavior of a pacemaker generator as it reaches ERI and then end of service.  It is unlikely he would have a sudden demise but rather a gradual decline due to bradycardia.  His daughters were not ready to make a decision regarding this today, they are very conflicted and emotional.  They clearly care about their father, but also hate the fact that he has lost so much of his quality of life.  The inability to discuss this directly with the patient makes it much harder.  I advised him to approach this from the patient's point of view.  Would he consider his current living situation were the end dignified or when he rather not persist?  They will talk amongst themselves and try to talk to their father as well.  Clearly his cognitive decline is such that they will eventually have to make this decision for him.  Since he is device dependent, the decision is of great magnitude and I am not sure there is a right wrong answer from an ethical point of view but rather they should think what is right or wrong for their father as an  individual, based on his values and his previous expressed wishes.  Regardless, we will ask the pacemaker representative to turn off antitachycardia and defibrillation therapies.  It is clear that everybody is in agreement with that. 5. CAD s/p CABG 2005: Asymptomatic.  No angina since bypass surgery 6. MR s/p annuloplasty: There was only mild residual regurgitation at his last echo. 7. Moderate tricuspid insufficiency: Quite possibly related to the multiple agents crossing the tricuspid valve.   8. Moderate aortic insufficiency: Unable to evaluate today.  COVID-19 Education: The signs and symptoms of COVID-19 were discussed with the patient and how to seek care for testing (follow up with PCP or arrange E-visit).  The importance of social distancing was discussed today.  Time:   Today, I have spent 45 minutes with the patient, his family/power of attorney and his caregivers with telehealth technology discussing the above problems.     Medication Adjustments/Labs and Tests Ordered: Current medicines are reviewed at length with the patient today.  Concerns regarding medicines are outlined above.   Tests Ordered: No orders of the defined types were placed in this encounter.   Medication Changes: No orders of the defined types were placed in this encounter.   Disposition:  Follow up 3 months  Signed, Grant Klein, MD  04/13/2019 8:00 PM    [place]

## 2019-04-13 NOTE — Telephone Encounter (Signed)
Tie, Your hard work paid off. We successfully had a FIVE-WAY webex meeting that included the patient with a nurse, the patient's two daughters (two separate locations), the primary care provider Clide Deutscher) and myself. It was quite a thing, but I believe it helped. Thanks a bunch! Dr. Loletha Grayer

## 2019-04-13 NOTE — Patient Instructions (Addendum)
Medication Instructions:  Continue same medications If you need a refill on your cardiac medications before your next appointment, please call your pharmacy.   Lab work: None ordered   Testing/Procedures: None ordered  Follow-Up: At [facility], you and your health needs are our priority.  As part of our continuing mission to provide you with exceptional heart care, we have created designated Provider Care Teams.  These Care Teams include your primary Cardiologist (physician) and Advanced Practice Providers (APPs -  Physician Assistants and Nurse Practitioners) who all work together to provide you with the care you need, when you need it. . Follow up appointment scheduled with Dr.Croitoru Thursday 07/22/19 at 1:40 pm    Dr.Croitoru will call St Jude and have device turned off  Call Dr.Croitoru back and let him know if you decide on generator change

## 2019-04-14 ENCOUNTER — Telehealth: Payer: Self-pay | Admitting: *Deleted

## 2019-04-14 NOTE — Telephone Encounter (Signed)
Spoke with [name] to get contact info for patient's facility. He resides at [facility]. Main number is [phone], director is Amy Martinique. [name] is aware that I will contact the facility on Friday (as after business hours today) to obtain their fax number, fax a copy of the order to deactivate ICD therapies, and setup a date/time for a St. Jude rep to perform reprogramming. Advised I will update her once this appointment has been scheduled. She verbalizes agreement with plan and thanked me for my call.

## 2019-04-15 NOTE — Telephone Encounter (Signed)
Follow up:   Grant Lawrence returning your call back. Please call before 10:00 or after 10:30 am.

## 2019-04-16 NOTE — Telephone Encounter (Signed)
TY, sorry we went back and forth on this. MCr

## 2019-04-16 NOTE — Telephone Encounter (Signed)
Spoke with Amy at [facility]. Fax number is [phone], attn: Frankie. Amy reports Benjamine Mola, Therapist, sports, is at facility today. St. Jude rep can come by at any time.  Returned call to [employer]. She reports that she and her sister have changed their minds about the plan. They wish to keep ICD therapies enabled for now, with plan for downgrade to a CRT-P when device reaches ERI. Order has not yet been faxed to the facility. Advised I will call Amy back and update her. [name] verbalizes understanding and thanked me for my assistance.  Spoke with Amy, she is aware of change in plans. Bryan Small also made aware. Monthly remote battery checks already scheduled, next on 04/26/19.

## 2019-04-26 ENCOUNTER — Ambulatory Visit (INDEPENDENT_AMBULATORY_CARE_PROVIDER_SITE_OTHER): Payer: Medicare Other | Admitting: *Deleted

## 2019-04-26 ENCOUNTER — Other Ambulatory Visit: Payer: Self-pay

## 2019-04-26 DIAGNOSIS — I442 Atrioventricular block, complete: Secondary | ICD-10-CM

## 2019-04-27 ENCOUNTER — Telehealth: Payer: Self-pay

## 2019-04-27 NOTE — Telephone Encounter (Signed)
Left message for patient to remind of missed remote transmission.  

## 2019-04-28 LAB — CUP PACEART REMOTE DEVICE CHECK
Date Time Interrogation Session: 20200520102223
Implantable Lead Implant Date: 20101019
Implantable Lead Implant Date: 20101019
Implantable Lead Implant Date: 20101019
Implantable Lead Location: 753858
Implantable Lead Location: 753859
Implantable Lead Location: 753860
Implantable Lead Model: 1999
Implantable Lead Model: 7121
Implantable Pulse Generator Implant Date: 20150915
Pulse Gen Serial Number: 7207729

## 2019-05-04 ENCOUNTER — Encounter: Payer: Self-pay | Admitting: Cardiology

## 2019-05-04 NOTE — Progress Notes (Signed)
Remote ICD transmission.   

## 2019-05-13 ENCOUNTER — Telehealth: Payer: Self-pay | Admitting: Nurse Practitioner

## 2019-05-13 NOTE — Telephone Encounter (Signed)
Merlin alert for RRT as of 05/01/19. Will route to Dr C for timing of gen change.   Chanetta Marshall, NP 05/13/2019 8:51 AM

## 2019-05-13 NOTE — Telephone Encounter (Signed)
Thanks, Museum/gallery conservator Can we please schedule for next Wednesday, Lattie Haw?

## 2019-05-14 ENCOUNTER — Encounter: Payer: Self-pay | Admitting: *Deleted

## 2019-05-14 NOTE — Telephone Encounter (Signed)
Call placed to the patient's daughter to go over the instructions. They will also be sent to MyChart (please see letter for details) She has been advised to call if she has any further questions.  Courtney, the nurse at the assisted living, has put a STAT order on the BMET, CBC and Covid test. All results have been requested to be faxed to the office. She has been made aware that we would need the results by Tuesday morning.

## 2019-05-14 NOTE — Telephone Encounter (Signed)
Call placed to the patient's daughter, per the dpr. She has been made aware that the patient needs to be scheduled for a pacemaker gen change next Wednesday 6.10.2020 with Dr. Sallyanne Kuster.  The patient will need to have a BMET and CBC completed and a corona virus test done prior to the procedure. The daughter is going to call the patient's nurse at the facility to see if this can be completed there. She will have the nurse call me back today.

## 2019-05-17 ENCOUNTER — Telehealth: Payer: Self-pay | Admitting: Cardiovascular Disease

## 2019-05-17 NOTE — Telephone Encounter (Signed)
Pt is scheduled for a pacemaker gen change this Wednesday 6/10. Pt was instructed to hold Eiquis starting Sunday night. Elizabeth from [employer] called to inform that pt had one dose this morning. Will route to MD for recommendations.

## 2019-05-17 NOTE — Telephone Encounter (Signed)
New message:    Nurse from [facility] calling concering this patient Eliquis. Please call back at [phone]

## 2019-05-17 NOTE — Telephone Encounter (Signed)
That's perfectly fine, just hold it starting tonight MCr

## 2019-05-17 NOTE — Telephone Encounter (Signed)
Left message to call back  

## 2019-05-18 NOTE — Telephone Encounter (Signed)
Grant Lawrence, nurse at [facility], has been made aware and eliquis is being held.

## 2019-05-18 NOTE — Telephone Encounter (Signed)
Call placed to [facility], nurse at [place], to check on the lab work. She stated that she will have the results faxed to the office today. She stated that the coronavirus test was negative. A request will be made to have the results scanned into the patient's chart today for his procedure tomorrow.

## 2019-05-19 ENCOUNTER — Telehealth: Payer: Self-pay | Admitting: Cardiovascular Disease

## 2019-05-19 ENCOUNTER — Ambulatory Visit (HOSPITAL_COMMUNITY)
Admission: RE | Disposition: A | Discharge status: Skilled Nursing Facility | Payer: Medicare Other | Source: Home / Self Care | Attending: Cardiovascular Disease

## 2019-05-19 ENCOUNTER — Other Ambulatory Visit: Payer: Self-pay

## 2019-05-19 ENCOUNTER — Ambulatory Visit (HOSPITAL_COMMUNITY)
Admission: RE | Admit: 2019-05-19 | Discharge: 2019-05-19 | Disposition: A | Discharge status: Skilled Nursing Facility | Payer: Medicare Other | Attending: Cardiovascular Disease | Admitting: Cardiovascular Disease

## 2019-05-19 DIAGNOSIS — I2510 Atherosclerotic heart disease of native coronary artery without angina pectoris: Secondary | ICD-10-CM | POA: Diagnosis not present

## 2019-05-19 DIAGNOSIS — F0390 Unspecified dementia without behavioral disturbance: Secondary | ICD-10-CM | POA: Diagnosis not present

## 2019-05-19 DIAGNOSIS — I428 Other cardiomyopathies: Secondary | ICD-10-CM | POA: Insufficient documentation

## 2019-05-19 DIAGNOSIS — Z951 Presence of aortocoronary bypass graft: Secondary | ICD-10-CM | POA: Insufficient documentation

## 2019-05-19 DIAGNOSIS — Z79899 Other long term (current) drug therapy: Secondary | ICD-10-CM | POA: Diagnosis not present

## 2019-05-19 DIAGNOSIS — I442 Atrioventricular block, complete: Secondary | ICD-10-CM | POA: Diagnosis not present

## 2019-05-19 DIAGNOSIS — I110 Hypertensive heart disease with heart failure: Secondary | ICD-10-CM | POA: Diagnosis not present

## 2019-05-19 DIAGNOSIS — I5032 Chronic diastolic (congestive) heart failure: Secondary | ICD-10-CM | POA: Diagnosis not present

## 2019-05-19 DIAGNOSIS — Z006 Encounter for examination for normal comparison and control in clinical research program: Secondary | ICD-10-CM | POA: Diagnosis not present

## 2019-05-19 DIAGNOSIS — Z4502 Encounter for adjustment and management of automatic implantable cardiac defibrillator: Secondary | ICD-10-CM | POA: Diagnosis not present

## 2019-05-19 DIAGNOSIS — I25810 Atherosclerosis of coronary artery bypass graft(s) without angina pectoris: Secondary | ICD-10-CM | POA: Diagnosis present

## 2019-05-19 DIAGNOSIS — E782 Mixed hyperlipidemia: Secondary | ICD-10-CM | POA: Diagnosis not present

## 2019-05-19 DIAGNOSIS — I2721 Secondary pulmonary arterial hypertension: Secondary | ICD-10-CM | POA: Diagnosis not present

## 2019-05-19 DIAGNOSIS — Z45010 Encounter for checking and testing of cardiac pacemaker pulse generator [battery]: Secondary | ICD-10-CM | POA: Diagnosis not present

## 2019-05-19 DIAGNOSIS — I4821 Permanent atrial fibrillation: Secondary | ICD-10-CM | POA: Diagnosis not present

## 2019-05-19 DIAGNOSIS — I252 Old myocardial infarction: Secondary | ICD-10-CM | POA: Diagnosis not present

## 2019-05-19 DIAGNOSIS — Z888 Allergy status to other drugs, medicaments and biological substances status: Secondary | ICD-10-CM | POA: Insufficient documentation

## 2019-05-19 DIAGNOSIS — Z95810 Presence of automatic (implantable) cardiac defibrillator: Secondary | ICD-10-CM | POA: Diagnosis present

## 2019-05-19 DIAGNOSIS — Z7901 Long term (current) use of anticoagulants: Secondary | ICD-10-CM | POA: Diagnosis not present

## 2019-05-19 HISTORY — PX: BIV PACEMAKER GENERATOR CHANGEOUT: EP1198

## 2019-05-19 LAB — SURGICAL PCR SCREEN
MRSA, PCR: NEGATIVE
Staphylococcus aureus: NEGATIVE

## 2019-05-19 SURGERY — BIV PACEMAKER GENERATOR CHANGEOUT

## 2019-05-19 MED ORDER — ACETAMINOPHEN 325 MG PO TABS: 325.0000 mg | ORAL_TABLET | ORAL | Status: DC | PRN

## 2019-05-19 MED ORDER — SODIUM CHLORIDE 0.9 % IV SOLN
80.0000 mg | INTRAVENOUS | Status: AC
  Administered 2019-05-19: 80 mg
  Filled 2019-05-19: qty 2

## 2019-05-19 MED ORDER — CEFAZOLIN SODIUM-DEXTROSE 2-4 GM/100ML-% IV SOLN
INTRAVENOUS | Status: AC
  Filled 2019-05-19: qty 100

## 2019-05-19 MED ORDER — LIDOCAINE HCL 1 % IJ SOLN
INTRAMUSCULAR | Status: AC
  Filled 2019-05-19: qty 60

## 2019-05-19 MED ORDER — LIDOCAINE HCL (PF) 1 % IJ SOLN
INTRAMUSCULAR | Status: DC | PRN
  Administered 2019-05-19: 50 mL

## 2019-05-19 MED ORDER — APIXABAN 5 MG PO TABS: 5.0000 mg | ORAL_TABLET | Freq: Two times a day (BID) | ORAL | Status: AC

## 2019-05-19 MED ORDER — SODIUM CHLORIDE 0.9 % IV SOLN
INTRAVENOUS | Status: DC
  Administered 2019-05-19: 14:00:00 via INTRAVENOUS

## 2019-05-19 MED ORDER — CHLORHEXIDINE GLUCONATE 4 % EX LIQD
60.0000 mL | Freq: Once | CUTANEOUS | Status: DC
  Filled 2019-05-19: qty 60

## 2019-05-19 MED ORDER — SODIUM CHLORIDE 0.9% FLUSH: 3.0000 mL | Freq: Two times a day (BID) | INTRAVENOUS | Status: DC

## 2019-05-19 MED ORDER — CEFAZOLIN SODIUM-DEXTROSE 2-4 GM/100ML-% IV SOLN
2.0000 g | INTRAVENOUS | Status: AC
  Administered 2019-05-19: 2 g via INTRAVENOUS
  Filled 2019-05-19: qty 100

## 2019-05-19 MED ORDER — MUPIROCIN 2 % EX OINT
TOPICAL_OINTMENT | CUTANEOUS | Status: AC
  Filled 2019-05-19: qty 22

## 2019-05-19 MED ORDER — MUPIROCIN 2 % EX OINT
1.0000 "application " | TOPICAL_OINTMENT | Freq: Once | CUTANEOUS | Status: AC
  Administered 2019-05-19: 1 via TOPICAL
  Filled 2019-05-19: qty 22

## 2019-05-19 MED ORDER — SODIUM CHLORIDE 0.9% FLUSH: 3.0000 mL | INTRAVENOUS | Status: DC | PRN

## 2019-05-19 MED ORDER — SODIUM CHLORIDE 0.9 % IV SOLN
INTRAVENOUS | Status: AC
  Filled 2019-05-19: qty 2

## 2019-05-19 MED ORDER — SODIUM CHLORIDE 0.9 % IV SOLN: 250.0000 mL | INTRAVENOUS | Status: DC | PRN

## 2019-05-19 MED ORDER — ONDANSETRON HCL 4 MG/2ML IJ SOLN: 4.0000 mg | Freq: Four times a day (QID) | INTRAMUSCULAR | Status: DC | PRN

## 2019-05-19 SURGICAL SUPPLY — 6 items
CABLE SURGICAL S-101-97-12 (CABLE) ×3 IMPLANT
PACEMAKER ALLR CRT-P RF PM3222 (Pacemaker) ×1 IMPLANT
PAD PRO RADIOLUCENT 2001M-C (PAD) ×3 IMPLANT
POUCH AIGIS-R ANTIBACT PPM (Mesh General) ×3 IMPLANT
PPM ALLURE CRT-P RF PM3222 (Pacemaker) ×3 IMPLANT
TRAY PACEMAKER INSERTION (PACKS) ×3 IMPLANT

## 2019-05-19 NOTE — Op Note (Signed)
Procedure report  Procedure performed:  1. Device generator changeout: downgrade from CRT-D to CRT-P  2. Removal of ICD generator 3. TyRx pouch Reason for procedure:  1. Device generator at elective replacement interval  2. Complete heart block Procedure performed by:  Sanda Klein, MD  Complications:  None  Estimated blood loss:  <5 mL  Medications administered during procedure:  Ancef 2 g intravenously, lidocaine 1% 30 mL locally,  Device details:   [employer]. Jude Allure RF model number 3222, serial number [id number] Right atrial lead (chronic) St. Jude OptiSense, model number 1999, serial [id number] (implanted 09/26/2009) Right ventricular lead (chronic)  St. Jude , model number P4782202, serial number [id number] (implanted 09/26/2009) Left ventricular lead  St. Jude QuickFlex, model number 1258T, serial number [id number] (implanted 09/26/2009) Explanted generator St. [id number], serial number  [id number] (implanted 08/23/2014)  Procedure details:  After the risks and benefits of the procedure were discussed the patient provided informed consent. She was brought to the cardiac catheter lab in the fasting state. The patient was prepped and draped in usual sterile fashion. Local anesthesia with 1% lidocaine was administered to to the left infraclavicular area. A 5-6cm horizontal incision was made parallel with and 2-3 cm caudal to the left clavicle, in the area of an old scar. An older scar was seen closer to the left clavicle. Using minimal electrocautery and mostly sharp and blunt dissection the prepectoral pocket was opened carefully to avoid injury to the loops of chronic leads. Extensive dissection was not necessary. The device was explanted. The pocket was carefully inspected for hemostasis and flushed with copious amounts of antibiotic solution.  The leads were disconnected from the old generator and testing of the lead parameters later showed excellent values. The new  generator was connected to the chronic leads, with appropriate pacing noted.   The entire system was then carefully inserted in the pocket with care been taking that the leads and device assumed a comfortable position without pressure on the incision. Great care was taken that the leads be located deep to the generator. The pocket was then closed in layers using 2 layers of 2-0 Vicryl and cutaneous staples after which a sterile dressing was applied.   At the end of the procedure the following lead parameters were encountered:   Right atrial lead sensed P waves none (afib),   Right ventricular lead sensed R waves  none impedance 460 ohms, threshold 0.75 at 0.5 ms pulse width.  Left ventricular lead impedance 630 ohms, threshold 2.0 at 0.8 ms pulse width.

## 2019-05-19 NOTE — H&P (Signed)
Cardiology Admission History and Physical:   Patient ID: Grant Lawrence MRN: [id number]; DOB: 27-Dec-1930   Admission date: 05/19/2019  Primary Care Provider: Orvis Brill, [facility] Primary Cardiologist: Sanda Klein, MD  Primary Electrophysiologist:  None   Chief Complaint:  CRT generator at [facility]; complete heart block  Patient Profile:   Grant Lawrence is a 85 y.o. male with complete heart block status post AV node ablation who was CRT-D generator has reached RRT.  History of Present Illness:   [name] has a longstanding nonischemic cardiomyopathy with recovered left ventricular ejection fraction on chronic diastolic heart failure, complete heart block following AV node ablation, permanent atrial fibrillation, CRT hyper responder.  His device reached elective replacement indicator earlier this month.  This was anticipated and we have ordered he had a lengthy and detailed discussion about next steps with the patient and his daughters and his primary care provider.  He has deteriorating quality of life due to cognitive deficits and is now 85 years old. He is a permanent resident in a nursing facility, His defibrillator has never delivered tachycardia therapies.  He is device dependent and does not have an underlying escape rhythm.  We discussed options of changing out with a similar defibrillator device, downgrading to a biventricular pacemaker, or doing nothing at all, and allowing the pacemaker battery to gradually decline to the point that it would not provide ventricular pacing.  The patient's daughters, who have medical power of attorney, agree with downgrading to a pacemaker, but did not want to contemplate not changing out the battery, since they understood this would eventually lead to [name]'s demise.  They acknowledged that his quality of life is poor, but they believe that he would want a generator changeout.  He has atrial fibrillation status post AV node  ablation with a [employer] Unify dual-chamber biventricular pacemaker/defibrillator implanted in October 2010, generator changeout in 2015.  He had a myocardial infarction in 1999. He had severe mitral insufficiency. Cardiac catheterization in July 2005 showed a 70-80% stenosis of the LAD and 80-90% ostial diagonal stenosis. In August 2005 he underwent bypass surgery and mitral valve annuloplasty repair. He has moderate tricuspid insufficiency, mild to moderate aortic insufficiency,pulmonary arterial hypertension.He carries a diagnosis of chronic compensated diastolic heart failure  His most recent echocardiogram in September 2015 showed an improved ejection fraction to 50-55% (at time of ICD implantation was 25% and had been 45% in September 2011, 55% in 2014) and mild mitral insufficiency. Mean transvalvular gradient was 2.1 mm Hg. He had moderate aortic insufficiency and tricuspid insufficiency. Left atrium 5.6 cm. LV end systolic diameter 3.6 cm. sPAP 28 mm Hg.  He has a history of systemic hypertension and mixed hyperlipidemia but has never smoked and does not have diabetes mellitus. He does not tolerate statins and is on Zetia monotherapy.  Past Medical History:  Diagnosis Date  . A-fib ([place])   . Angina pectoris ([place])   . Asymptomatic microscopic hematuria   . Cardiac defibrillator in place   . Cardiac pacemaker   . CHF (congestive heart failure) ([place])   . Coronary artery disease   . Dementia ([place])   . Hyperlipidemia   . Hypertension   . Long term (current) use of anticoagulants     Past Surgical History:  Procedure Laterality Date  . CARDIAC DEFIBRILLATOR PLACEMENT  09/26/2009  . CORONARY ARTERY BYPASS GRAFT  2005  . CORONARY ARTERY BYPASS GRAFT    . IMPLANTABLE CARDIOVERTER DEFIBRILLATOR (ICD) GENERATOR CHANGE N/A  08/23/2014   Procedure: ICD GENERATOR CHANGE;  Surgeon: Sanda Klein, MD;  Location: [facility] CATH LAB;  Service: Cardiovascular;  Laterality: N/A;  . MITRAL VALVE REPAIR  (MV)/CORONARY ARTERY BYPASS GRAFTING (CABG)    . MITRAL VALVULOPLASTY  2005     Medications Prior to Admission: Prior to Admission medications   Medication Sig Start Date End Date Taking? Authorizing Provider  acetaminophen (TYLENOL) 325 MG tablet Take 650 mg by mouth every 4 (four) hours as needed (pain). Do not exceed 3g in 24 hours   Yes [provider]  apixaban (ELIQUIS) 5 MG TABS tablet Take 5 mg by mouth 2 (two) times daily.   Yes [provider]  brimonidine (ALPHAGAN) 0.2 % ophthalmic solution Place 1 drop into both eyes 3 (three) times daily.   Yes [provider]  carvedilol (COREG) 3.125 MG tablet TAKE ONE TABLET BY MOUTH TWICE DAILY WITH MEALS Patient taking differently: Take 3.125 mg by mouth 2 (two) times daily.  08/25/17  Yes Marletta Lor, MD  divalproex (DEPAKOTE) 125 MG DR tablet Take 125 mg by mouth 2 (two) times daily.    Yes [provider]  docusate sodium (COLACE) 100 MG capsule Take 100 mg by mouth 2 (two) times daily as needed for mild constipation.    Yes [provider]  donepezil (ARICEPT) 10 MG tablet Take 10 mg by mouth at bedtime.  07/08/18  Yes [provider]  ezetimibe (ZETIA) 10 MG tablet Take 1 tablet (10 mg total) daily by mouth. Patient taking differently: Take 10 mg by mouth at bedtime.  10/24/17  Yes Marletta Lor, MD  finasteride (PROSCAR) 5 MG tablet Take 5 mg by mouth daily.   Yes Festus Aloe, MD  Menthol-Zinc Oxide (CALMOSEPTINE) 0.44-20.6 % OINT Apply 1 application topically daily as needed (redness).   Yes [provider]  mirtazapine (REMERON) 15 MG tablet Take 15 mg by mouth at bedtime.    Yes [provider]  NUTRITIONAL SUPPLEMENT LIQD Take 120 mLs by mouth 3 (three) times daily. House supplement   Yes [provider]  nystatin (NYSTATIN) powder Apply 1 g topically every 6 (six) hours as needed (rash).    Yes [provider]  phenol  (CHLORASEPTIC) 1.4 % LIQD Use as directed 2 sprays in the mouth or throat every 6 (six) hours as needed for throat irritation / pain.   Yes [provider]  Pramox-PE-Glycerin-Petrolatum (HEMORRHOIDAL) 1-0.25-14.4-15 % CREA Apply 1 application topically daily as needed (hemorrhoids).   Yes [provider]  tamsulosin (FLOMAX) 0.4 MG CAPS capsule Take 0.4 mg by mouth at bedtime.  03/28/17  Yes Festus Aloe, MD     Allergies:    Allergies  Allergen Reactions  . Rosuvastatin Calcium Other (See Comments)    Myalgia  . Statins Other (See Comments)    Myalgia    Social History:   Social History   Socioeconomic History  . Marital status: Married    Spouse name: Not on file  . Number of children: Not on file  . Years of education: Not on file  . Highest education level: Not on file  Occupational History  . Not on file  Social Needs  . Financial resource strain: Not on file  . Food insecurity:    Worry: Not on file    Inability: Not on file  . Transportation needs:    Medical: Not on file    Non-medical: Not on file  Tobacco Use  .  Smoking status: Never Smoker  . Smokeless tobacco: Never Used  Substance and Sexual Activity  . Alcohol use: No  . Drug use: No  . Sexual activity: Not on file  Lifestyle  . Physical activity:    Days per week: Not on file    Minutes per session: Not on file  . Stress: Not on file  Relationships  . Social connections:    Talks on phone: Not on file    Gets together: Not on file    Attends religious service: Not on file    Active member of club or organization: Not on file    Attends meetings of clubs or organizations: Not on file    Relationship status: Not on file  . Intimate partner violence:    Fear of current or ex partner: Not on file    Emotionally abused: Not on file    Physically abused: Not on file    Forced sexual activity: Not on file  Other Topics Concern  . Not on file  Social History Narrative  . Not  on file    Family History:   The patient's family history includes Cancer in his father; Diabetes in his brother.    ROS:  Please see the history of present illness.  All other ROS reviewed and negative.     Physical Exam/Data:   Vitals:   05/19/19 1241  BP: (!) 144/66  Pulse: 74  Temp: 97.8 F (36.6 C)  SpO2: 99%  Weight: 65.8 kg  Height: 5\' 10"  (1.778 m)   No intake or output data in the 24 hours ending 05/19/19 1301 Last 3 Weights 05/19/2019 11/17/2018 08/29/2018  Weight (lbs) 145 lb 144 lb 146 lb 13.2 oz  Weight (kg) 65.772 kg 65.318 kg 66.6 kg     Body mass index is 20.81 kg/m.  General:  Well nourished, well developed, in no acute distress.  Alert, but only partially oriented HEENT: normal Lymph: no adenopathy Neck: no JVD Endocrine:  No thryomegaly Vascular: No carotid bruits; FA pulses 2+ bilaterally without bruits  Cardiac:  normal S1, S2; RRR; no murmur  Lungs:  clear to auscultation bilaterally, no wheezing, rhonchi or rales  Abd: soft, nontender, no hepatomegaly  Ext: no edema Musculoskeletal:  No deformities, BUE and BLE strength normal and equal Skin: warm and dry  Neuro:  CNs 2-12 intact, no focal abnormalities noted Psych:  Normal affect    EKG:  The ECG that was done was personally reviewed and demonstrates atrial fibrillation and biventricular pacing  Relevant CV Studies: Comprehensive device check  Laboratory Data:  Labs performed at the nursing facility, including coronavirus screening which was negative. Radiology/Studies:  No results found.  Assessment and Plan:   1. After lengthy and detailed discussions, we have decided to proceed with downgrade from CRT-D to CRT-P generator change out today.  Since he is device dependent, will use a TyRx pouch as well. This procedure has been fully reviewed with the patient and written informed consent has been obtained.  For questions or updates, please contact [place] Please consult  [email or web address] for contact info under        Signed, Sanda Klein, MD  05/19/2019 1:01 PM

## 2019-05-19 NOTE — Telephone Encounter (Signed)
New Message      Grant Lawrence is calling from [facility] and she said the pts daughter called her and told her the pt would be discharged today  Grant Lawrence is concerned because they are an assisted living community and she would like to know more about the discharge details for the pt    Please call back

## 2019-05-19 NOTE — Discharge Instructions (Signed)
Supplemental Discharge Instructions for  Pacemaker/Defibrillator Patients  Activity No restrictions except as advised in wound care.. DO wear your seatbelt, even if it crosses over the pacemaker site.  WOUND CARE - Keep the wound area clean and dry.  Remove the dressing the day after you return home (24 hours after the procedure). - DO NOT SUBMERGE UNDER WATER UNTIL FULLY HEALED (no tub baths, hot tubs, swimming pools, etc.).  - You  may shower or take a sponge bath after the dressing is removed. DO NOT SOAK the area and do not allow the shower to directly spray on the site. - If you have tape/steri-strips on your wound, these will fall off; do not pull them off prematurely.   - No bandage is needed on the site.  DO  NOT apply any creams, oils, or ointments to the wound area. - If you notice any drainage or discharge from the wound, any swelling, excessive redness or bruising at the site, or if you develop a fever > 101? F after you are discharged home, call the office at once.  Special Instructions - You are still able to use cellular telephones.  Avoid carrying your cellular phone near your device. - When traveling through airports, show security personnel your identification card to avoid being screened in the metal detectors.  - Avoid arc welding equipment, MRI testing (magnetic resonance imaging), TENS units (transcutaneous nerve stimulators).  Call the office for questions about other devices. - Avoid electrical appliances that are in poor condition or are not properly grounded. - Microwave ovens are safe to be near or to operate.

## 2019-05-19 NOTE — Telephone Encounter (Signed)
Spoke with Grant Lawrence, she is asking for specific details about the procedure and the discharge instructions. She is wanting to make sure they will be able to provide the care the patient will need. Will forward to dr croitoru for details.

## 2019-05-19 NOTE — Telephone Encounter (Signed)
Called [facility] and agve post-procedure instructions, which will also be printed on AVS.

## 2019-05-20 ENCOUNTER — Telehealth: Payer: Self-pay

## 2019-05-20 ENCOUNTER — Encounter (HOSPITAL_COMMUNITY): Payer: Self-pay | Admitting: Cardiovascular Disease

## 2019-05-20 MED FILL — Lidocaine HCl Local Inj 1%: INTRAMUSCULAR | Qty: 60 | Status: AC

## 2019-05-20 NOTE — Telephone Encounter (Signed)
Scheduled pt for virtual wound check 06/01/19 @ 1530.

## 2019-05-28 ENCOUNTER — Telehealth: Payer: Self-pay | Admitting: *Deleted

## 2019-05-28 ENCOUNTER — Telehealth: Payer: Self-pay

## 2019-05-28 NOTE — Telephone Encounter (Signed)
Spoke with [name], patient's daughter (DPR). Discussed upcoming virtual wound check visit. [name] reports she and her sister do not need to be a part of this visit. She reports that [facility] prefers FaceTime. Advised I will contact them directly to confirm virtual visit instructions. Director is Brayton El, [phone].  Spoke with Tharon Aquas, patient's nurse at [facility]. Preferred method is FaceTime, Frankie's number is [phone]. Appointment notes updated.

## 2019-05-28 NOTE — Telephone Encounter (Signed)
Appointment 06-01-2019 at 3:30 pm.      COVID-19 Pre-Screening Questions:  . In the past 7 to 10 days have you had a cough,  shortness of breath, headache, congestion, fever (100 or greater) body aches, chills, sore throat, or sudden loss of taste or sense of smell? . Have you been around anyone with known Covid 19. . Have you been around anyone who is awaiting Covid 19 test results in the past 7 to 10 days? . Have you been around anyone who has been exposed to Covid 19, or has mentioned symptoms of Covid 19 within the past 7 to 10 days?  If you have any concerns/questions about symptoms patients report during screening (either on the phone or at [facility]). Contact the provider seeing the patient or DOD for further guidance.  If neither are available contact a member of the leadership team.   The pt visit is actually a virtual visit and I did not need to call the pt for this visit. I apologized to the pt daughter. She was very understanding.

## 2019-06-01 ENCOUNTER — Telehealth: Payer: Medicare Other | Admitting: *Deleted

## 2019-06-01 ENCOUNTER — Other Ambulatory Visit: Payer: Self-pay

## 2019-06-01 DIAGNOSIS — I442 Atrioventricular block, complete: Secondary | ICD-10-CM

## 2019-06-02 LAB — CUP PACEART INCLINIC DEVICE CHECK
Date Time Interrogation Session: 20200624072249
Implantable Lead Implant Date: 20101019
Implantable Lead Implant Date: 20101019
Implantable Lead Implant Date: 20101019
Implantable Lead Location: 753858
Implantable Lead Location: 753859
Implantable Lead Location: 753860
Implantable Lead Model: 1999
Implantable Lead Model: 7121
Implantable Pulse Generator Implant Date: 20200610
Pulse Gen Model: 3222
Pulse Gen Serial Number: 9090620

## 2019-06-02 NOTE — Progress Notes (Signed)
Virtual wound check appointment. Steri-strips removed. Wound without redness or edema. Bruising around incision, pt on eliquis. Incision edges appear approximated, difficult to fully visualize wound d/t image and video quality. Nurse at facility reports incision approximated, no swelling, drainage, redness. Wound well healed. Manual transmission requested. Patient educated about wound care, arm mobility, lifting restrictions. ROV 09/01/19 w/ Dr. Sallyanne Kuster. Will route to Dr. Sallyanne Kuster for review of incision.

## 2019-06-02 NOTE — Progress Notes (Signed)
Thanks, [employer]

## 2019-06-03 NOTE — Telephone Encounter (Signed)
I spoke with Grant Lawrence at [facility]. He states he will try to send the transmission with the pt monitor. I gave him my direct office number for him to call me if he needs help sending the pt transmission.

## 2019-06-07 ENCOUNTER — Telehealth: Payer: Self-pay

## 2019-06-07 ENCOUNTER — Ambulatory Visit (INDEPENDENT_AMBULATORY_CARE_PROVIDER_SITE_OTHER): Payer: Medicare Other | Admitting: *Deleted

## 2019-06-07 DIAGNOSIS — I5032 Chronic diastolic (congestive) heart failure: Secondary | ICD-10-CM

## 2019-06-07 NOTE — Telephone Encounter (Signed)
Transmission received.

## 2019-06-07 NOTE — Telephone Encounter (Signed)
Transmission received. No alerts, no episodes.

## 2019-06-07 NOTE — Telephone Encounter (Signed)
Spoke to staff at facility, states residents are eating breakfast. Requested manual transmission. Gave direct DC number if assistance is needed in sending transmission.

## 2019-06-07 NOTE — Telephone Encounter (Signed)
Spoke to [employer], assisted her in getting the transmission started and explained that the pt needed to remain within 6 ft of monitor until monitor turned off.

## 2019-06-07 NOTE — Telephone Encounter (Signed)
See phone note from 06/07/19.

## 2019-06-07 NOTE — Telephone Encounter (Signed)
Spoke to staff at facility, requested a call back later this afternoon if transmission was not received.

## 2019-06-16 NOTE — Progress Notes (Signed)
Remote pacemaker transmission.   

## 2019-07-22 ENCOUNTER — Ambulatory Visit: Payer: Medicare Other | Admitting: Cardiovascular Disease

## 2019-09-01 ENCOUNTER — Ambulatory Visit (INDEPENDENT_AMBULATORY_CARE_PROVIDER_SITE_OTHER): Payer: Medicare Other | Admitting: Cardiovascular Disease

## 2019-09-01 ENCOUNTER — Ambulatory Visit (INDEPENDENT_AMBULATORY_CARE_PROVIDER_SITE_OTHER): Payer: Medicare Other | Admitting: *Deleted

## 2019-09-01 ENCOUNTER — Other Ambulatory Visit: Payer: Self-pay

## 2019-09-01 ENCOUNTER — Encounter: Payer: Self-pay | Admitting: Cardiovascular Disease

## 2019-09-01 VITALS — BP 121/79 | HR 77 | Temp 97.3°F | Ht 70.0 in | Wt 139.2 lb

## 2019-09-01 DIAGNOSIS — I442 Atrioventricular block, complete: Secondary | ICD-10-CM | POA: Diagnosis not present

## 2019-09-01 DIAGNOSIS — I5032 Chronic diastolic (congestive) heart failure: Secondary | ICD-10-CM | POA: Diagnosis not present

## 2019-09-01 DIAGNOSIS — I361 Nonrheumatic tricuspid (valve) insufficiency: Secondary | ICD-10-CM

## 2019-09-01 DIAGNOSIS — I4821 Permanent atrial fibrillation: Secondary | ICD-10-CM

## 2019-09-01 DIAGNOSIS — Z45018 Encounter for adjustment and management of other part of cardiac pacemaker: Secondary | ICD-10-CM

## 2019-09-01 DIAGNOSIS — I25810 Atherosclerosis of coronary artery bypass graft(s) without angina pectoris: Secondary | ICD-10-CM

## 2019-09-01 DIAGNOSIS — Z9889 Other specified postprocedural states: Secondary | ICD-10-CM

## 2019-09-01 DIAGNOSIS — I351 Nonrheumatic aortic (valve) insufficiency: Secondary | ICD-10-CM

## 2019-09-01 NOTE — Progress Notes (Signed)
Cardiology office visit:   Patient ID: Grant Lawrence MRN: [id number]; DOB: 02/23/1931   Primary Care Provider: [facility] Primary Cardiologist: Sanda Klein, MD  Primary Electrophysiologist:  None   Chief Complaint:  complete heart block  Patient Profile:   Grant Lawrence is a 85 y.o. male with complete heart block status post AV node ablation who underwent device generator replacement on May 19, 2019 (downgraded from CRT-D to CRT-P)  History of Present Illness:   Grant Lawrence has a longstanding nonischemic cardiomyopathy with recovered left ventricular ejection fraction on chronic diastolic heart failure, complete heart block following AV node ablation, permanent atrial fibrillation, CRT hyper responder.  He has not had any cardiac problems since his device generator change out 3 months ago.  He has not had issues with shortness of breath or edema.  Just a few days ago he was discovered to have a fracture of the right wrist, presumably after a fall, although no fall was reported.  He just saw the orthopedic surgeon across the hall today.  Had reduced intake of food since the wrist fracture since he is right-handed.  His cognitive deficits remain the most striking problem.  He is a permanent resident in a nursing facility. The patient's daughters have medical power of attorney, and 1 of them accompanied him today.    Review of systems is therefore a problem but he does not report any shortness of breath with activity or orthopnea, no edema, no syncope.  He does not remember falling.  He has not had any bleeding problems and there is no evidence of head injury.  Pacemaker interrogation shows normal device function.  Anticipated generator longevity is 4.9-5.4 years and he has 99% biventricular pacing, occasional PVCs.  Some of these PVCs have lower voltage around 6.3 mV and his sensitivity was adjusted to allow a 2: 1 before.  The device is programmed VVIR.  The heart rate  histograms are very blunted, consistent with his very sedentary status.  His thoracic impedance shows that over the last week or so he is been "getting dry" with increasing impedance.  He has atrial fibrillation status post AV node ablation with a [employer] dual-chamber biventricular pacemaker/defibrillator implanted in October 2010, generator changeout in 2015, downgrade to [employer] RF CRT-P in June 2020.  He had a myocardial infarction in 1999. He had severe mitral insufficiency. Cardiac catheterization in July 2005 showed a 70-80% stenosis of the LAD and 80-90% ostial diagonal stenosis. In August 2005 he underwent bypass surgery and mitral valve annuloplasty repair. He has moderate tricuspid insufficiency, mild to moderate aortic insufficiency,pulmonary arterial hypertension.He carries a diagnosis of chronic compensated diastolic heart failure  His most recent echocardiogram in September 2015 showed an improved ejection fraction to 50-55% (at time of ICD implantation was 25% and had been 45% in September 2011, 55% in 2014) and mild mitral insufficiency. Mean transvalvular gradient was 2.1 mm Hg. He had moderate aortic insufficiency and tricuspid insufficiency. Left atrium 5.6 cm. LV end systolic diameter 3.6 cm. sPAP 28 mm Hg.  He has a history of systemic hypertension and mixed hyperlipidemia but has never smoked and does not have diabetes mellitus. He does not tolerate statins and is on Zetia monotherapy.  Past Medical History:  Diagnosis Date  . A-fib ([place])   . Angina pectoris ([place])   . Asymptomatic microscopic hematuria   . Cardiac defibrillator in place   . Cardiac pacemaker   . CHF (congestive heart failure) ([place])   .  Coronary artery disease   . Dementia ([place])   . Hyperlipidemia   . Hypertension   . Long term (current) use of anticoagulants     Past Surgical History:  Procedure Laterality Date  . BIV PACEMAKER GENERATOR CHANGEOUT N/A 05/19/2019   Procedure: BIV  PACEMAKER GENERATOR CHANGEOUT;  Surgeon: Sanda Klein, MD;  Location: [facility];  Service: Cardiovascular;  Laterality: N/A;  . CARDIAC DEFIBRILLATOR PLACEMENT  09/26/2009  . CORONARY ARTERY BYPASS GRAFT  2005  . CORONARY ARTERY BYPASS GRAFT    . IMPLANTABLE CARDIOVERTER DEFIBRILLATOR (ICD) GENERATOR CHANGE N/A 08/23/2014   Procedure: ICD GENERATOR CHANGE;  Surgeon: Sanda Klein, MD;  Location: [facility];  Service: Cardiovascular;  Laterality: N/A;  . MITRAL VALVE REPAIR (MV)/CORONARY ARTERY BYPASS GRAFTING (CABG)    . MITRAL VALVULOPLASTY  2005     Medications Prior to Admission: Prior to Admission medications   Medication Sig Start Date End Date Taking? Authorizing Provider  acetaminophen (TYLENOL) 325 MG tablet Take 650 mg by mouth every 4 (four) hours as needed (pain). Do not exceed 3g in 24 hours   Yes [provider]  apixaban (ELIQUIS) 5 MG TABS tablet Take 5 mg by mouth 2 (two) times daily.   Yes [provider]  brimonidine (ALPHAGAN) 0.2 % ophthalmic solution Place 1 drop into both eyes 3 (three) times daily.   Yes [provider]  carvedilol (COREG) 3.125 MG tablet TAKE ONE TABLET BY MOUTH TWICE DAILY WITH MEALS Patient taking differently: Take 3.125 mg by mouth 2 (two) times daily.  08/25/17  Yes Marletta Lor, MD  divalproex (DEPAKOTE) 125 MG DR tablet Take 125 mg by mouth 2 (two) times daily.    Yes [provider]  docusate sodium (COLACE) 100 MG capsule Take 100 mg by mouth 2 (two) times daily as needed for mild constipation.    Yes [provider]  donepezil (ARICEPT) 10 MG tablet Take 10 mg by mouth at bedtime.  07/08/18  Yes [provider]  ezetimibe (ZETIA) 10 MG tablet Take 1 tablet (10 mg total) daily by mouth. Patient taking differently: Take 10 mg by mouth at bedtime.  10/24/17  Yes Marletta Lor, MD  finasteride (PROSCAR) 5 MG tablet Take 5 mg by mouth daily.   Yes Festus Aloe, MD   Menthol-Zinc Oxide (CALMOSEPTINE) 0.44-20.6 % OINT Apply 1 application topically daily as needed (redness).   Yes [provider]  mirtazapine (REMERON) 15 MG tablet Take 15 mg by mouth at bedtime.    Yes [provider]  NUTRITIONAL SUPPLEMENT LIQD Take 120 mLs by mouth 3 (three) times daily. House supplement   Yes [provider]  nystatin (NYSTATIN) powder Apply 1 g topically every 6 (six) hours as needed (rash).    Yes [provider]  phenol (CHLORASEPTIC) 1.4 % LIQD Use as directed 2 sprays in the mouth or throat every 6 (six) hours as needed for throat irritation / pain.   Yes [provider]  Pramox-PE-Glycerin-Petrolatum (HEMORRHOIDAL) 1-0.25-14.4-15 % CREA Apply 1 application topically daily as needed (hemorrhoids).   Yes [provider]  tamsulosin (FLOMAX) 0.4 MG CAPS capsule Take 0.4 mg by mouth at bedtime.  03/28/17  Yes Festus Aloe, MD     Allergies:    Allergies  Allergen Reactions  . Rosuvastatin Calcium Other (See Comments)    Myalgia  . Statins Other (See Comments)    Myalgia    Social History:   Social History  Socioeconomic History  . Marital status: Married    Spouse name: Not on file  . Number of children: Not on file  . Years of education: Not on file  . Highest education level: Not on file  Occupational History  . Not on file  Social Needs  . Financial resource strain: Not on file  . Food insecurity    Worry: Not on file    Inability: Not on file  . Transportation needs    Medical: Not on file    Non-medical: Not on file  Tobacco Use  . Smoking status: Never Smoker  . Smokeless tobacco: Never Used  Substance and Sexual Activity  . Alcohol use: No  . Drug use: No  . Sexual activity: Not on file  Lifestyle  . Physical activity    Days per week: Not on file    Minutes per session: Not on file  . Stress: Not on file  Relationships  . Social Herbalist on phone: Not on file     Gets together: Not on file    Attends religious service: Not on file    Active member of club or organization: Not on file    Attends meetings of clubs or organizations: Not on file    Relationship status: Not on file  . Intimate partner violence    Fear of current or ex partner: Not on file    Emotionally abused: Not on file    Physically abused: Not on file    Forced sexual activity: Not on file  Other Topics Concern  . Not on file  Social History Narrative  . Not on file    Family History:   The patient's family history includes Cancer in his father; Diabetes in his brother.    ROS:  Please see the history of present illness.  All other ROS reviewed and negative.     Physical Exam/Data:   Vitals:   09/01/19 1152  BP: 121/79  Pulse: 77  Temp: (!) 97.3 F (36.3 C)  TempSrc: Temporal  Weight: 139 lb 3.2 oz (63.1 kg)  Height: 5\' 10"  (1.778 m)   No intake or output data in the 24 hours ending 05/19/19 1301 Last 3 Weights 09/01/2019 05/19/2019 11/17/2018  Weight (lbs) 139 lb 3.2 oz 145 lb 144 lb  Weight (kg) 63.141 kg 65.772 kg 65.318 kg     Body mass index is 19.97 kg/m.   General: Alert, oriented x3, no distress, he looks very slender, borderline malnourished.  Has temporal and hand muscle interdigital wasting.  Healthy left subclavian pacemaker site. Head: no evidence of trauma, PERRL, EOMI, no exophtalmos or lid lag, no myxedema, no xanthelasma; normal ears, nose and oropharynx Neck: normal jugular venous pulsations and no hepatojugular reflux; brisk carotid pulses without delay and no carotid bruits Chest: clear to auscultation, no signs of consolidation by percussion or palpation, normal fremitus, symmetrical and full respiratory excursions Cardiovascular: normal position and quality of the apical impulse, regular rhythm, normal first and paradoxically split second heart sounds, 2/6 systolic murmur at the right lower sternal border , no diastolic murmurs, rubs or  gallops Abdomen: no tenderness or distention, no masses by palpation, no abnormal pulsatility or arterial bruits, normal bowel sounds, no hepatosplenomegaly Extremities: no clubbing, cyanosis or edema; 2+ radial, ulnar and brachial pulses bilaterally; 2+ right femoral, posterior tibial and dorsalis pedis pulses; 2+ left femoral, posterior tibial and dorsalis pedis pulses; no subclavian or femoral bruits Neurological: grossly nonfocal Psych:  Normal mood and affect  EKG: Not ordered today.  The intracardiac electrogram shows 100% biventricular pacing, very rare PVCs  Relevant CV Studies: Comprehensive device check was performed in the office today  Laboratory Data:  BMET    Component Value Date/Time   NA 142 01/14/2019 0523   NA 141 03/27/2017   K 4.2 01/14/2019 0523   CL 105 01/14/2019 0523   CO2 26 01/14/2019 0523   GLUCOSE 102 (H) 01/14/2019 0523   BUN 17 01/14/2019 0523   BUN 19 03/27/2017   CREATININE 0.87 01/14/2019 0523   CREATININE 0.84 06/27/2015 0800   CALCIUM 9.4 01/14/2019 0523   GFRNONAA >60 01/14/2019 0523   GFRAA >60 01/14/2019 0523    Radiology/Studies:  No results found.  1. Chronic diastolic heart failure ([place])   2. Heart block AV complete ([facility])   3. Permanent atrial fibrillation   4. Biventricular pacemaker check   5. Coronary artery disease involving coronary bypass graft of native heart without angina pectoris   6. Status post mitral valve annuloplasty   7. Nonrheumatic tricuspid (valve) insufficiency   8. Nonrheumatic aortic (valve) insufficiency      Assessment and Plan:   1. CHF: He is not hypervolemic and in fact his thoracic impedance suggest that he is getting "dry".  Encouraged him to increase intake of food and fluids. 2. CHB: He is pacemaker dependent, status post AV node ablation. 3. AFib: Permanent arrhythmia.  CHADSVasc 4 (age 26, HF, CAD). On anticoagulation.  Watched carefully for increased frequency of falls. 4. CRT-P: Device function  is normal. 5. CAD s/p CABG 2005: No reports of angina.  6. MR s/p annuloplasty: There was only mild residual regurgitation at his last echo.  He does not have a holosystolic murmur at the apex. 7. Moderate tricuspid insufficiency: Quite possibly related to the multiple agents crossing the tricuspid valve.    No signs of right heart failure. 8. Moderate aortic insufficiency:  I did not appreciate a diastolic murmur today.  Patient Instructions  Medication Instructions:  Your physician recommends that you continue on your current medications as directed. Please refer to the Current Medication list given to you today.  If you need a refill on your cardiac medications before your next appointment, please call your pharmacy.   Lab work: None ordered If you have labs (blood work) drawn today and your tests are completely normal, you will receive your results only by: [place] (if you have MyChart) OR A paper copy in the mail If you have any lab test that is abnormal or we need to change your treatment, we will call you to review the results.  Testing/Procedures: None ordered  Follow-Up: At [facility], you and your health needs are our priority.  As part of our continuing mission to provide you with exceptional heart care, we have created designated Provider Care Teams.  These Care Teams include your primary Cardiologist (physician) and Advanced Practice Providers (APPs -  Physician Assistants and Nurse Practitioners) who all work together to provide you with the care you need, when you need it. You will need a follow up appointment in 12 months.  Please call our office 2 months in advance to schedule this appointment.  You may see Sanda Klein, MD or one of the following Advanced Practice Providers on your designated Care Team: Almyra Deforest, [place] Fabian Sharp, [place]      For questions or updates, please contact [facility] Please consult [email or web address] for contact info under  Signed, Sanda Klein, MD  09/01/2019 12:37 PM

## 2019-09-01 NOTE — Patient Instructions (Signed)

## 2019-09-02 LAB — CUP PACEART REMOTE DEVICE CHECK
Battery Remaining Longevity: 59 mo
Battery Remaining Percentage: 95.5 %
Battery Voltage: 2.96 V
Brady Statistic RV Percent Paced: 95 %
Date Time Interrogation Session: 20200924071307
Implantable Lead Implant Date: 20101019
Implantable Lead Implant Date: 20101019
Implantable Lead Implant Date: 20101019
Implantable Lead Location: 753858
Implantable Lead Location: 753859
Implantable Lead Location: 753860
Implantable Lead Model: 1999
Implantable Lead Model: 7121
Implantable Pulse Generator Implant Date: 20200610
Lead Channel Impedance Value: 430 Ohm
Lead Channel Impedance Value: 610 Ohm
Lead Channel Sensing Intrinsic Amplitude: 7.8 mV
Lead Channel Setting Pacing Amplitude: 2.5 V
Lead Channel Setting Pacing Amplitude: 3 V
Lead Channel Setting Pacing Pulse Width: 0.5 ms
Lead Channel Setting Pacing Pulse Width: 0.8 ms
Lead Channel Setting Sensing Sensitivity: 2.5 mV
Pulse Gen Model: 3222
Pulse Gen Serial Number: 9090620

## 2019-09-07 ENCOUNTER — Encounter: Payer: Self-pay | Admitting: Cardiology

## 2019-09-07 NOTE — Progress Notes (Signed)
Remote pacemaker transmission.   

## 2019-09-07 NOTE — Addendum Note (Signed)
Addended by: Tiajuana Amass on: 09/07/2019 01:54 PM   Modules accepted: Level of Service

## 2019-10-19 ENCOUNTER — Encounter (HOSPITAL_COMMUNITY): Payer: Self-pay

## 2019-10-19 ENCOUNTER — Inpatient Hospital Stay (HOSPITAL_COMMUNITY)
Admission: EM | Admit: 2019-10-19 | Discharge: 2019-10-27 | DRG: 177 | Disposition: A | Discharge status: Skilled Nursing Facility | Payer: Medicare Other | Attending: Internal Medicine | Admitting: Internal Medicine

## 2019-10-19 ENCOUNTER — Emergency Department (HOSPITAL_COMMUNITY): Payer: Medicare Other

## 2019-10-19 ENCOUNTER — Other Ambulatory Visit: Payer: Self-pay

## 2019-10-19 DIAGNOSIS — U071 COVID-19: Principal | ICD-10-CM | POA: Diagnosis present

## 2019-10-19 DIAGNOSIS — E872 Acidosis: Secondary | ICD-10-CM | POA: Diagnosis present

## 2019-10-19 DIAGNOSIS — Z79899 Other long term (current) drug therapy: Secondary | ICD-10-CM

## 2019-10-19 DIAGNOSIS — R627 Adult failure to thrive: Secondary | ICD-10-CM | POA: Diagnosis present

## 2019-10-19 DIAGNOSIS — F0390 Unspecified dementia without behavioral disturbance: Secondary | ICD-10-CM | POA: Diagnosis present

## 2019-10-19 DIAGNOSIS — Z951 Presence of aortocoronary bypass graft: Secondary | ICD-10-CM

## 2019-10-19 DIAGNOSIS — I071 Rheumatic tricuspid insufficiency: Secondary | ICD-10-CM | POA: Diagnosis present

## 2019-10-19 DIAGNOSIS — G9341 Metabolic encephalopathy: Secondary | ICD-10-CM | POA: Diagnosis present

## 2019-10-19 DIAGNOSIS — Z66 Do not resuscitate: Secondary | ICD-10-CM | POA: Diagnosis present

## 2019-10-19 DIAGNOSIS — I4821 Permanent atrial fibrillation: Secondary | ICD-10-CM | POA: Diagnosis present

## 2019-10-19 DIAGNOSIS — J9601 Acute respiratory failure with hypoxia: Secondary | ICD-10-CM | POA: Diagnosis present

## 2019-10-19 DIAGNOSIS — I428 Other cardiomyopathies: Secondary | ICD-10-CM | POA: Diagnosis present

## 2019-10-19 DIAGNOSIS — I081 Rheumatic disorders of both mitral and tricuspid valves: Secondary | ICD-10-CM | POA: Diagnosis present

## 2019-10-19 DIAGNOSIS — F0391 Unspecified dementia with behavioral disturbance: Secondary | ICD-10-CM | POA: Diagnosis not present

## 2019-10-19 DIAGNOSIS — Z7901 Long term (current) use of anticoagulants: Secondary | ICD-10-CM

## 2019-10-19 DIAGNOSIS — R3121 Asymptomatic microscopic hematuria: Secondary | ICD-10-CM | POA: Diagnosis present

## 2019-10-19 DIAGNOSIS — E782 Mixed hyperlipidemia: Secondary | ICD-10-CM | POA: Diagnosis present

## 2019-10-19 DIAGNOSIS — J189 Pneumonia, unspecified organism: Secondary | ICD-10-CM | POA: Diagnosis not present

## 2019-10-19 DIAGNOSIS — I442 Atrioventricular block, complete: Secondary | ICD-10-CM | POA: Diagnosis present

## 2019-10-19 DIAGNOSIS — D7589 Other specified diseases of blood and blood-forming organs: Secondary | ICD-10-CM | POA: Diagnosis present

## 2019-10-19 DIAGNOSIS — N400 Enlarged prostate without lower urinary tract symptoms: Secondary | ICD-10-CM | POA: Diagnosis present

## 2019-10-19 DIAGNOSIS — I110 Hypertensive heart disease with heart failure: Secondary | ICD-10-CM | POA: Diagnosis present

## 2019-10-19 DIAGNOSIS — J1289 Other viral pneumonia: Secondary | ICD-10-CM | POA: Diagnosis present

## 2019-10-19 DIAGNOSIS — R8281 Pyuria: Secondary | ICD-10-CM | POA: Diagnosis present

## 2019-10-19 DIAGNOSIS — F05 Delirium due to known physiological condition: Secondary | ICD-10-CM | POA: Diagnosis not present

## 2019-10-19 DIAGNOSIS — I340 Nonrheumatic mitral (valve) insufficiency: Secondary | ICD-10-CM | POA: Diagnosis present

## 2019-10-19 DIAGNOSIS — I2510 Atherosclerotic heart disease of native coronary artery without angina pectoris: Secondary | ICD-10-CM | POA: Diagnosis present

## 2019-10-19 DIAGNOSIS — Z87440 Personal history of urinary (tract) infections: Secondary | ICD-10-CM

## 2019-10-19 DIAGNOSIS — H9190 Unspecified hearing loss, unspecified ear: Secondary | ICD-10-CM | POA: Diagnosis present

## 2019-10-19 DIAGNOSIS — Z888 Allergy status to other drugs, medicaments and biological substances status: Secondary | ICD-10-CM

## 2019-10-19 DIAGNOSIS — I5032 Chronic diastolic (congestive) heart failure: Secondary | ICD-10-CM | POA: Diagnosis present

## 2019-10-19 DIAGNOSIS — Z79891 Long term (current) use of opiate analgesic: Secondary | ICD-10-CM

## 2019-10-19 DIAGNOSIS — I10 Essential (primary) hypertension: Secondary | ICD-10-CM | POA: Diagnosis not present

## 2019-10-19 DIAGNOSIS — Z95810 Presence of automatic (implantable) cardiac defibrillator: Secondary | ICD-10-CM

## 2019-10-19 LAB — COMPREHENSIVE METABOLIC PANEL
ALT: 18 U/L (ref 0–44)
AST: 34 U/L (ref 15–41)
Albumin: 3.9 g/dL (ref 3.5–5.0)
Alkaline Phosphatase: 71 U/L (ref 38–126)
Anion gap: 9 (ref 5–15)
BUN: 19 mg/dL (ref 8–23)
CO2: 27 mmol/L (ref 22–32)
Calcium: 9.6 mg/dL (ref 8.9–10.3)
Chloride: 105 mmol/L (ref 98–111)
Creatinine, Ser: 0.72 mg/dL (ref 0.61–1.24)
GFR calc Af Amer: >60 mL/min (ref >60)
GFR calc non Af Amer: >60 mL/min (ref >60)
Glucose, Bld: 105 mg/dL — ABNORMAL HIGH (ref 70–99)
Potassium: 4.1 mmol/L (ref 3.5–5.1)
Sodium: 141 mmol/L (ref 135–145)
Total Bilirubin: 1.2 mg/dL (ref 0.3–1.2)
Total Protein: 7.4 g/dL (ref 6.5–8.1)

## 2019-10-19 LAB — URINALYSIS, ROUTINE W REFLEX MICROSCOPIC
Bilirubin Urine: NEGATIVE
Glucose, UA: NEGATIVE mg/dL
Ketones, ur: 20 mg/dL — AB
Leukocytes,Ua: NEGATIVE
Nitrite: NEGATIVE
Protein, ur: 100 mg/dL — AB
Specific Gravity, Urine: 1.024 (ref 1.005–1.030)
pH: 5 (ref 5.0–8.0)

## 2019-10-19 LAB — CBC WITH DIFFERENTIAL/PLATELET
Abs Immature Granulocytes: 0.03 10*3/uL (ref 0.00–0.07)
Basophils Absolute: 0 10*3/uL (ref 0.0–0.1)
Basophils Relative: 0 %
Eosinophils Absolute: 0.2 10*3/uL (ref 0.0–0.5)
Eosinophils Relative: 3 %
HCT: 46.6 % (ref 39.0–52.0)
Hemoglobin: 14.8 g/dL (ref 13.0–17.0)
Immature Granulocytes: 1 %
Lymphocytes Relative: 9 %
Lymphs Abs: 0.5 10*3/uL — ABNORMAL LOW (ref 0.7–4.0)
MCH: 33 pg (ref 26.0–34.0)
MCHC: 31.8 g/dL (ref 30.0–36.0)
MCV: 103.8 fL — ABNORMAL HIGH (ref 80.0–100.0)
Monocytes Absolute: 0.4 10*3/uL (ref 0.1–1.0)
Monocytes Relative: 6 %
Neutro Abs: 5.1 10*3/uL (ref 1.7–7.7)
Neutrophils Relative %: 81 %
Platelets: 115 10*3/uL — ABNORMAL LOW (ref 150–400)
RBC: 4.49 MIL/uL (ref 4.22–5.81)
RDW: 12.8 % (ref 11.5–15.5)
WBC: 6.2 10*3/uL (ref 4.0–10.5)
nRBC: 0 % (ref 0.0–0.2)

## 2019-10-19 LAB — LACTIC ACID, PLASMA
Lactic Acid, Venous: 2.3 mmol/L (ref 0.5–1.9)
Lactic Acid, Venous: 1.6 mmol/L (ref 0.5–1.9)

## 2019-10-19 LAB — APTT: aPTT: 30 seconds (ref 24–36)

## 2019-10-19 LAB — VITAMIN B12: Vitamin B-12: 591 pg/mL (ref 180–914)

## 2019-10-19 LAB — PROTIME-INR
INR: 1.2 (ref 0.8–1.2)
Prothrombin Time: 15.2 seconds (ref 11.4–15.2)

## 2019-10-19 LAB — MRSA PCR SCREENING: MRSA by PCR: NEGATIVE

## 2019-10-19 LAB — SARS CORONAVIRUS 2 (TAT 6-24 HRS): SARS Coronavirus 2: POSITIVE — AB

## 2019-10-19 LAB — VALPROIC ACID LEVEL: Valproic Acid Lvl: <10 ug/mL — ABNORMAL LOW (ref 50.0–100.0)

## 2019-10-19 MED ORDER — NYSTATIN 100000 UNIT/GM EX POWD
1.0000 g | Freq: Four times a day (QID) | CUTANEOUS | Status: DC | PRN
  Filled 2019-10-19: qty 15

## 2019-10-19 MED ORDER — MENTHOL-ZINC OXIDE 0.44-20.6 % EX OINT: 1.0000 "application " | TOPICAL_OINTMENT | Freq: Every day | CUTANEOUS | Status: DC | PRN

## 2019-10-19 MED ORDER — ACETAMINOPHEN 325 MG PO TABS
650.0000 mg | ORAL_TABLET | Freq: Once | ORAL | Status: AC
  Administered 2019-10-19: 18:00:00 650 mg via ORAL
  Filled 2019-10-19: qty 2

## 2019-10-19 MED ORDER — SODIUM CHLORIDE 0.9 % IV SOLN
1.0000 g | INTRAVENOUS | Status: DC
  Administered 2019-10-19 – 2019-10-21 (×3): 1 g via INTRAVENOUS
  Filled 2019-10-19 (×3): qty 1
  Filled 2019-10-19 (×2): qty 10

## 2019-10-19 MED ORDER — ADULT MULTIVITAMIN W/MINERALS CH
1.0000 | ORAL_TABLET | Freq: Every day | ORAL | Status: DC
  Administered 2019-10-20 – 2019-10-27 (×8): 1 via ORAL
  Filled 2019-10-19 (×8): qty 1

## 2019-10-19 MED ORDER — PHENYLEPH-SHARK LIV OIL-MO-PET 0.25-3-14-71.9 % RE OINT
1.0000 "application " | TOPICAL_OINTMENT | Freq: Two times a day (BID) | RECTAL | Status: DC | PRN
  Filled 2019-10-19: qty 28.4

## 2019-10-19 MED ORDER — SODIUM CHLORIDE 0.9 % IV SOLN: 1000.0000 mL | INTRAVENOUS | Status: DC

## 2019-10-19 MED ORDER — GUAIFENESIN ER 600 MG PO TB12
600.0000 mg | ORAL_TABLET | Freq: Two times a day (BID) | ORAL | Status: DC
  Administered 2019-10-19 – 2019-10-20 (×2): 600 mg via ORAL
  Filled 2019-10-19 (×2): qty 1

## 2019-10-19 MED ORDER — SODIUM CHLORIDE 0.9 % IV SOLN
2.0000 g | Freq: Once | INTRAVENOUS | Status: AC
  Administered 2019-10-19: 2 g via INTRAVENOUS
  Filled 2019-10-19: qty 2

## 2019-10-19 MED ORDER — SODIUM CHLORIDE 0.9 % IV SOLN
INTRAVENOUS | Status: DC
  Administered 2019-10-19 – 2019-10-23 (×5): via INTRAVENOUS

## 2019-10-19 MED ORDER — ACETAMINOPHEN 325 MG PO TABS: 650.0000 mg | ORAL_TABLET | Freq: Four times a day (QID) | ORAL | Status: DC | PRN

## 2019-10-19 MED ORDER — SODIUM CHLORIDE 0.9 % IV BOLUS (SEPSIS)
1000.0000 mL | Freq: Once | INTRAVENOUS | Status: AC
  Administered 2019-10-19: 15:00:00 1000 mL via INTRAVENOUS

## 2019-10-19 MED ORDER — SODIUM CHLORIDE 0.9 % IV SOLN
500.0000 mg | INTRAVENOUS | Status: DC
  Administered 2019-10-19 – 2019-10-21 (×3): 500 mg via INTRAVENOUS
  Filled 2019-10-19 (×3): qty 500

## 2019-10-19 MED ORDER — ALBUTEROL SULFATE (2.5 MG/3ML) 0.083% IN NEBU: 2.5000 mg | INHALATION_SOLUTION | RESPIRATORY_TRACT | Status: DC | PRN

## 2019-10-19 MED ORDER — ACETAMINOPHEN 650 MG RE SUPP
650.0000 mg | Freq: Once | RECTAL | Status: AC
  Administered 2019-10-19: 14:00:00 650 mg via RECTAL
  Filled 2019-10-19: qty 1

## 2019-10-19 MED ORDER — ZINC OXIDE 20 % EX OINT
TOPICAL_OINTMENT | CUTANEOUS | Status: DC | PRN
  Administered 2019-10-26: 09:00:00 via TOPICAL
  Filled 2019-10-19: qty 28.35

## 2019-10-19 MED ORDER — FOLIC ACID 1 MG PO TABS
1.0000 mg | ORAL_TABLET | Freq: Every day | ORAL | Status: DC
  Administered 2019-10-20 – 2019-10-27 (×8): 1 mg via ORAL
  Filled 2019-10-19 (×8): qty 1

## 2019-10-19 MED ORDER — DIVALPROEX SODIUM 125 MG PO DR TAB
125.0000 mg | DELAYED_RELEASE_TABLET | Freq: Two times a day (BID) | ORAL | Status: DC
  Administered 2019-10-19 – 2019-10-20 (×2): 125 mg via ORAL
  Filled 2019-10-19 (×4): qty 1

## 2019-10-19 MED ORDER — ACETAMINOPHEN 650 MG RE SUPP: 650.0000 mg | Freq: Four times a day (QID) | RECTAL | Status: DC | PRN

## 2019-10-19 MED ORDER — APIXABAN 5 MG PO TABS
5.0000 mg | ORAL_TABLET | Freq: Two times a day (BID) | ORAL | Status: DC
  Administered 2019-10-19 – 2019-10-27 (×16): 5 mg via ORAL
  Filled 2019-10-19 (×16): qty 1

## 2019-10-19 MED ORDER — ONDANSETRON HCL 4 MG/2ML IJ SOLN: 4.0000 mg | Freq: Four times a day (QID) | INTRAMUSCULAR | Status: DC | PRN

## 2019-10-19 MED ORDER — DONEPEZIL HCL 10 MG PO TABS
10.0000 mg | ORAL_TABLET | Freq: Every day | ORAL | Status: DC
  Administered 2019-10-19 – 2019-10-26 (×8): 10 mg via ORAL
  Filled 2019-10-19 (×8): qty 1

## 2019-10-19 MED ORDER — TAMSULOSIN HCL 0.4 MG PO CAPS
0.4000 mg | ORAL_CAPSULE | Freq: Every day | ORAL | Status: DC
  Administered 2019-10-19 – 2019-10-26 (×8): 0.4 mg via ORAL
  Filled 2019-10-19 (×8): qty 1

## 2019-10-19 MED ORDER — ONDANSETRON HCL 4 MG PO TABS: 4.0000 mg | ORAL_TABLET | Freq: Four times a day (QID) | ORAL | Status: DC | PRN

## 2019-10-19 MED ORDER — BRIMONIDINE TARTRATE 0.2 % OP SOLN
1.0000 [drp] | Freq: Three times a day (TID) | OPHTHALMIC | Status: DC
  Administered 2019-10-19 – 2019-10-27 (×21): 1 [drp] via OPHTHALMIC
  Filled 2019-10-19 (×2): qty 5

## 2019-10-19 MED ORDER — VANCOMYCIN HCL IN DEXTROSE 1-5 GM/200ML-% IV SOLN: 1000.0000 mg | Freq: Once | INTRAVENOUS | Status: DC

## 2019-10-19 MED ORDER — CARVEDILOL 3.125 MG PO TABS: 3.1250 mg | ORAL_TABLET | Freq: Two times a day (BID) | ORAL | Status: DC

## 2019-10-19 MED ORDER — PRAMOX-PE-GLYCERIN-PETROLATUM 1-0.25-14.4-15 % EX CREA: 1.0000 "application " | TOPICAL_CREAM | Freq: Every day | CUTANEOUS | Status: DC | PRN

## 2019-10-19 MED ORDER — MIRTAZAPINE 15 MG PO TABS
15.0000 mg | ORAL_TABLET | Freq: Every day | ORAL | Status: DC
  Administered 2019-10-19 – 2019-10-26 (×8): 15 mg via ORAL
  Filled 2019-10-19 (×8): qty 1

## 2019-10-19 MED ORDER — METRONIDAZOLE IN NACL 5-0.79 MG/ML-% IV SOLN
500.0000 mg | Freq: Once | INTRAVENOUS | Status: AC
  Administered 2019-10-19: 500 mg via INTRAVENOUS
  Filled 2019-10-19: qty 100

## 2019-10-19 NOTE — ED Notes (Signed)
X-ray at bedside

## 2019-10-19 NOTE — Progress Notes (Signed)
Paged regarding COVID-19 positive result in this patient admitted earlier today. I discussed patient's current respiratory status with his nurse, including no interval progression of supplemental oxygen demands, with patient most recently maintaining oxygen saturations in the high 90s on 2 L nasal cannula.  Remains hemodynamically stable.  Airborne and contact precautions with eye protection ordered.    Babs Bertin, DO Hospitalist

## 2019-10-19 NOTE — ED Notes (Signed)
Attempted report x1. 

## 2019-10-19 NOTE — ED Provider Notes (Signed)
[facility] DEPT Provider Note   CSN: [id number] Arrival date & time: 10/19/19  1302     History   Chief Complaint No chief complaint on file.   HPI Grant Lawrence is a 85 y.o. male.     85 year old male with history of dementia presents with increased agitation.  Patient has history of UTI and this is similar.  Patient also noted to have low-grade temp of 99.5.  Patient's baseline is that he is nonverbal.  EMS did note patient had a smell of urine about him.  Transported for further management.     Past Medical History:  Diagnosis Date  . A-fib ([place])   . Angina pectoris ([place])   . Asymptomatic microscopic hematuria   . Cardiac defibrillator in place   . Cardiac pacemaker   . CHF (congestive heart failure) ([place])   . Coronary artery disease   . Dementia ([place])   . Hyperlipidemia   . Hypertension   . Long term (current) use of anticoagulants     Patient Active Problem List   Diagnosis Date Noted  . Pacemaker battery depletion 05/19/2019  . ICD (implantable cardioverter-defibrillator) battery depletion   . FTT (failure to thrive) in adult   . Community acquired pneumonia of both lower lobes 01/13/2019  . Nausea and vomiting 01/13/2019  . Acute respiratory failure with hypoxia ([place]) 01/13/2019  . Recurrent aspiration pneumonia ([place]) 08/28/2018  . Coronary artery disease involving coronary bypass graft of native heart without angina pectoris 03/06/2018  . Hypercholesterolemia 03/06/2018  . Dementia ([place]) 09/09/2017  . BPH (benign prostatic hyperplasia) 07/22/2016  . Elevated PSA 07/22/2016  . PVCs (premature ventricular contractions) 07/03/2016  . History of repair of mitral valve 07/03/2016  . CAD (coronary artery disease)s/p CABG 2005 08/13/2014  . Mitral valve insufficiency s/p annuloplasty 2005 08/13/2014  . s/p CRT-D St Jude 2010,gen change Sept 2015 08/13/2014  . Elective replacement indicated for implantable  cardioverter-defibrillator (ICD) reached July 03, 2014 08/13/2014  . HTN (hypertension) 08/13/2014  . Mixed hyperlipidemia 08/13/2014  . Aortic insufficiency 08/13/2014  . Tricuspid insufficiency 08/13/2014  . Statin intolerance 08/13/2014  . CHB (complete heart block) s/p AV node ablation 08/13/2014  . Chronic diastolic heart failure ([place]) 08/13/2014  . A-fib ([place]) 08/12/2014    Past Surgical History:  Procedure Laterality Date  . BIV PACEMAKER GENERATOR CHANGEOUT N/A 05/19/2019   Procedure: BIV PACEMAKER GENERATOR CHANGEOUT;  Surgeon: Sanda Klein, MD;  Location: [facility];  Service: Cardiovascular;  Laterality: N/A;  . CARDIAC DEFIBRILLATOR PLACEMENT  09/26/2009  . CORONARY ARTERY BYPASS GRAFT  2005  . CORONARY ARTERY BYPASS GRAFT    . IMPLANTABLE CARDIOVERTER DEFIBRILLATOR (ICD) GENERATOR CHANGE N/A 08/23/2014   Procedure: ICD GENERATOR CHANGE;  Surgeon: Sanda Klein, MD;  Location: [facility];  Service: Cardiovascular;  Laterality: N/A;  . MITRAL VALVE REPAIR (MV)/CORONARY ARTERY BYPASS GRAFTING (CABG)    . MITRAL VALVULOPLASTY  2005        Home Medications    Prior to Admission medications   Medication Sig Start Date End Date Taking? Authorizing Provider  acetaminophen (TYLENOL) 325 MG tablet Take 650 mg by mouth every 4 (four) hours as needed (pain). Do not exceed 3g in 24 hours    [provider]  apixaban (ELIQUIS) 5 MG TABS tablet Take 1 tablet (5 mg total) by mouth 2 (two) times daily. 05/20/19   Croitoru, Mihai, MD  brimonidine (ALPHAGAN) 0.2 % ophthalmic solution Place 1 drop into both  eyes 3 (three) times daily.    [provider]  carvedilol (COREG) 3.125 MG tablet TAKE ONE TABLET BY MOUTH TWICE DAILY WITH MEALS Patient taking differently: Take 3.125 mg by mouth 2 (two) times daily.  08/25/17   Marletta Lor, MD  divalproex (DEPAKOTE) 125 MG DR tablet Take 125 mg by mouth 2 (two) times daily.     [provider]   docusate sodium (COLACE) 100 MG capsule Take 100 mg by mouth 2 (two) times daily as needed for mild constipation.     [provider]  donepezil (ARICEPT) 10 MG tablet Take 10 mg by mouth at bedtime.  07/08/18   [provider]  ezetimibe (ZETIA) 10 MG tablet Take 1 tablet (10 mg total) daily by mouth. Patient taking differently: Take 10 mg by mouth at bedtime.  10/24/17   Marletta Lor, MD  finasteride (PROSCAR) 5 MG tablet Take 5 mg by mouth daily.    Festus Aloe, MD  Menthol-Zinc Oxide [facility]) 0.44-20.6 % OINT Apply 1 application topically daily as needed (redness).    [provider]  mirtazapine (REMERON) 15 MG tablet Take 15 mg by mouth at bedtime.     [provider]  NUTRITIONAL SUPPLEMENT LIQD Take 120 mLs by mouth 3 (three) times daily. House supplement    [provider]  nystatin (NYSTATIN) powder Apply 1 g topically every 6 (six) hours as needed (rash).     [provider]  phenol (CHLORASEPTIC) 1.4 % LIQD Use as directed 2 sprays in the mouth or throat every 6 (six) hours as needed for throat irritation / pain.    [provider]  Pramox-PE-Glycerin-Petrolatum (HEMORRHOIDAL) 1-0.25-14.4-15 % CREA Apply 1 application topically daily as needed (hemorrhoids).    [provider]  tamsulosin (FLOMAX) 0.4 MG CAPS capsule Take 0.4 mg by mouth at bedtime.  03/28/17   Festus Aloe, MD  traMADol (ULTRAM) 50 MG tablet Take 1 tablet by mouth daily as needed. 08/31/19   [provider]    Family History Family History  Problem Relation Age of Onset  . Cancer Father   . Diabetes Brother     Social History Social History   Tobacco Use  . Smoking status: Never Smoker  . Smokeless tobacco: Never Used  Substance Use Topics  . Alcohol use: No  . Drug use: No     Allergies   Rosuvastatin calcium and Statins   Review of Systems Review of Systems  Unable to perform ROS: Dementia      Physical Exam Updated Vital Signs There were no vitals taken for this visit.  Physical Exam Vitals signs and nursing note reviewed.  Constitutional:      General: He is not in acute distress.    Appearance: Normal appearance. He is well-developed. He is not toxic-appearing.  HENT:     Head: Normocephalic and atraumatic.  Eyes:     General: Lids are normal.     Conjunctiva/sclera: Conjunctivae normal.     Pupils: Pupils are equal, round, and reactive to light.  Neck:     Musculoskeletal: Normal range of motion and neck supple.     Thyroid: No thyroid mass.     Trachea: No tracheal deviation.  Cardiovascular:     Rate and Rhythm: Normal rate and regular rhythm.     Heart sounds: Normal heart sounds. No murmur. No gallop.   Pulmonary:     Effort: Pulmonary effort is normal. No respiratory distress.  Breath sounds: Normal breath sounds. No stridor. No decreased breath sounds, wheezing, rhonchi or rales.  Abdominal:     General: Bowel sounds are normal. There is no distension.     Palpations: Abdomen is soft.     Tenderness: There is no abdominal tenderness. There is no rebound.  Musculoskeletal: Normal range of motion.        General: No tenderness.  Skin:    General: Skin is warm and dry.     Findings: No abrasion or rash.  Neurological:     Mental Status: He is alert. He is confused.     GCS: GCS eye subscore is 4. GCS verbal subscore is 5. GCS motor subscore is 6.     Cranial Nerves: No facial asymmetry.     Motor: Tremor present.     Comments: Patient withdraws to pain in all 4 extremities  Psychiatric:        Attention and Perception: He is inattentive.      ED Treatments / Results  Labs (all labs ordered are listed, but only abnormal results are displayed) Labs Reviewed  CULTURE, BLOOD (ROUTINE X 2)  CULTURE, BLOOD (ROUTINE X 2)  URINE CULTURE  LACTIC ACID, PLASMA  LACTIC ACID, PLASMA  COMPREHENSIVE METABOLIC PANEL  CBC WITH DIFFERENTIAL/PLATELET   APTT  PROTIME-INR  URINALYSIS, ROUTINE W REFLEX MICROSCOPIC  VALPROIC ACID LEVEL    EKG EKG Interpretation  Date/Time:  Tuesday October 19 2019 13:27:49 EST Ventricular Rate:  75 PR Interval:    QRS Duration: 177 QT Interval:  403 QTC Calculation: 451 R Axis:   -75 Text Interpretation: Sinus rhythm Short PR interval IVCD, consider atypical RBBB Confirmed by Lacretia Leigh ([id number]) on 10/19/2019 2:11:43 PM   Radiology No results found.  Procedures Procedures (including critical care time)  Medications Ordered in ED Medications  0.9 %  sodium chloride infusion (has no administration in time range)     Initial Impression / Assessment and Plan / ED Course  I have reviewed the triage vital signs and the nursing notes.  Pertinent labs & imaging results that were available during my care of the patient were reviewed by me and considered in my medical decision making (see chart for details).        Sepsis activated and the patient given IV fluid bolus of 30 cc/kg along with empiric antibiotics.  Urinalysis consistent with infection and chest x-ray shows pneumonia.  Is a DNR and this was confirmed with his daughter who I had a long conversation with.  Will admit to the hospitalist service  CRITICAL CARE Performed by: Leota Jacobsen Total critical care time: 45 minutes Critical care time was exclusive of separately billable procedures and treating other patients. Critical care was necessary to treat or prevent imminent or life-threatening deterioration. Critical care was time spent personally by me on the following activities: development of treatment plan with patient and/or surrogate as well as nursing, discussions with consultants, evaluation of patient's response to treatment, examination of patient, obtaining history from patient or surrogate, ordering and performing treatments and interventions, ordering and review of laboratory studies, ordering and review of  radiographic studies, pulse oximetry and re-evaluation of patient's condition.  BENJIE CAVASOS was evaluated in Emergency Department on 10/19/2019 for the symptoms described in the history of present illness. He was evaluated in the context of the global COVID-19 pandemic, which necessitated consideration that the patient might be at risk for infection with the SARS-CoV-2 virus that causes COVID-19. Institutional protocols  and algorithms that pertain to the evaluation of patients at risk for COVID-19 are in a state of rapid change based on information released by regulatory bodies including the CDC and federal and state organizations. These policies and algorithms were followed during the patient's care in the ED.    Final Clinical Impressions(s) / ED Diagnoses   Final diagnoses:  None    ED Discharge Orders    None       Lacretia Leigh, MD 10/19/19 [phone]

## 2019-10-19 NOTE — Progress Notes (Addendum)
Date and time results received: 10/19/19 2005 Test:SARS COVID-19 Critical Value:Positive Name of Provider Notified: Quincy Sheehan  Orders Received?  Or Actions Taken?: Continuing airborne and contact precautions

## 2019-10-19 NOTE — ED Triage Notes (Signed)
Transported by [facility] from [facility]-- change in mental status x 2 days. Temperature of 99.5. Hx of dementia, patient is non verbal and combative at baseline. Possible UTI.

## 2019-10-19 NOTE — H&P (Addendum)
History and Physical  Grant Lawrence [id number] DOB: 1931/05/12 DOA: 10/19/2019  PCP: Orvis Brill, [facility] Patient coming from: [facility].   I have personally briefly reviewed patient's old medical records in [place]   Chief Complaint: AMS  HPI: Grant Lawrence is a 85 y.o. male past medical history significant for dementia, confuse at baseline, nonischemic cardiomyopathy with recovered left ventricular ejection fraction, complete Heart block following AV node ablation s/p Pacemaker, permanent A. fib diastolic  congestive heart failure, hypertension, status post mitral valve annuloplasty, who was transferred from skilled nursing facility due to  mental status change, worsening agitation and fever. Patient is not able to provide history, he is alert but not respond  to questions.  Per nursing staff, Daughter report patient when he is able to talk he is speak nonsensical.  Patient is hard of hearing, and he is not able comprehend well, per daughter is difficult for patient to communicated.   Evaluation in the ED: Patient was found to be febrile temperature 104, respiration 21, blood pressure 114/58.  Sodium 141, potassium 4.1, chloride 105, CO2 27, glucose 105, BUN 19, creatinine 0.7, normal liver function tests and bilirubin.  Lactic acid 2.3, white blood cell 6.2, hemoglobin 14, MCV 103, UA 11-20 white blood cell negative nitrates.  Chest x-ray: New hazy airspace opacity in the left lower lobe suspicious for pneumonia. Resolved right upper lobe and right lower lobe airspace opacities.    Review of Systems: All systems reviewed and apart from history of presenting illness, are negative.  Past Medical History:  Diagnosis Date  . A-fib ([place])   . Angina pectoris ([place])   . Asymptomatic microscopic hematuria   . Cardiac defibrillator in place   . Cardiac pacemaker   . CHF (congestive heart failure) ([place])   . Coronary artery disease   . Dementia ([place])   .  Hyperlipidemia   . Hypertension   . Long term (current) use of anticoagulants    Past Surgical History:  Procedure Laterality Date  . BIV PACEMAKER GENERATOR CHANGEOUT N/A 05/19/2019   Procedure: BIV PACEMAKER GENERATOR CHANGEOUT;  Surgeon: Sanda Klein, MD;  Location: [facility];  Service: Cardiovascular;  Laterality: N/A;  . CARDIAC DEFIBRILLATOR PLACEMENT  09/26/2009  . CORONARY ARTERY BYPASS GRAFT  2005  . CORONARY ARTERY BYPASS GRAFT    . IMPLANTABLE CARDIOVERTER DEFIBRILLATOR (ICD) GENERATOR CHANGE N/A 08/23/2014   Procedure: ICD GENERATOR CHANGE;  Surgeon: Sanda Klein, MD;  Location: [facility] CATH LAB;  Service: Cardiovascular;  Laterality: N/A;  . MITRAL VALVE REPAIR (MV)/CORONARY ARTERY BYPASS GRAFTING (CABG)    . MITRAL VALVULOPLASTY  2005   Social History:  reports that he has never smoked. He has never used smokeless tobacco. He reports that he does not drink alcohol or use drugs.   Allergies  Allergen Reactions  . Rosuvastatin Calcium Other (See Comments)    Myalgia  . Statins Other (See Comments)    Myalgia    Family History  Problem Relation Age of Onset  . Cancer Father   . Diabetes Brother     Prior to Admission medications   Medication Sig Start Date End Date Taking? Authorizing Provider  acetaminophen (TYLENOL) 325 MG tablet Take 650 mg by mouth every 4 (four) hours as needed for mild pain. Do not exceed 3g in 24hrs.   Yes [provider]  apixaban (ELIQUIS) 5 MG TABS tablet Take 1 tablet (5 mg total) by mouth 2 (two) times daily.  05/20/19  Yes Croitoru, Mihai, MD  brimonidine (ALPHAGAN) 0.2 % ophthalmic solution Place 1 drop into both eyes 3 (three) times daily.   Yes [provider]  carvedilol (COREG) 3.125 MG tablet TAKE ONE TABLET BY MOUTH TWICE DAILY WITH MEALS Patient taking differently: Take 3.125 mg by mouth 2 (two) times daily with a meal.  08/25/17  Yes Marletta Lor, MD  divalproex (DEPAKOTE) 125 MG DR tablet Take 125  mg by mouth 2 (two) times daily.   Yes [provider]  docusate sodium (COLACE) 100 MG capsule Take 100 mg by mouth 2 (two) times daily as needed for mild constipation.   Yes [provider]  donepezil (ARICEPT) 10 MG tablet Take 10 mg by mouth at bedtime.   Yes [provider]  ezetimibe (ZETIA) 10 MG tablet Take 1 tablet (10 mg total) daily by mouth. Patient taking differently: Take 10 mg by mouth at bedtime.  10/24/17  Yes Marletta Lor, MD  finasteride (PROSCAR) 5 MG tablet Take 5 mg by mouth daily.   Yes Festus Aloe, MD  Menthol-Zinc Oxide 0.44-20.6 % OINT Apply 1 application topically daily as needed (rash).   Yes [provider]  mirtazapine (REMERON) 15 MG tablet Take 15 mg by mouth at bedtime.    Yes [provider]  NUTRITIONAL SUPPLEMENT LIQD Take 120 mLs by mouth 3 (three) times daily. House supplement   Yes [provider]  nystatin (NYSTATIN) powder Apply 1 g topically every 6 (six) hours as needed (rash).    Yes [provider]  Phenol-Glycerin (CHLORASEPTIC MAX SORE THROAT MT) Use as directed 2 sprays in the mouth or throat daily as needed (sore throat).   Yes [provider]  Pramox-PE-Glycerin-Petrolatum (HEMORRHOIDAL) 1-0.25-14.4-15 % CREA Apply 1 application topically daily as needed (hemorrhoids).   Yes [provider]  tamsulosin (FLOMAX) 0.4 MG CAPS capsule Take 0.4 mg by mouth at bedtime.  03/28/17  Yes Festus Aloe, MD   Physical Exam: Vitals:   10/19/19 1341 10/19/19 1500 10/19/19 1600 10/19/19 1604  BP:  118/70 (!) 114/58   Pulse:  71 70   Resp:  (!) 21 (!) 21   Temp: (S) (!) 104.4 F (40.2 C)   (!) 101.1 F (38.4 C)  TempSrc: Rectal   Rectal  SpO2:  99% 100%      General exam: Slender, well-nourished alert, not following commands  Head, eyes and ENT: Nontraumatic and normocephalic. Pupils equally reacting to light and accommodation. Oral mucosa moist.  Neck:  Supple. No JVD, carotid bruit or thyromegaly.  Lymphatics: No lymphadenopathy.  Respiratory system: No wheezing, bilateral rhonchorous. No increased work of breathing.  Cardiovascular system: S1 and S2 heard, RRR. No JVD, murmurs, gallops, clicks or pedal edema.  Gastrointestinal system: Abdomen is nondistended, soft and nontender. Normal bowel sounds heard. No organomegaly or masses appreciated.  Central nervous system: Alert, not answering question, not following commands.  Extremities: No edema peripheral pulses symmetrically felt.   Skin: No rashes or acute findings.  Musculoskeletal system: Negative exam.  Psychiatry: Unable to evaluate   Labs on Admission:  Basic Metabolic Panel: Recent Labs  Lab 10/19/19 1408  NA 141  K 4.1  CL 105  CO2 27  GLUCOSE 105*  BUN 19  CREATININE 0.72  CALCIUM 9.6   Liver Function Tests: Recent Labs  Lab 10/19/19 1408  AST 34  ALT 18  ALKPHOS 71  BILITOT 1.2  PROT 7.4  ALBUMIN 3.9   No  results for input(s): LIPASE, AMYLASE in the last 168 hours. No results for input(s): AMMONIA in the last 168 hours. CBC: Recent Labs  Lab 10/19/19 1408  WBC 6.2  NEUTROABS 5.1  HGB 14.8  HCT 46.6  MCV 103.8*  PLT 115*   Cardiac Enzymes: No results for input(s): CKTOTAL, CKMB, CKMBINDEX, TROPONINI in the last 168 hours.  BNP (last 3 results) No results for input(s): PROBNP in the last 8760 hours. CBG: No results for input(s): GLUCAP in the last 168 hours.  Radiological Exams on Admission: Dg Chest Port 1 View  Result Date: 10/19/2019 CLINICAL DATA:  Cough, altered mental status EXAM: PORTABLE CHEST 1 VIEW COMPARISON:  01/12/2019 FINDINGS: Left-sided implanted cardiac device with new generator. Leads in unchanged positioning. Median sternotomy with cardiac valve replacement. Stable cardiomegaly. Previously seen right upper lobe and right lower lobe airspace opacities have resolved. There is a hazy left lower lobe airspace opacity,  new from prior. No pleural effusion or pneumothorax. IMPRESSION: 1. New hazy airspace opacity in the left lower lobe suspicious for pneumonia. 2. Resolved right upper lobe and right lower lobe airspace opacities. Electronically Signed   By: Davina Poke M.D.   On: 10/19/2019 15:03    EKG: Independently reviewed.  Sinus rhythm  Assessment/Plan Active Problems:   CAD (coronary artery disease)s/p CABG 2005   Mitral valve insufficiency s/p annuloplasty 2005   s/p CRT-D St Jude 2010,gen change Sept 2015   HTN (hypertension)   Tricuspid insufficiency   CHB (complete heart block) s/p AV node ablation   Chronic diastolic heart failure (HCC)   Dementia (HCC)   FTT (failure to thrive) in adult   PNA (pneumonia)   1-Pneumonia: Rule out aspiration pneumonia.  Speech swallow evaluation. Admit patient to the hospital for IV fluids and IV antibiotics. Check for influenza, SIRS coronavirus 2,  Strep pneumonia antigen and Legionella in urine. Follow blood cultures. N.p.o., speech swallow evaluation.  Nurse to do bedside swallow evaluation for medication administration. Continue with ceftriaxone and azithromycin.  Check MRSA PCR.  If positive could consider adding vancomycin.  He received a dose of vancomycin today already.  2-Acute metabolic encephalopathy: Related to infection and underlying dementia. Patient appears to be lethargic, not following commands. Suspect related to fever and infection. Supportive care.  3-Coronary artery disease: Continue with metoprolol with hold parameters for blood pressure.  4-Dementia: Continue with Aricept, Depakote.  5-Macrocytosis: Check B12 level.  6-History of A. fib: Continue with Eliquis.  7-BPH: Continue with Flomax and Proscar. 8-Mild lactic acidosis: Received IV fluids. 9-pyuria: Already on IV antibiotics.  Follow urine culture.     DVT Prophylaxis: Eliquis Code Status: DNR , per ED physician patient has a MOST form he is DNR but  okay  with IV fluids and IV antibiotics. family Communication: Daughter first contact on list updated.  Disposition Plan: Admit to the hospital for under inpatient status for treatment of pneumonia, failure to thrive.  Time spent: 75 minutes.   Elmarie Shiley MD [facility]   10/19/2019, 5:01 PM

## 2019-10-19 NOTE — Plan of Care (Signed)
  Problem: Education: Goal: Knowledge of General Education information will improve Description: Including pain rating scale, medication(s)/side effects and non-pharmacologic comfort measures Outcome: Not Progressing   

## 2019-10-19 NOTE — Progress Notes (Signed)
A consult was received from an ED physician for vanc/cefepime per pharmacy dosing.  The patient's profile has been reviewed for ht/wt/allergies/indication/available labs.   A one time order has been placed for vanc 1g and cefepime 2g.  Further antibiotics/pharmacy consults should be ordered by admitting physician if indicated.                       Thank you, Kara Mead 10/19/2019  2:16 PM

## 2019-10-20 DIAGNOSIS — U071 COVID-19: Principal | ICD-10-CM

## 2019-10-20 DIAGNOSIS — F0391 Unspecified dementia with behavioral disturbance: Secondary | ICD-10-CM

## 2019-10-20 LAB — RESPIRATORY PANEL BY PCR

## 2019-10-20 LAB — COMPREHENSIVE METABOLIC PANEL
ALT: 16 U/L (ref 0–44)
AST: 28 U/L (ref 15–41)
Albumin: 2.9 g/dL — ABNORMAL LOW (ref 3.5–5.0)
Alkaline Phosphatase: 53 U/L (ref 38–126)
Anion gap: 7 (ref 5–15)
BUN: 18 mg/dL (ref 8–23)
CO2: 26 mmol/L (ref 22–32)
Calcium: 9 mg/dL (ref 8.9–10.3)
Chloride: 110 mmol/L (ref 98–111)
Creatinine, Ser: 0.63 mg/dL (ref 0.61–1.24)
GFR calc Af Amer: >60 mL/min (ref >60)
GFR calc non Af Amer: >60 mL/min (ref >60)
Glucose, Bld: 105 mg/dL — ABNORMAL HIGH (ref 70–99)
Potassium: 4.3 mmol/L (ref 3.5–5.1)
Sodium: 143 mmol/L (ref 135–145)
Total Bilirubin: 0.7 mg/dL (ref 0.3–1.2)
Total Protein: 5.9 g/dL — ABNORMAL LOW (ref 6.5–8.1)

## 2019-10-20 LAB — CBC
HCT: 39.9 % (ref 39.0–52.0)
Hemoglobin: 12.5 g/dL — ABNORMAL LOW (ref 13.0–17.0)
MCH: 33.2 pg (ref 26.0–34.0)
MCHC: 31.3 g/dL (ref 30.0–36.0)
MCV: 105.8 fL — ABNORMAL HIGH (ref 80.0–100.0)
Platelets: 82 10*3/uL — ABNORMAL LOW (ref 150–400)
RBC: 3.77 MIL/uL — ABNORMAL LOW (ref 4.22–5.81)
RDW: 13 % (ref 11.5–15.5)
WBC: 5.2 10*3/uL (ref 4.0–10.5)
nRBC: 0 % (ref 0.0–0.2)

## 2019-10-20 LAB — STREP PNEUMONIAE URINARY ANTIGEN: Strep Pneumo Urinary Antigen: POSITIVE — AB

## 2019-10-20 MED ORDER — RESOURCE THICKENUP CLEAR PO POWD
ORAL | Status: DC | PRN
  Filled 2019-10-20: qty 125

## 2019-10-20 MED ORDER — DEXAMETHASONE 6 MG PO TABS
6.0000 mg | ORAL_TABLET | Freq: Every day | ORAL | Status: DC
  Administered 2019-10-20 – 2019-10-26 (×7): 6 mg via ORAL
  Filled 2019-10-20 (×7): qty 1

## 2019-10-20 MED ORDER — ALBUTEROL SULFATE HFA 108 (90 BASE) MCG/ACT IN AERS
1.0000 | INHALATION_SPRAY | RESPIRATORY_TRACT | Status: DC | PRN
  Filled 2019-10-20: qty 6.7

## 2019-10-20 MED ORDER — DIVALPROEX SODIUM 125 MG PO CSDR
125.0000 mg | DELAYED_RELEASE_CAPSULE | Freq: Two times a day (BID) | ORAL | Status: DC
  Administered 2019-10-20 – 2019-10-27 (×14): 125 mg via ORAL
  Filled 2019-10-20 (×16): qty 1

## 2019-10-20 MED ORDER — GUAIFENESIN 100 MG/5ML PO SOLN
15.0000 mL | Freq: Four times a day (QID) | ORAL | Status: DC
  Administered 2019-10-20 – 2019-10-26 (×19): 300 mg via ORAL
  Filled 2019-10-20 (×2): qty 15
  Filled 2019-10-20: qty 20
  Filled 2019-10-20: qty 45
  Filled 2019-10-20 (×3): qty 15
  Filled 2019-10-20: qty 10
  Filled 2019-10-20: qty 15
  Filled 2019-10-20: qty 10
  Filled 2019-10-20 (×6): qty 15
  Filled 2019-10-20: qty 20
  Filled 2019-10-20: qty 15

## 2019-10-20 MED ORDER — SODIUM CHLORIDE 0.9 % IV SOLN
200.0000 mg | Freq: Once | INTRAVENOUS | Status: AC
  Administered 2019-10-20: 16:00:00 200 mg via INTRAVENOUS
  Filled 2019-10-20: qty 40

## 2019-10-20 MED ORDER — SODIUM CHLORIDE 0.9 % IV SOLN
100.0000 mg | INTRAVENOUS | Status: DC
  Filled 2019-10-20 (×2): qty 20

## 2019-10-20 MED ORDER — CARVEDILOL 3.125 MG PO TABS
3.1250 mg | ORAL_TABLET | Freq: Two times a day (BID) | ORAL | Status: DC
  Administered 2019-10-20 – 2019-10-27 (×15): 3.125 mg via ORAL
  Filled 2019-10-20 (×15): qty 1

## 2019-10-20 NOTE — Evaluation (Signed)
Clinical/Bedside Swallow Evaluation Patient Details  Name: Grant Lawrence MRN: [id number] Date of Birth: 04-12-1931  Today's Date: 10/20/2019 Time: SLP Start Time (ACUTE ONLY): 1045 SLP Stop Time (ACUTE ONLY): 1128 SLP Time Calculation (min) (ACUTE ONLY): 43 min  Past Medical History:  Past Medical History:  Diagnosis Date  . A-fib ([place])   . Angina pectoris ([place])   . Asymptomatic microscopic hematuria   . Cardiac defibrillator in place   . Cardiac pacemaker   . CHF (congestive heart failure) ([place])   . Coronary artery disease   . Dementia ([place])   . Hyperlipidemia   . Hypertension   . Long term (current) use of anticoagulants    Past Surgical History:  Past Surgical History:  Procedure Laterality Date  . BIV PACEMAKER GENERATOR CHANGEOUT N/A 05/19/2019   Procedure: BIV PACEMAKER GENERATOR CHANGEOUT;  Surgeon: Sanda Klein, MD;  Location: [place] [facility];  Service: Cardiovascular;  Laterality: N/A;  . CARDIAC DEFIBRILLATOR PLACEMENT  09/26/2009  . CORONARY ARTERY BYPASS GRAFT  2005  . CORONARY ARTERY BYPASS GRAFT    . IMPLANTABLE CARDIOVERTER DEFIBRILLATOR (ICD) GENERATOR CHANGE N/A 08/23/2014   Procedure: ICD GENERATOR CHANGE;  Surgeon: Sanda Klein, MD;  Location: [place] [facility];  Service: Cardiovascular;  Laterality: N/A;  . MITRAL VALVE REPAIR (MV)/CORONARY ARTERY BYPASS GRAFTING (CABG)    . MITRAL VALVULOPLASTY  2005   HPI:  85 yo male adm to [facility] with fever and found to have pna.  PT resides at [facility] and has a MOST for signed in January 2020.  PMH + for dementia, CAD, AV block s/p pacemaker.  Imaging with this admit showed right upper and lower lobe opacities with resolved left LL ASD.  Imaging also showed sinusitis extensive.  Of note, pt also with h/o possible pna 08/2018 and 01/2019.  Pt has tested positive for COVID.  Swallow eval ordered.   Assessment / Plan / Recommendation Clinical Impression  Pt presents with decreased mentation, delayed responses but is  alert.  He was provided with extensive oral care using moisture and oral suction removing viscous green tinged secretions.  He would accept ice chips, nectar tsps, cup and straw, thin via tsp/straw and applesauce and hand over hand assist facilitated improved swallow efficiency.  Immediate and delayed throat clearing and subtle cough observed after thin liquid consumption concerning for mistiming of laryngeal closure and/or premature spillage of bolus into pharynx/larynx.    Pt did not swallow pills whole with applesauce nor nectar with several attempts and nurse present.  Thus crushed medications indicated.  No s/s of aspiration with ice chip, nectar nor small boluses of puree.  His cough is not strong and thus to maximize his comfort with intake, SLP recommends full liquids - nectar thick and pt be allowed single ice chips.  SLP will follow up for appropriateness for dietary advancement.     OF note, pt was seen by SLP in the past and daughter had decided to proceed with comfort po in February 2020.  At this time, however for maximal comfort - nectar liquids and single ice chips clinically appear to provide most comfort.  Skilled intervention included dietary recommendations and effective compensation strategy initiation.  Thanks for this order.  SLP Visit Diagnosis: Dysphagia, oropharyngeal phase (R13.12)    Aspiration Risk  Moderate aspiration risk    Diet Recommendation Nectar-thick liquid(full liquids, nectar, single ice chips ok)   Liquid Administration via: Straw;Cup;Spoon Medication Administration: Crushed with puree Supervision: Staff to assist with self feeding  Compensations: Slow rate;Small sips/bites;Other (Comment)(assure pt swallows before giving moe) Postural Changes: Seated upright at 90 degrees;Remain upright for at least 30 minutes after po intake    Other  Recommendations Oral Care Recommendations: Oral care QID Other Recommendations: Have oral suction available;Order  thickener from pharmacy   Follow up Recommendations        Frequency and Duration min 2x/week  1 week       Prognosis Prognosis for Safe Diet Advancement: Fair Barriers to Reach Goals: Time post onset;Cognitive deficits      Swallow Study   General Date of Onset: 10/20/19 HPI: 85 yo male adm to [facility] with fever and found to have pna.  PT resides at [facility] and has a MOST for signed in January 2020.  PMH + for dementia, CAD, AV block s/p pacemaker.  Imaging with this admit showed right upper and lower lobe opacities with resolved left LL ASD.  Imaging also showed sinusitis extensive.  Of note, pt also with h/o possible pna 08/2018 and 01/2019.  Pt has tested positive for COVID.  Swallow eval ordered. Previous Swallow Assessment: 01/2019 per SLP conversation with pt's daughter during admission - daughter desired comfort diet for pt and MBS was not pursued Diet Prior to this Study: NPO Temperature Spikes Noted: Yes(104.4) Respiratory Status: Nasal cannula History of Recent Intubation: No Behavior/Cognition: Alert Oral Cavity Assessment: Excessive secretions;Other (comment)(posterior green tinged viscous secretions) Oral Care Completed by SLP: Yes(slp provided oral care) Oral Cavity - Dentition: Adequate natural dentition Vision: Functional for self-feeding Self-Feeding Abilities: Able to feed self Patient Positioning: Upright in bed Baseline Vocal Quality: Low vocal intensity Volitional Cough: Cognitively unable to elicit Volitional Swallow: Unable to elicit    Oral/Motor/Sensory Function Overall Oral Motor/Sensory Function: Generalized oral weakness   Ice Chips Ice chips: Within functional limits Presentation: Spoon   Thin Liquid Thin Liquid: Impaired Presentation: Self Fed;Spoon;Straw Oral Phase Impairments: Reduced labial seal;Reduced lingual movement/coordination Oral Phase Functional Implications: Oral holding;Prolonged oral transit Pharyngeal  Phase Impairments: Throat  Clearing - Immediate;Throat Clearing - Delayed Other Comments: subjectively pt's swallow appeared delayed at times requiring verbal cues    Nectar Thick Nectar Thick Liquid: Impaired Presentation: Spoon;Straw Oral Phase Impairments: Reduced lingual movement/coordination;Reduced labial seal Oral phase functional implications: Oral holding;Prolonged oral transit Pharyngeal Phase Impairments: Throat Clearing - Immediate;Cough - Delayed;Throat Clearing - Delayed   Honey Thick Honey Thick Liquid: Not tested   Puree Puree: Impaired Presentation: Spoon Oral Phase Impairments: Reduced labial seal;Reduced lingual movement/coordination Oral Phase Functional Implications: Oral holding;Prolonged oral transit Pharyngeal Phase Impairments: Suspected delayed Swallow   Solid     Solid: Not tested Other Comments: DNT due to pt's decreased mentation      Macario Golds 10/20/2019,11:54 AM  Luanna Salk, MS [place] Pager [phone] Office [phone]

## 2019-10-20 NOTE — Progress Notes (Addendum)
Report called to [facility] at [facility]. [facility] for transportation. Pt's daughter informed of transfer. Eulas Post, RN

## 2019-10-20 NOTE — Progress Notes (Signed)
PROGRESS NOTE    Grant Lawrence  [id number] DOB: 1931/10/07 DOA: 10/19/2019 PCP: Orvis Brill, [facility]    Brief Narrative:  Grant Lawrence is a 85 y.o. male past medical history significant for dementia, confused at baseline, nonischemic cardiomyopathy with recovered left ventricular ejection fraction, complete Heart block following AV node ablation s/p Pacemaker, permanent A. fib diastolic  congestive heart failure, hypertension, status post mitral valve annuloplasty, who was transferred from skilled nursing facility due to  mental status change, worsening agitation and fever. Patient is hard of hearing, and he is not able comprehend well, per daughter is difficult for patient to communicate.   Evaluation in the ED: Patient was found to be febrile temperature 104, respiration 21, blood pressure 114/58.  Sodium 141, potassium 4.1, chloride 105, CO2 27, glucose 105, BUN 19, creatinine 0.7, normal liver function tests and bilirubin.  Lactic acid 2.3, white blood cell 6.2, hemoglobin 14, MCV 103, UA 11-20 white blood cell negative nitrates.  Chest x-ray: New hazy airspace opacity in the left lower lobe suspicious for pneumonia. Resolved right upper lobe and right lower lobe airspace opacities.  His Covid test came back positive.   Assessment & Plan:   Active Problems:   CAD (coronary artery disease)s/p CABG 2005   Mitral valve insufficiency s/p annuloplasty 2005   s/p CRT-D St Jude 2010,gen change Sept 2015   HTN (hypertension)   Tricuspid insufficiency   CHB (complete heart block) s/p AV node ablation   Chronic diastolic heart failure (HCC)   Dementia (HCC)   FTT (failure to thrive) in adult   PNA (pneumonia)  Pneumonia: Rule out aspiration pneumonia.  Speech swallow evaluation. His Covid came back positive.  Ordered viral respiratory panel. Strep pneumonia antigen and Legionella in urine. Follow blood cultures. N.p.o., speech swallow evaluation.  Nurse to do bedside  swallow evaluation for medication administration. Continue with ceftriaxone and azithromycin.  MRSA PCR was negative.  He received a dose of Flagyl.  COVID-19 infection Patient's COVID-19 test was positive.  Per the patient's daughter he is usually on room air at the nursing facility.  Now requiring 1 and half to 2 L oxygen by nasal cannula. -Requested transfer to [facility].  He has a bed but may take several hours for him to be transported there per the nursing staff. -Discussed with the patient daughter and she is agreeable for him receiving treatment for COVID-19 infection. -Discussed with pharmacy.  We will start him on steroids, remdesivir. -Continue to monitor.  Acute metabolic encephalopathy: Related to infection and underlying dementia. Patient lethargic, not following commands. Suspect related to fever and infection. Supportive care.  Coronary artery disease: Continue with metoprolol with hold parameters for blood pressure. -Continue to monitor blood pressure closely and adjust medications as needed  Dementia: Continue with Aricept, Depakote.  Macrocytosis: Vitamin B12 591.  History of A. fib: Continue with Eliquis.  BPH: Continue with Flomax and Proscar. Mild lactic acidosis: Received IV fluids. pyuria: Already on IV antibiotics.  Follow urine culture.  DVT Prophylaxis: Eliquis Code Status: DNR. Patient has a MOST form he is DNR but  okay with IV fluids and IV antibiotics. family Communication:  I called the patient's daughter and updated her about the patient's condition and discussed with her about the plan of care Disposition Plan:  Patient to be transferred to [facility].   Antimicrobials:  Antibiotics Given (last 72 hours)    Date/Time Action Medication Dose Rate   10/19/19 1514 New Bag/Given  ceFEPIme (MAXIPIME) 2 g in sodium chloride 0.9 % 100 mL IVPB 2 g 200 mL/hr   10/19/19 1607 New Bag/Given   metroNIDAZOLE (FLAGYL) IVPB 500 mg  500 mg 100 mL/hr   10/19/19 1803 New Bag/Given   azithromycin (ZITHROMAX) 500 mg in sodium chloride 0.9 % 250 mL IVPB 500 mg 250 mL/hr   10/19/19 2154 New Bag/Given   cefTRIAXone (ROCEPHIN) 1 g in sodium chloride 0.9 % 100 mL IVPB 1 g 200 mL/hr         Subjective: He is lethargic, not following commands.  Objective: Vitals:   10/19/19 1729 10/19/19 1735 10/19/19 2151 10/20/19 0615  BP: 122/75  113/73 111/75  Pulse: 72  69 67  Resp:   20 20  Temp: 99.5 F (37.5 C)  98.6 F (37 C) 98.7 F (37.1 C)  TempSrc: Oral  Oral Oral  SpO2: 100%  (!) 82% 98%  Weight:  63.5 kg    Height:  6' (1.829 m)      Intake/Output Summary (Last 24 hours) at 10/20/2019 0743 Last data filed at 10/20/2019 0600 Gross per 24 hour  Intake 1460.32 ml  Output 100 ml  Net 1360.32 ml   Filed Weights   10/19/19 1735  Weight: 63.5 kg    Examination:  General exam: Ill-appearing male, somewhat lethargic but arousable Respiratory system: Rhonchi heard, no wheezing Cardiovascular system: S1 & S2.  No murmur. Gastrointestinal system: Abdomen is nondistended, soft. Normal bowel sounds heard. Central nervous system: Lethargic, unable to do a neurologic exam Extremities: No edema Skin: No rashes Psychiatry: Unable to evaluate at this time    Data Reviewed: I have personally reviewed following labs and imaging studies  CBC: Recent Labs  Lab 10/19/19 1408 10/20/19 0346  WBC 6.2 5.2  NEUTROABS 5.1  --   HGB 14.8 12.5*  HCT 46.6 39.9  MCV 103.8* 105.8*  PLT 115* 82*   Basic Metabolic Panel: Recent Labs  Lab 10/19/19 1408 10/20/19 0346  NA 141 143  K 4.1 4.3  CL 105 110  CO2 27 26  GLUCOSE 105* 105*  BUN 19 18  CREATININE 0.72 0.63  CALCIUM 9.6 9.0   GFR: Estimated Creatinine Clearance: 59.5 mL/min (by C-G formula based on SCr of 0.63 mg/dL). Liver Function Tests: Recent Labs  Lab 10/19/19 1408 10/20/19 0346  AST 34 28  ALT 18 16  ALKPHOS 71 53  BILITOT 1.2 0.7  PROT 7.4  5.9*  ALBUMIN 3.9 2.9*   No results for input(s): LIPASE, AMYLASE in the last 168 hours. No results for input(s): AMMONIA in the last 168 hours. Coagulation Profile: Recent Labs  Lab 10/19/19 1408  INR 1.2   Cardiac Enzymes: No results for input(s): CKTOTAL, CKMB, CKMBINDEX, TROPONINI in the last 168 hours. BNP (last 3 results) No results for input(s): PROBNP in the last 8760 hours. HbA1C: No results for input(s): HGBA1C in the last 72 hours. CBG: No results for input(s): GLUCAP in the last 168 hours. Lipid Profile: No results for input(s): CHOL, HDL, LDLCALC, TRIG, CHOLHDL, LDLDIRECT in the last 72 hours. Thyroid Function Tests: No results for input(s): TSH, T4TOTAL, FREET4, T3FREE, THYROIDAB in the last 72 hours. Anemia Panel: Recent Labs    10/19/19 [id number]   Sepsis Labs: Recent Labs  Lab 10/19/19 1408 10/19/19 1604  LATICACIDVEN 2.3* 1.6    Recent Results (from the past 240 hour(s))  Blood Culture (routine x 2)     Status: None (Preliminary result)   Collection  Time: 10/19/19  2:09 PM   Specimen: Site Not Specified; Blood  Result Value Ref Range Status   Specimen Description   Final    SITE NOT SPECIFIED Performed at [facility] [address] [place], [place] [address]    Special Requests   Final    BOTTLES DRAWN AEROBIC AND ANAEROBIC Blood Culture results may not be optimal due to an inadequate volume of blood received in culture bottles Performed at [facility] [address] [place], [place] [address]    Culture   Final    NO GROWTH < 24 HOURS Performed at [facility] [address] [place], [place] [address]    Report Status PENDING  Incomplete  Blood Culture (routine x 2)     Status: None (Preliminary result)   Collection Time: 10/19/19  2:09 PM   Specimen: Site Not Specified; Blood  Result Value Ref Range Status   Specimen Description   Final    SITE NOT SPECIFIED Performed at  [facility] [address] [place], [place] [address]    Special Requests   Final    BOTTLES DRAWN AEROBIC AND ANAEROBIC Blood Culture results may not be optimal due to an inadequate volume of blood received in culture bottles Performed at [facility] [address], [place], [place] [address]    Culture   Final    NO GROWTH < 24 HOURS Performed at [facility] [address] [place], [place] [address]    Report Status PENDING  Incomplete  SARS CORONAVIRUS 2 (TAT 6-24 HRS) Nasopharyngeal Nasopharyngeal Swab     Status: Abnormal   Collection Time: 10/19/19  2:09 PM   Specimen: Nasopharyngeal Swab  Result Value Ref Range Status   SARS Coronavirus 2 POSITIVE (A) NEGATIVE Final    Comment: RESULT CALLED TO, READ BACK BY AND VERIFIED WITH: Gray Bernhardt, RN AT 2206 ON 10/19/2019 BY SAINVILUS S (NOTE) SARS-CoV-2 target nucleic acids are DETECTED. The SARS-CoV-2 RNA is generally detectable in upper and lower respiratory specimens during the acute phase of infection. Positive results are indicative of active infection with SARS-CoV-2. Clinical  correlation with patient history and other diagnostic information is necessary to determine patient infection status. Positive results do  not rule out bacterial infection or co-infection with other viruses. The expected result is Negative. Fact Sheet for Patients: SugarRoll.be Fact Sheet for Healthcare Providers: https://www.woods-mathews.com/ This test is not yet approved or cleared by the [place] FDA and  has been authorized for detection and/or diagnosis of SARS-CoV-2 by FDA under an Emergency Use Authorization (EUA). This EUA will remain  in effect (meaning this test can b e used) for the duration of the COVID-19 declaration under Section 564(b)(1) of the Act, 21 U.S.C. section 360bbb-3(b)(1), unless the authorization is terminated or revoked sooner.  Performed at [facility], [place] [address] [place], [place] [address]   MRSA PCR Screening     Status: None   Collection Time: 10/19/19  5:53 PM   Specimen: Nasal Mucosa; Nasopharyngeal  Result Value Ref Range Status   MRSA by PCR NEGATIVE NEGATIVE Final    Comment:        The GeneXpert MRSA Assay (FDA approved for NASAL specimens only), is one component of a comprehensive MRSA colonization surveillance program. It is not intended to diagnose MRSA infection nor to guide or monitor treatment for MRSA infections. Performed at [facility], [place]  [address], [place], [place] [address]          Radiology Studies: Dg Chest Port 1 View  Result Date: 10/19/2019 CLINICAL DATA:  Cough, altered mental status EXAM: PORTABLE CHEST 1 VIEW COMPARISON:  01/12/2019 FINDINGS: Left-sided implanted cardiac device with new generator. Leads in unchanged positioning. Median sternotomy with cardiac valve replacement. Stable cardiomegaly. Previously seen right upper lobe and right lower lobe airspace opacities have resolved. There is a hazy left lower lobe airspace opacity, new from prior. No pleural effusion or pneumothorax. IMPRESSION: 1. New hazy airspace opacity in the left lower lobe suspicious for pneumonia. 2. Resolved right upper lobe and right lower lobe airspace opacities. Electronically Signed   By: Davina Poke M.D.   On: 10/19/2019 15:03        Scheduled Meds: . apixaban  5 mg Oral BID  . brimonidine  1 drop Both Eyes TID  . carvedilol  3.125 mg Oral BID  . divalproex  125 mg Oral BID  . donepezil  10 mg Oral QHS  . folic acid  1 mg Oral Daily  . guaiFENesin  600 mg Oral BID  . mirtazapine  15 mg Oral QHS  . multivitamin with minerals  1 tablet Oral Daily  . tamsulosin  0.4 mg Oral QHS   Continuous Infusions: . sodium chloride 75 mL/hr at 10/19/19 1751  . azithromycin Stopped (10/19/19 1903)  . cefTRIAXone (ROCEPHIN)  IV Stopped (10/19/19 2224)      LOS: 1 day       Yaakov Guthrie, MD [facility] Pager on [place]  If 7PM-7AM, please contact night-coverage [email or web address] Password [facility] 10/20/2019, 7:43 AM

## 2019-10-20 NOTE — Progress Notes (Signed)
Pharmacy: Remdesivir   Grant Lawrence is a(n) 85 y.o. male admitted with COVID. Pharmacy has been consulted for remdesivir dosing. Chest x-ray showed new hazy airspace opacity in the left lower lobe suspicious for pneumonia and he is requiring 2L of supplemental O2.    Assessment: Lab Results  Component Value Date   ALT 16 10/20/2019     Plan:  . Remdesivir 200 mg IV once followed by 100 mg IV daily x 4 days . Daily CMET while on remdesivir . Follow ALT and clinical condition  Thank you,  Darnelle Bos, PharmD 10/20/19 4:13 PM

## 2019-10-21 LAB — COMPREHENSIVE METABOLIC PANEL
ALT: 15 U/L (ref 0–44)
AST: 27 U/L (ref 15–41)
Albumin: 2.9 g/dL — ABNORMAL LOW (ref 3.5–5.0)
Alkaline Phosphatase: 54 U/L (ref 38–126)
Anion gap: 8 (ref 5–15)
BUN: 17 mg/dL (ref 8–23)
CO2: 25 mmol/L (ref 22–32)
Calcium: 9.3 mg/dL (ref 8.9–10.3)
Chloride: 109 mmol/L (ref 98–111)
Creatinine, Ser: 0.61 mg/dL (ref 0.61–1.24)
GFR calc Af Amer: >60 mL/min (ref >60)
GFR calc non Af Amer: >60 mL/min (ref >60)
Glucose, Bld: 120 mg/dL — ABNORMAL HIGH (ref 70–99)
Potassium: 4 mmol/L (ref 3.5–5.1)
Sodium: 142 mmol/L (ref 135–145)
Total Bilirubin: 0.9 mg/dL (ref 0.3–1.2)
Total Protein: 5.9 g/dL — ABNORMAL LOW (ref 6.5–8.1)

## 2019-10-21 LAB — CBC
HCT: 40.8 % (ref 39.0–52.0)
Hemoglobin: 12.9 g/dL — ABNORMAL LOW (ref 13.0–17.0)
MCH: 33.1 pg (ref 26.0–34.0)
MCHC: 31.6 g/dL (ref 30.0–36.0)
MCV: 104.6 fL — ABNORMAL HIGH (ref 80.0–100.0)
Platelets: 80 10*3/uL — ABNORMAL LOW (ref 150–400)
RBC: 3.9 MIL/uL — ABNORMAL LOW (ref 4.22–5.81)
RDW: 12.8 % (ref 11.5–15.5)
WBC: 4.6 10*3/uL (ref 4.0–10.5)
nRBC: 0 % (ref 0.0–0.2)

## 2019-10-21 LAB — FOLATE RBC
Folate, Hemolysate: 502 ng/mL
Folate, RBC: 1318 ng/mL (ref >498)
Hematocrit: 38.1 % (ref 37.5–51.0)

## 2019-10-21 LAB — URINE CULTURE: Culture: 60000 — AB

## 2019-10-21 MED ORDER — ZINC SULFATE 220 (50 ZN) MG PO CAPS
220.0000 mg | ORAL_CAPSULE | Freq: Every day | ORAL | Status: DC
  Administered 2019-10-21 – 2019-10-27 (×7): 220 mg via ORAL
  Filled 2019-10-21 (×7): qty 1

## 2019-10-21 MED ORDER — SODIUM CHLORIDE 0.9 % IV SOLN
100.0000 mg | INTRAVENOUS | Status: AC
  Administered 2019-10-23 – 2019-10-24 (×2): 100 mg via INTRAVENOUS
  Filled 2019-10-21: qty 100
  Filled 2019-10-21: qty 20

## 2019-10-21 MED ORDER — VITAMIN C 500 MG PO TABS
500.0000 mg | ORAL_TABLET | Freq: Every day | ORAL | Status: DC
  Administered 2019-10-21 – 2019-10-27 (×7): 500 mg via ORAL
  Filled 2019-10-21 (×7): qty 1

## 2019-10-21 MED ORDER — SODIUM CHLORIDE 0.9 % IV SOLN
100.0000 mg | Freq: Once | INTRAVENOUS | Status: AC
  Administered 2019-10-22: 100 mg via INTRAVENOUS
  Filled 2019-10-21: qty 100

## 2019-10-21 MED ORDER — SODIUM CHLORIDE 0.9 % IV SOLN
100.0000 mg | Freq: Once | INTRAVENOUS | Status: AC
  Administered 2019-10-21: 100 mg via INTRAVENOUS
  Filled 2019-10-21: qty 20

## 2019-10-21 MED ORDER — SODIUM CHLORIDE 0.9 % IV SOLN: 100.0000 mg | INTRAVENOUS | Status: DC

## 2019-10-21 NOTE — Progress Notes (Signed)
Called [name] [name] to give an update on the patient.

## 2019-10-21 NOTE — Progress Notes (Signed)
Report called to Earlie Server at [facility]. [facility] transferring patient now. Called patient's daughter [name] to let her know patient is being transferred to [facility] now.

## 2019-10-21 NOTE — Progress Notes (Signed)
[facility] admit paged informed of patient arrival to [facility] room 175

## 2019-10-21 NOTE — Progress Notes (Signed)
PROGRESS NOTE    Grant Lawrence  [id number] DOB: Aug 12, 1931 DOA: 10/19/2019 PCP: Orvis Brill, [facility]    Brief Narrative:  Grant Lawrence is an 85 year old male with past medical history significant for dementia, confused at baseline; nonischemic cardiomyopathy with recovered left ventricular ejection fraction, complete Heart block following AV node ablation s/p Pacemaker, permanent A. Fib, diastolic congestive heart failure, hypertension and status post mitral valve annuloplasty.  Patient was transferred from skilled nursing facility due to  mental status change, worsening agitation and fever.  Chest x-ray revealed "new hazy airspace opacity in the left lower lobe suspicious for pneumonia. Resolved right upper lobe and right lower lobe airspace opacities".   Covid test came back positive.  The patient is not able to give any significant history.  The plan is to transfer patient to [facility] for further management.  Assessment & Plan:   Active Problems:   CAD (coronary artery disease)s/p CABG 2005   Mitral valve insufficiency s/p annuloplasty 2005   s/p CRT-D [employer] 2010,gen change Sept 2015   HTN (hypertension)   Tricuspid insufficiency   CHB (complete heart block) s/p AV node ablation   Chronic diastolic heart failure (HCC)   Dementia (HCC)   FTT (failure to thrive) in adult   PNA (pneumonia)  Pneumonia due to COVID-19 infection: -Continue Decadron and remdesivir.   -Start zinc and vitamin C  -Check inflammatory markers daily  -Supportive care  -Transfer patient to [place]  -Further management depend on hospital course.   Acute metabolic encephalopathy:  -Patient has dementia at baseline.   -Treat current acute organic processes.   -Manage behavioral problems expectantly.   -Further management will depend on hospital course.    Coronary artery disease:  -Stable.    Dementia:  -Continue with Aricept, Depakote.  Macrocytosis:   -Possible underlying myelodysplastic syndrome.   -PCP can follow-up on discharge, depending on goal of care!    History of A. fib:  Continue with Eliquis.  BPH:  Continue with Flomax and Proscar.  pyuria:  -Urine culture grew Staphylococcus epidermidis 60,000 CFU per male (query significance).   -Follow blood cultures.   DVT Prophylaxis: Eliquis Code Status: DNR.  Family Communication:  Disposition Plan:  Patient to be transferred to [facility].   Antimicrobials:  Antibiotics Given (last 72 hours)    Date/Time Action Medication Dose Rate   10/19/19 1514 New Bag/Given   ceFEPIme (MAXIPIME) 2 g in sodium chloride 0.9 % 100 mL IVPB 2 g 200 mL/hr   10/19/19 1607 New Bag/Given   metroNIDAZOLE (FLAGYL) IVPB 500 mg 500 mg 100 mL/hr   10/19/19 1803 New Bag/Given   azithromycin (ZITHROMAX) 500 mg in sodium chloride 0.9 % 250 mL IVPB 500 mg 250 mL/hr   10/19/19 2154 New Bag/Given   cefTRIAXone (ROCEPHIN) 1 g in sodium chloride 0.9 % 100 mL IVPB 1 g 200 mL/hr   10/20/19 1622 New Bag/Given   remdesivir 200 mg in sodium chloride 0.9 % 250 mL IVPB 200 mg 500 mL/hr   10/20/19 1816 New Bag/Given   azithromycin (ZITHROMAX) 500 mg in sodium chloride 0.9 % 250 mL IVPB 500 mg 250 mL/hr   10/20/19 2104 New Bag/Given   cefTRIAXone (ROCEPHIN) 1 g in sodium chloride 0.9 % 100 mL IVPB 1 g 200 mL/hr         Subjective: Patient is unable to give any significant history.  Objective: Vitals:   10/20/19 2220 10/21/19 0300 10/21/19 0443 10/21/19  0946  BP: 119/76 (!) 148/117 140/88 (!) 143/64  Pulse: 74 73 71 77  Resp:  18  18  Temp:  97.8 F (36.6 C)  99 F (37.2 C)  TempSrc:  Axillary    SpO2: 98% 100%  97%  Weight:      Height:        Intake/Output Summary (Last 24 hours) at 10/21/2019 1229 Last data filed at 10/21/2019 1100 Gross per 24 hour  Intake 1363.2 ml  Output 1710 ml  Net -346.8 ml   Filed Weights   10/19/19 1735  Weight: 63.5 kg    Examination:   General exam: Not in any distress. Respiratory system: Decreased air entry. Cardiovascular system: S1 & S2.  Gastrointestinal system: Abdomen is nondistended, soft. Normal bowel sounds heard. Central nervous system: Awake and alert.  Patient moves all extremities. Extremities: No edema  Data Reviewed: I have personally reviewed following labs and imaging studies  CBC: Recent Labs  Lab 10/19/19 1408 10/20/19 0346 10/21/19 0318  WBC 6.2 5.2 4.6  NEUTROABS 5.1  --   --   HGB 14.8 12.5* 12.9*  HCT 46.6 39.9 40.8  MCV 103.8* 105.8* 104.6*  PLT 115* 82* 80*   Basic Metabolic Panel: Recent Labs  Lab 10/19/19 1408 10/20/19 0346 10/21/19 0318  NA 141 143 142  K 4.1 4.3 4.0  CL 105 110 109  CO2 27 26 25   GLUCOSE 105* 105* 120*  BUN 19 18 17   CREATININE 0.72 0.63 0.61  CALCIUM 9.6 9.0 9.3   GFR: Estimated Creatinine Clearance: 59.5 mL/min (by C-G formula based on SCr of 0.61 mg/dL). Liver Function Tests: Recent Labs  Lab 10/19/19 1408 10/20/19 0346 10/21/19 0318  AST 34 28 27  ALT 18 16 15   ALKPHOS 71 53 54  BILITOT 1.2 0.7 0.9  PROT 7.4 5.9* 5.9*  ALBUMIN 3.9 2.9* 2.9*   No results for input(s): LIPASE, AMYLASE in the last 168 hours. No results for input(s): AMMONIA in the last 168 hours. Coagulation Profile: Recent Labs  Lab 10/19/19 1408  INR 1.2   Cardiac Enzymes: No results for input(s): CKTOTAL, CKMB, CKMBINDEX, TROPONINI in the last 168 hours. BNP (last 3 results) No results for input(s): PROBNP in the last 8760 hours. HbA1C: No results for input(s): HGBA1C in the last 72 hours. CBG: No results for input(s): GLUCAP in the last 168 hours. Lipid Profile: No results for input(s): CHOL, HDL, LDLCALC, TRIG, CHOLHDL, LDLDIRECT in the last 72 hours. Thyroid Function Tests: No results for input(s): TSH, T4TOTAL, FREET4, T3FREE, THYROIDAB in the last 72 hours. Anemia Panel: Recent Labs    10/19/19 [id number]   Sepsis Labs: Recent Labs  Lab  10/19/19 1408 10/19/19 1604  LATICACIDVEN 2.3* 1.6    Recent Results (from the past 240 hour(s))  Blood Culture (routine x 2)     Status: None (Preliminary result)   Collection Time: 10/19/19  2:09 PM   Specimen: Site Not Specified; Blood  Result Value Ref Range Status   Specimen Description   Final    SITE NOT SPECIFIED Performed at [facility] [address], [place], [place] [address]    Special Requests   Final    BOTTLES DRAWN AEROBIC AND ANAEROBIC Blood Culture results may not be optimal due to an inadequate volume of blood received in culture bottles Performed at [facility] [address] [place], [place] [address]    Culture   Final  NO GROWTH 2 DAYS Performed at [facility], [place] [address] [place], [place] [address]    Report Status PENDING  Incomplete  Blood Culture (routine x 2)     Status: None (Preliminary result)   Collection Time: 10/19/19  2:09 PM   Specimen: Site Not Specified; Blood  Result Value Ref Range Status   Specimen Description   Final    SITE NOT SPECIFIED Performed at [facility] [address], [place], [place] [address]    Special Requests   Final    BOTTLES DRAWN AEROBIC AND ANAEROBIC Blood Culture results may not be optimal due to an inadequate volume of blood received in culture bottles Performed at [facility] [address] [place], [place] [address]    Culture   Final    NO GROWTH 2 DAYS Performed at [facility] [address] [place], [place] [address]    Report Status PENDING  Incomplete  Urine culture     Status: Abnormal (Preliminary result)   Collection Time: 10/19/19  2:09 PM   Specimen: In/Out Cath Urine  Result Value Ref Range Status   Specimen Description   Final    IN/OUT CATH URINE Performed at [facility] [address], [place], [place] [address]    Special Requests   Final    NONE Performed  at [facility], [place] [address], [place], [place] [address]    Culture (A)  Final    60,000 COLONIES/mL STAPHYLOCOCCUS EPIDERMIDIS SUSCEPTIBILITIES TO FOLLOW Performed at [facility], [place] [address], [place], [place] [address]    Report Status PENDING  Incomplete  SARS CORONAVIRUS 2 (TAT 6-24 HRS) Nasopharyngeal Nasopharyngeal Swab     Status: Abnormal   Collection Time: 10/19/19  2:09 PM   Specimen: Nasopharyngeal Swab  Result Value Ref Range Status   SARS Coronavirus 2 POSITIVE (A) NEGATIVE Final    Comment: RESULT CALLED TO, READ BACK BY AND VERIFIED WITH: Gray Bernhardt, RN AT 2206 ON 10/19/2019 BY SAINVILUS S (NOTE) SARS-CoV-2 target nucleic acids are DETECTED. The SARS-CoV-2 RNA is generally detectable in upper and lower respiratory specimens during the acute phase of infection. Positive results are indicative of active infection with SARS-CoV-2. Clinical  correlation with patient history and other diagnostic information is necessary to determine patient infection status. Positive results do  not rule out bacterial infection or co-infection with other viruses. The expected result is Negative. Fact Sheet for Patients: SugarRoll.be Fact Sheet for Healthcare Providers: https://www.woods-mathews.com/ This test is not yet approved or cleared by the [place] FDA and  has been authorized for detection and/or diagnosis of SARS-CoV-2 by FDA under an Emergency Use Authorization (EUA). This EUA will remain  in effect (meaning this test can b e used) for the duration of the COVID-19 declaration under Section 564(b)(1) of the Act, 21 U.S.C. section 360bbb-3(b)(1), unless the authorization is terminated or revoked sooner. Performed at [facility], [place] [address], [place],  [address]   MRSA PCR Screening     Status: None   Collection Time: 10/19/19  5:53 PM   Specimen: Nasal Mucosa; Nasopharyngeal  Result  Value Ref Range Status   MRSA by PCR NEGATIVE NEGATIVE Final    Comment:        The GeneXpert MRSA Assay (FDA approved for NASAL specimens only), is one component of a comprehensive MRSA colonization surveillance program. It is not intended to diagnose MRSA infection  nor to guide or monitor treatment for MRSA infections. Performed at [facility], [place] [address], [place], [place] [address]   Respiratory Panel by PCR     Status: None   Collection Time: 10/20/19  3:13 PM   Specimen: Nasopharyngeal Swab; Respiratory  Result Value Ref Range Status   Adenovirus NOT DETECTED NOT DETECTED Final   Coronavirus 229E NOT DETECTED NOT DETECTED Final    Comment: (NOTE) The Coronavirus on the Respiratory Panel, DOES NOT test for the novel  Coronavirus (2019 nCoV)    Coronavirus HKU1 NOT DETECTED NOT DETECTED Final   Coronavirus NL63 NOT DETECTED NOT DETECTED Final   Coronavirus OC43 NOT DETECTED NOT DETECTED Final   Metapneumovirus NOT DETECTED NOT DETECTED Final   Rhinovirus / Enterovirus NOT DETECTED NOT DETECTED Final   Influenza A NOT DETECTED NOT DETECTED Final   Influenza B NOT DETECTED NOT DETECTED Final   Parainfluenza Virus 1 NOT DETECTED NOT DETECTED Final   Parainfluenza Virus 2 NOT DETECTED NOT DETECTED Final   Parainfluenza Virus 3 NOT DETECTED NOT DETECTED Final   Parainfluenza Virus 4 NOT DETECTED NOT DETECTED Final   Respiratory Syncytial Virus NOT DETECTED NOT DETECTED Final   Bordetella pertussis NOT DETECTED NOT DETECTED Final   Chlamydophila pneumoniae NOT DETECTED NOT DETECTED Final   Mycoplasma pneumoniae NOT DETECTED NOT DETECTED Final    Comment: Performed at [facility], [place]. [address] [place], [place] [address]         Radiology Studies: Dg Chest Port 1 View  Result Date: 10/19/2019 CLINICAL DATA:  Cough, altered mental status EXAM: PORTABLE CHEST 1 VIEW COMPARISON:  01/12/2019 FINDINGS: Left-sided implanted cardiac device with  new generator. Leads in unchanged positioning. Median sternotomy with cardiac valve replacement. Stable cardiomegaly. Previously seen right upper lobe and right lower lobe airspace opacities have resolved. There is a hazy left lower lobe airspace opacity, new from prior. No pleural effusion or pneumothorax. IMPRESSION: 1. New hazy airspace opacity in the left lower lobe suspicious for pneumonia. 2. Resolved right upper lobe and right lower lobe airspace opacities. Electronically Signed   By: Davina Poke M.D.   On: 10/19/2019 15:03        Scheduled Meds: . apixaban  5 mg Oral BID  . brimonidine  1 drop Both Eyes TID  . carvedilol  3.125 mg Oral BID  . dexamethasone  6 mg Oral Daily  . divalproex  125 mg Oral BID  . donepezil  10 mg Oral QHS  . folic acid  1 mg Oral Daily  . guaiFENesin  15 mL Oral Q6H  . mirtazapine  15 mg Oral QHS  . multivitamin with minerals  1 tablet Oral Daily  . tamsulosin  0.4 mg Oral QHS   Continuous Infusions: . sodium chloride 75 mL/hr at 10/20/19 1103  . azithromycin 500 mg (10/20/19 1816)  . cefTRIAXone (ROCEPHIN)  IV 1 g (10/20/19 2104)  . remdesivir 100 mg in NS 250 mL       LOS: 2 days       Bonnell Public, MD [facility] Pager on [place]  If 7PM-7AM, please contact night-coverage [email or web address] Password [facility] 10/21/2019, 12:29 PM

## 2019-10-22 DIAGNOSIS — J9601 Acute respiratory failure with hypoxia: Secondary | ICD-10-CM

## 2019-10-22 DIAGNOSIS — R627 Adult failure to thrive: Secondary | ICD-10-CM

## 2019-10-22 DIAGNOSIS — J1289 Other viral pneumonia: Secondary | ICD-10-CM

## 2019-10-22 LAB — COMPREHENSIVE METABOLIC PANEL
ALT: 13 U/L (ref 0–44)
AST: 27 U/L (ref 15–41)
Albumin: 2.6 g/dL — ABNORMAL LOW (ref 3.5–5.0)
Alkaline Phosphatase: 52 U/L (ref 38–126)
Anion gap: 8 (ref 5–15)
BUN: 20 mg/dL (ref 8–23)
CO2: 26 mmol/L (ref 22–32)
Calcium: 9.4 mg/dL (ref 8.9–10.3)
Chloride: 111 mmol/L (ref 98–111)
Creatinine, Ser: 0.63 mg/dL (ref 0.61–1.24)
GFR calc Af Amer: >60 mL/min (ref >60)
GFR calc non Af Amer: >60 mL/min (ref >60)
Glucose, Bld: 128 mg/dL — ABNORMAL HIGH (ref 70–99)
Potassium: 4.6 mmol/L (ref 3.5–5.1)
Sodium: 145 mmol/L (ref 135–145)
Total Bilirubin: 0.5 mg/dL (ref 0.3–1.2)
Total Protein: 5.5 g/dL — ABNORMAL LOW (ref 6.5–8.1)

## 2019-10-22 LAB — CBC
HCT: 37.9 % — ABNORMAL LOW (ref 39.0–52.0)
Hemoglobin: 12 g/dL — ABNORMAL LOW (ref 13.0–17.0)
MCH: 33 pg (ref 26.0–34.0)
MCHC: 31.7 g/dL (ref 30.0–36.0)
MCV: 104.1 fL — ABNORMAL HIGH (ref 80.0–100.0)
Platelets: 104 10*3/uL — ABNORMAL LOW (ref 150–400)
RBC: 3.64 MIL/uL — ABNORMAL LOW (ref 4.22–5.81)
RDW: 12.8 % (ref 11.5–15.5)
WBC: 5.6 10*3/uL (ref 4.0–10.5)
nRBC: 0 % (ref 0.0–0.2)

## 2019-10-22 LAB — PROCALCITONIN: Procalcitonin: 0.29 ng/mL

## 2019-10-22 LAB — D-DIMER, QUANTITATIVE: D-Dimer, Quant: 1 ug/mL-FEU — ABNORMAL HIGH (ref 0.00–0.50)

## 2019-10-22 LAB — C-REACTIVE PROTEIN: CRP: 10.7 mg/dL — ABNORMAL HIGH (ref <1.0)

## 2019-10-22 LAB — FIBRINOGEN: Fibrinogen: 511 mg/dL — ABNORMAL HIGH (ref 210–475)

## 2019-10-22 LAB — FERRITIN: Ferritin: 516 ng/mL — ABNORMAL HIGH (ref 24–336)

## 2019-10-22 MED ORDER — RESOURCE THICKENUP CLEAR PO POWD
ORAL | Status: DC | PRN
  Filled 2019-10-22: qty 125

## 2019-10-22 NOTE — Progress Notes (Signed)
Spoke with patient's daughter, updated her about patient's condition and answered any questions she had.

## 2019-10-22 NOTE — Evaluation (Signed)
Physical Therapy Evaluation Patient Details Name: Grant Lawrence MRN: [id number] DOB: November 19, 1931 Today's Date: 10/22/2019   History of Present Illness  85 y/o m w/ hx HTN, HLD, Dementia, CAD, CHF, cardiac pacemake, cardiac defibrilator asymptomatic microscopic hematuria, angina ppectoris, a-fib, mitral valve repair/ CABG, dx COVID+ 11/10  Clinical Impression   Pt admitted with above diagnosis. Pt quite confused and not able to give hx. Chart review revealed pt confused at baseline w/ above hx. Pt currently with functional limitations due to the deficits listed below (see PT Problem List). Pt presents with extreme confusion and some combativeness, decreased overall strength, balance and coordination, activity tolerance and independence hence safety w/ mobility. Pt is on room air throughout session and able to maintain sats in 90s. Pt will benefit from skilled PT to increase his independence and safety with mobility to allow discharge to the venue listed below.       Follow Up Recommendations SNF    Equipment Recommendations  None recommended by PT    Recommendations for Other Services       Precautions / Restrictions Precautions Precautions: Fall;ICD/Pacemaker Precaution Comments: cognition. combative Required Braces or Orthoses: Other Brace(has wrist brace R) Restrictions Weight Bearing Restrictions: No      Mobility  Bed Mobility Overal bed mobility: Needs Assistance Bed Mobility: Supine to Sit;Sit to Supine     Supine to sit: Mod assist Sit to supine: Mod assist      Transfers Overall transfer level: Needs assistance Equipment used: 1 person hand held assist Transfers: Sit to/from Stand Sit to Stand: Mod assist            Ambulation/Gait Ambulation/Gait assistance: Mod assist Gait Distance (Feet): 2 Feet Assistive device: 1 person hand held assist Gait Pattern/deviations: Staggering right;Staggering left     General Gait Details: minimallt able to  tolerate  Stairs            Wheelchair Mobility    Modified Rankin (Stroke Patients Only)       Balance Overall balance assessment: Needs assistance Sitting-balance support: Feet unsupported Sitting balance-Leahy Scale: Fair     Standing balance support: Single extremity supported Standing balance-Leahy Scale: Poor                               Pertinent Vitals/Pain Pain Assessment: Faces Faces Pain Scale: No hurt    Home Living Family/patient expects to be discharged to:: Assisted living               Home Equipment: (unable to give details) Additional Comments: no family present and Pt not a good historian    Prior Function Level of Independence: Needs assistance   Gait / Transfers Assistance Needed: Amb with rolling walker  ADL's / Homemaking Assistance Needed: needs a with ADLs        Hand Dominance        Extremity/Trunk Assessment   Upper Extremity Assessment Upper Extremity Assessment: Defer to OT evaluation    Lower Extremity Assessment Lower Extremity Assessment: Generalized weakness       Communication   Communication: Receptive difficulties;Expressive difficulties  Cognition Arousal/Alertness: Lethargic Behavior During Therapy: Restless;Agitated;Anxious Overall Cognitive Status: History of cognitive impairments - at baseline                                 General Comments: pt agitatted and combative  General Comments General comments (skin integrity, edema, etc.): Pt on room air and manages to maintain sats in 90s, no evidence of lincreased work of breathing noted    Exercises     Assessment/Plan    PT Assessment Patient needs continued PT services  PT Problem List Decreased strength;Decreased activity tolerance;Decreased balance;Decreased mobility;Decreased coordination;Decreased cognition;Decreased safety awareness       PT Treatment Interventions Gait training;Functional mobility  training;Therapeutic exercise;Therapeutic activities;Balance training;Neuromuscular re-education;Patient/family education    PT Goals (Current goals can be found in the Care Plan section)  Acute Rehab PT Goals Patient Stated Goal: unable to verbalize goals Time For Goal Achievement: 11/26/19 Potential to Achieve Goals: Fair    Frequency Min 2X/week   Barriers to discharge        Co-evaluation               AM-PAC PT "6 Clicks" Mobility  Outcome Measure Help needed turning from your back to your side while in a flat bed without using bedrails?: A Lot Help needed moving from lying on your back to sitting on the side of a flat bed without using bedrails?: A Lot Help needed moving to and from a bed to a chair (including a wheelchair)?: A Lot Help needed standing up from a chair using your arms (e.g., wheelchair or bedside chair)?: A Lot Help needed to walk in hospital room?: A Lot Help needed climbing 3-5 steps with a railing? : Total 6 Click Score: 11    End of Session   Activity Tolerance: Patient limited by fatigue;Patient limited by lethargy;Treatment limited secondary to agitation Patient left: in bed;with call bell/phone within reach Nurse Communication: Mobility status PT Visit Diagnosis: Other abnormalities of gait and mobility (R26.89);Muscle weakness (generalized) (M62.81)    Time: [id number] PT Time Calculation (min) (ACUTE ONLY): 21 min   Charges:   PT Evaluation $PT Eval Moderate Complexity: [place], PT   Delford Field 10/22/2019, 9:56 AM

## 2019-10-22 NOTE — Progress Notes (Signed)
PROGRESS NOTE                                                                                                                                                                                                             Patient Demographics:    Grant Lawrence, is a 85 y.o. male, DOB - [birth year], ZA:[id number]  Outpatient Primary MD for the patient is [facility]   Admit date - 10/19/2019   LOS - 3  Chief Complaint  Patient presents with  . Dysuria       Brief Narrative: Patient is a 85 y.o. male with PMHx of dementia, chronic diastolic heart failure, complete heart block-s/p permanent pacemaker implantation, permanent atrial fibrillation, HTN, s/p mitral valve annuloplasty-transferred from SNF on 11/10 for evaluation of fever, confusion-further evaluation revealed COVID-19 pneumonia.  See below for further details.   Subjective:    Grant Lawrence today    Assessment  & Plan :   Acute Hypoxic Resp Failure due to Covid 19 Viral pneumonia: Improving-oxygen requirements are coming down-currently on room air.  Continue remdesivir and steroids.  Inflammatory markers remain elevated-we will continue to follow closely.  Fever: afebrile  O2 requirements: On RA  COVID-19 Labs: Recent Labs    10/22/19 0030  DDIMER 1.00*  FERRITIN 516*  CRP 10.7*    Lab Results  Component Value Date   SARSCOV2NAA POSITIVE (A) 10/19/2019     COVID-19 Medications: Steroids: 11/11>> Remdesivir: 11/11>> Actemra: Not given-as patient not significantly hypoxic. Convalescent Plasma: Not given Research Studies:N/A  Other medications: Diuretics:Euvolemic-no need for lasix Antibiotics: No evidence of bacterial pneumonia-stop Rocephin/Zithromax on 11/13.  Prone/Incentive Spirometry: Limited due to dementia  DVT Prophylaxis  : Eliquis  Acute metabolic encephalopathy superimposed on dementia:  encephalopathy likely  secondary to COVID-19 pneumonia-superimposed on dementia with delirium.  Plan is for supportive care.  CAD s/p CABG August 2005 and mitral valve annuloplasty for severe MR: No anginal symptoms-continue supportive care  Permanent atrial fibrillation s/p AV node ablation October 2010-complicated by complete heart block-requiring permanent pacemaker implantation: Rate controlled with Coreg-anticoagulated with Eliquis.  Macrocytosis/thrombocytopenia: Appears to be chronic-we will need outpatient work-up-but suspect further work-up would not change outcome or management given advanced dementia/frailty.  BPH: Continue Flomax  Dementia: Continue Aricept and Depakote  Failure to thrive syndrome: Very frail-deconditioned-appears to have advanced dementia-Per RN-oral intake  just about adequate.  Await PT/OT eval-SLP following for chronic dysphagia-on full liquid-nectar thick diet.  Hard of hearing  ?  UTI: No symptoms-suspect asymptomatic bacteriuria-does not antimicrobial therapy at this point  ABG: No results found for: PHART, PCO2ART, PO2ART, HCO3, TCO2, ACIDBASEDEF, O2SAT  Vent Settings: N/A  Condition - Guarded  Family Communication  :  Daughter updated over the phone  Code Status :  DNR  Diet :  Diet Order            Diet full liquid Room service appropriate? Yes; Fluid consistency: Nectar Thick  Diet effective now               Disposition Plan  :  Remain hospitalized  Barriers to discharge: Complete 5 days of IV Remdesivir  Consults  :  None  Procedures  :  None  Antibiotics  :    Anti-infectives (From admission, onward)   Start     Dose/Rate Route Frequency Ordered Stop   10/23/19 1000  remdesivir 100 mg in sodium chloride 0.9 % 250 mL IVPB     100 mg 500 mL/hr over 30 Minutes Intravenous Every 24 hours 10/21/19 2134 10/25/19 0959   10/22/19 1600  remdesivir 100 mg in sodium chloride 0.9 % 250 mL IVPB     100 mg 500 mL/hr over 30 Minutes Intravenous  Once  10/21/19 2134     10/22/19 1000  remdesivir 100 mg in sodium chloride 0.9 % 250 mL IVPB  Status:  Discontinued     100 mg 500 mL/hr over 30 Minutes Intravenous Every 24 hours 10/21/19 1830 10/21/19 2134   10/21/19 2200  remdesivir 100 mg in sodium chloride 0.9 % 250 mL IVPB  Status:  Discontinued     100 mg 500 mL/hr over 30 Minutes Intravenous Every 24 hours 10/20/19 1555 10/21/19 1830   10/21/19 1900  remdesivir 100 mg in sodium chloride 0.9 % 250 mL IVPB     100 mg 500 mL/hr over 30 Minutes Intravenous  Once 10/21/19 1830 10/22/19 0633   10/20/19 1630  remdesivir 200 mg in sodium chloride 0.9 % 250 mL IVPB     200 mg 500 mL/hr over 30 Minutes Intravenous Once 10/20/19 1555 10/20/19 1652   10/19/19 2200  cefTRIAXone (ROCEPHIN) 1 g in sodium chloride 0.9 % 100 mL IVPB     1 g 200 mL/hr over 30 Minutes Intravenous Every 24 hours 10/19/19 1657 10/24/19 2159   10/19/19 1800  azithromycin (ZITHROMAX) 500 mg in sodium chloride 0.9 % 250 mL IVPB     500 mg 250 mL/hr over 60 Minutes Intravenous Every 24 hours 10/19/19 1657 10/24/19 1559   10/19/19 1415  ceFEPIme (MAXIPIME) 2 g in sodium chloride 0.9 % 100 mL IVPB     2 g 200 mL/hr over 30 Minutes Intravenous  Once 10/19/19 1412 10/19/19 1544   10/19/19 1415  metroNIDAZOLE (FLAGYL) IVPB 500 mg     500 mg 100 mL/hr over 60 Minutes Intravenous  Once 10/19/19 1412 10/19/19 1707   10/19/19 1415  vancomycin (VANCOCIN) IVPB 1000 mg/200 mL premix  Status:  Discontinued     1,000 mg 200 mL/hr over 60 Minutes Intravenous  Once 10/19/19 1412 10/20/19 0236      Inpatient Medications  Scheduled Meds: . apixaban  5 mg Oral BID  . brimonidine  1 drop Both Eyes TID  . carvedilol  3.125 mg Oral BID  . dexamethasone  6 mg Oral Daily  . divalproex  125  mg Oral BID  . donepezil  10 mg Oral QHS  . folic acid  1 mg Oral Daily  . guaiFENesin  15 mL Oral Q6H  . mirtazapine  15 mg Oral QHS  . multivitamin with minerals  1 tablet Oral Daily  . tamsulosin   0.4 mg Oral QHS  . vitamin C  500 mg Oral Daily  . zinc sulfate  220 mg Oral Daily   Continuous Infusions: . sodium chloride 75 mL/hr at 10/21/19 1548  . azithromycin 500 mg (10/21/19 1544)  . cefTRIAXone (ROCEPHIN)  IV 1 g (10/21/19 2325)  . [START ON 10/23/2019] remdesivir 100 mg in NS 250 mL    . remdesivir 100 mg in NS 250 mL     PRN Meds:.acetaminophen **OR** acetaminophen, albuterol, nystatin, ondansetron **OR** ondansetron (ZOFRAN) IV, phenylephrine-shark liver oil-mineral oil-petrolatum, Resource ThickenUp Clear, zinc oxide   Time Spent in minutes  25   See all Orders from today for further details   Oren Binet M.D on 10/22/2019 at 7:01 AM  To page go to [email or web address] - use universal password  [facility] -  Office  [phone]    Objective:   Vitals:   10/21/19 1724 10/21/19 2000 10/22/19 0000 10/22/19 0400  BP:  (!) 131/105 113/63 131/71  Pulse: (!) 110 72 69 78  Resp: (!) 24 19 (!) 25 16  Temp:  97.7 F (36.5 C) 98.2 F (36.8 C) 97.9 F (36.6 C)  TempSrc:  Oral Oral Axillary  SpO2: 96% 95% 94% 96%  Weight:      Height:        Wt Readings from Last 3 Encounters:  10/19/19 63.5 kg  09/01/19 63.1 kg  05/19/19 65.8 kg     Intake/Output Summary (Last 24 hours) at 10/22/2019 0701 Last data filed at 10/22/2019 0600 Gross per 24 hour  Intake 140 ml  Output 2250 ml  Net -2110 ml     Physical Exam Gen Exam: Very confused-but not in any distress.   HEENT:atraumatic, normocephalic Chest: B/L clear to auscultation anteriorly CVS:S1S2 regular Abdomen:soft non tender, non distended Extremities:no edema Neurology: Difficult exam due to dementia but seems to be moving all 4 extremities. Skin: no rash   Data Review:    CBC Recent Labs  Lab 10/19/19 1408 10/20/19 0346 10/21/19 0318  WBC 6.2 5.2 4.6  HGB 14.8 12.5* 12.9*  HCT 46.6 39.9  38.1 40.8  PLT 115* 82* 80*  MCV 103.8* 105.8* 104.6*  MCH 33.0 33.2 33.1  MCHC 31.8 31.3  31.6  RDW 12.8 13.0 12.8  LYMPHSABS 0.5*  --   --   MONOABS 0.4  --   --   EOSABS 0.2  --   --   BASOSABS 0.0  --   --     Chemistries  Recent Labs  Lab 10/19/19 1408 10/20/19 0346 10/21/19 0318  NA 141 143 142  K 4.1 4.3 4.0  CL 105 110 109  CO2 27 26 25   GLUCOSE 105* 105* 120*  BUN 19 18 17   CREATININE 0.72 0.63 0.61  CALCIUM 9.6 9.0 9.3  AST 34 28 27  ALT 18 16 15   ALKPHOS 71 53 54  BILITOT 1.2 0.7 0.9   ------------------------------------------------------------------------------------------------------------------ No results for input(s): CHOL, HDL, LDLCALC, TRIG, CHOLHDL, LDLDIRECT in the last 72 hours.  No results found for: HGBA1C ------------------------------------------------------------------------------------------------------------------ No results for input(s): TSH, T4TOTAL, T3FREE, THYROIDAB in the last 72 hours.  Invalid input(s): FREET3 ------------------------------------------------------------------------------------------------------------------ Recent Labs    10/19/19 1634  10/22/19 0030  VITAMINB12 591  --   FERRITIN  --  516*    Coagulation profile Recent Labs  Lab 10/19/19 1408  INR 1.2    Recent Labs    10/22/19 0030  DDIMER 1.00*    Cardiac Enzymes No results for input(s): CKMB, TROPONINI, MYOGLOBIN in the last 168 hours.  Invalid input(s): CK ------------------------------------------------------------------------------------------------------------------ No results found for: BNP  Micro Results Recent Results (from the past 240 hour(s))  Blood Culture (routine x 2)     Status: None (Preliminary result)   Collection Time: 10/19/19  2:09 PM   Specimen: Site Not Specified; Blood  Result Value Ref Range Status   Specimen Description   Final    SITE NOT SPECIFIED Performed at [facility] [address], [place], [place] [address]    Special Requests   Final    BOTTLES DRAWN AEROBIC AND ANAEROBIC  Blood Culture results may not be optimal due to an inadequate volume of blood received in culture bottles Performed at [facility] [address] [place], [place] [address]    Culture   Final    NO GROWTH 2 DAYS Performed at [facility] [address] [place], [place] [address]    Report Status PENDING  Incomplete  Blood Culture (routine x 2)     Status: None (Preliminary result)   Collection Time: 10/19/19  2:09 PM   Specimen: Site Not Specified; Blood  Result Value Ref Range Status   Specimen Description   Final    SITE NOT SPECIFIED Performed at [facility] [address], [place], [place] [address]    Special Requests   Final    BOTTLES DRAWN AEROBIC AND ANAEROBIC Blood Culture results may not be optimal due to an inadequate volume of blood received in culture bottles Performed at [facility] [address], [place], [place] [address]    Culture   Final    NO GROWTH 2 DAYS Performed at [facility] [address], [place], [place] [address]    Report Status PENDING  Incomplete  Urine culture     Status: Abnormal   Collection Time: 10/19/19  2:09 PM   Specimen: In/Out Cath Urine  Result Value Ref Range Status   Specimen Description   Final    IN/OUT CATH URINE Performed at [facility] [address] [place], [place] [address]    Special Requests   Final    NONE Performed at [facility], [place] [address], [place], [place] [address]    Culture 60,000 COLONIES/mL STAPHYLOCOCCUS EPIDERMIDIS (A)  Final   Report Status 10/21/2019 FINAL  Final   Organism ID, Bacteria STAPHYLOCOCCUS EPIDERMIDIS (A)  Final      Susceptibility   Staphylococcus epidermidis - MIC*    CIPROFLOXACIN <=0.5 SENSITIVE Sensitive     GENTAMICIN <=0.5 SENSITIVE Sensitive     NITROFURANTOIN <=16 SENSITIVE Sensitive     OXACILLIN RESISTANT Resistant     TETRACYCLINE 2 SENSITIVE Sensitive      VANCOMYCIN 1 SENSITIVE Sensitive     TRIMETH/SULFA <=10 SENSITIVE Sensitive     CLINDAMYCIN <=0.25 SENSITIVE Sensitive     RIFAMPIN <=0.5 SENSITIVE Sensitive     Inducible Clindamycin NEGATIVE Sensitive     * 60,000 COLONIES/mL STAPHYLOCOCCUS EPIDERMIDIS  SARS CORONAVIRUS 2 (TAT 6-24 HRS) Nasopharyngeal Nasopharyngeal Swab     Status: Abnormal   Collection Time: 10/19/19  2:09 PM  Specimen: Nasopharyngeal Swab  Result Value Ref Range Status   SARS Coronavirus 2 POSITIVE (A) NEGATIVE Final    Comment: RESULT CALLED TO, READ BACK BY AND VERIFIED WITH: Gray Bernhardt, RN AT 2206 ON 10/19/2019 BY SAINVILUS S (NOTE) SARS-CoV-2 target nucleic acids are DETECTED. The SARS-CoV-2 RNA is generally detectable in upper and lower respiratory specimens during the acute phase of infection. Positive results are indicative of active infection with SARS-CoV-2. Clinical  correlation with patient history and other diagnostic information is necessary to determine patient infection status. Positive results do  not rule out bacterial infection or co-infection with other viruses. The expected result is Negative. Fact Sheet for Patients: SugarRoll.be Fact Sheet for Healthcare Providers: https://www.woods-mathews.com/ This test is not yet approved or cleared by the [place] FDA and  has been authorized for detection and/or diagnosis of SARS-CoV-2 by FDA under an Emergency Use Authorization (EUA). This EUA will remain  in effect (meaning this test can b e used) for the duration of the COVID-19 declaration under Section 564(b)(1) of the Act, 21 U.S.C. section 360bbb-3(b)(1), unless the authorization is terminated or revoked sooner. Performed at [facility], [place] [address], [place], [place] [address]   MRSA PCR Screening     Status: None   Collection Time: 10/19/19  5:53 PM   Specimen: Nasal Mucosa; Nasopharyngeal  Result Value Ref Range Status   MRSA by  PCR NEGATIVE NEGATIVE Final    Comment:        The GeneXpert MRSA Assay (FDA approved for NASAL specimens only), is one component of a comprehensive MRSA colonization surveillance program. It is not intended to diagnose MRSA infection nor to guide or monitor treatment for MRSA infections. Performed at [facility], [place] [address], [place], [place] [address]   Respiratory Panel by PCR     Status: None   Collection Time: 10/20/19  3:13 PM   Specimen: Nasopharyngeal Swab; Respiratory  Result Value Ref Range Status   Adenovirus NOT DETECTED NOT DETECTED Final   Coronavirus 229E NOT DETECTED NOT DETECTED Final    Comment: (NOTE) The Coronavirus on the Respiratory Panel, DOES NOT test for the novel  Coronavirus (2019 nCoV)    Coronavirus HKU1 NOT DETECTED NOT DETECTED Final   Coronavirus NL63 NOT DETECTED NOT DETECTED Final   Coronavirus OC43 NOT DETECTED NOT DETECTED Final   Metapneumovirus NOT DETECTED NOT DETECTED Final   Rhinovirus / Enterovirus NOT DETECTED NOT DETECTED Final   Influenza A NOT DETECTED NOT DETECTED Final   Influenza B NOT DETECTED NOT DETECTED Final   Parainfluenza Virus 1 NOT DETECTED NOT DETECTED Final   Parainfluenza Virus 2 NOT DETECTED NOT DETECTED Final   Parainfluenza Virus 3 NOT DETECTED NOT DETECTED Final   Parainfluenza Virus 4 NOT DETECTED NOT DETECTED Final   Respiratory Syncytial Virus NOT DETECTED NOT DETECTED Final   Bordetella pertussis NOT DETECTED NOT DETECTED Final   Chlamydophila pneumoniae NOT DETECTED NOT DETECTED Final   Mycoplasma pneumoniae NOT DETECTED NOT DETECTED Final    Comment: Performed at [facility], [place]. [address] [place], [place] [address]    Radiology Reports Dg Chest Port 1 View  Result Date: 10/19/2019 CLINICAL DATA:  Cough, altered mental status EXAM: PORTABLE CHEST 1 VIEW COMPARISON:  01/12/2019 FINDINGS: Left-sided implanted cardiac device with new generator. Leads in unchanged  positioning. Median sternotomy with cardiac valve replacement. Stable cardiomegaly. Previously seen right upper lobe and right lower lobe airspace opacities have resolved. There is  a hazy left lower lobe airspace opacity, new from prior. No pleural effusion or pneumothorax. IMPRESSION: 1. New hazy airspace opacity in the left lower lobe suspicious for pneumonia. 2. Resolved right upper lobe and right lower lobe airspace opacities. Electronically Signed   By: Davina Poke M.D.   On: 10/19/2019 15:03

## 2019-10-22 NOTE — Progress Notes (Signed)
  Speech Language Pathology Treatment: Dysphagia  Patient Details Name: Grant Lawrence MRN: [id number] DOB: 1930/12/14 Today's Date: 10/22/2019 Time: 1217-1228 SLP Time Calculation (min) (ACUTE ONLY): 11 min  Assessment / Plan / Recommendation Clinical Impression  Pt alert but requires cueing, respiratory function appears stable. He is reluctant to accept PO, said "That's enough" several times during session. He cognition was adequate for cup and straw sips, soft bite of puree and solid. Oral phase timely despite dry oral mucosa. One soft cough after nectar. Recommend pt upgrade to finley chopped solids and nectar thick liquids, hopeful that his appetite will increase with a hot meal. He does need assist with feeding but no SLP f/u needed at he is at his baseline function.   HPI HPI: 85 yo male adm to [facility] with fever and found to have pna.  PT resides at [facility] and has a MOST for signed in January 2020.  PMH + for dementia, CAD, AV block s/p pacemaker.  Imaging with this admit showed right upper and lower lobe opacities with resolved left LL ASD.  Imaging also showed sinusitis extensive.  Of note, pt also with h/o possible pna 08/2018 and 01/2019.  Pt has tested positive for COVID.  Swallow eval ordered.      SLP Plan  All goals met       Recommendations  Diet recommendations: Nectar-thick liquid;Dysphagia 2 (fine chop) Medication Administration: Crushed with puree Supervision: Staff to assist with self feeding;Full supervision/cueing for compensatory strategies Compensations: Slow rate;Small sips/bites Postural Changes and/or Swallow Maneuvers: Seated upright 90 degrees                Oral Care Recommendations: Oral care BID Follow up Recommendations: Skilled Nursing facility SLP Visit Diagnosis: Dysphagia, oropharyngeal phase (R13.12) Plan: All goals met       GO               Grant Baltimore, MA CCC-SLP  Acute Rehabilitation Services Pager [phone] Office  [phone]  Grant Lawrence 10/22/2019, 12:31 PM

## 2019-10-23 LAB — COMPREHENSIVE METABOLIC PANEL
ALT: 19 U/L (ref 0–44)
AST: 29 U/L (ref 15–41)
Albumin: 2.9 g/dL — ABNORMAL LOW (ref 3.5–5.0)
Alkaline Phosphatase: 54 U/L (ref 38–126)
Anion gap: 8 (ref 5–15)
BUN: 20 mg/dL (ref 8–23)
CO2: 29 mmol/L (ref 22–32)
Calcium: 9.4 mg/dL (ref 8.9–10.3)
Chloride: 104 mmol/L (ref 98–111)
Creatinine, Ser: 0.56 mg/dL — ABNORMAL LOW (ref 0.61–1.24)
GFR calc Af Amer: >60 mL/min (ref >60)
GFR calc non Af Amer: >60 mL/min (ref >60)
Glucose, Bld: 120 mg/dL — ABNORMAL HIGH (ref 70–99)
Potassium: 4 mmol/L (ref 3.5–5.1)
Sodium: 141 mmol/L (ref 135–145)
Total Bilirubin: 0.9 mg/dL (ref 0.3–1.2)
Total Protein: 6 g/dL — ABNORMAL LOW (ref 6.5–8.1)

## 2019-10-23 LAB — D-DIMER, QUANTITATIVE: D-Dimer, Quant: 1.35 ug/mL-FEU — ABNORMAL HIGH (ref 0.00–0.50)

## 2019-10-23 LAB — CBC
HCT: 41.4 % (ref 39.0–52.0)
Hemoglobin: 13.5 g/dL (ref 13.0–17.0)
MCH: 33 pg (ref 26.0–34.0)
MCHC: 32.6 g/dL (ref 30.0–36.0)
MCV: 101.2 fL — ABNORMAL HIGH (ref 80.0–100.0)
Platelets: 124 10*3/uL — ABNORMAL LOW (ref 150–400)
RBC: 4.09 MIL/uL — ABNORMAL LOW (ref 4.22–5.81)
RDW: 12.5 % (ref 11.5–15.5)
WBC: 6.1 10*3/uL (ref 4.0–10.5)
nRBC: 0 % (ref 0.0–0.2)

## 2019-10-23 LAB — FERRITIN: Ferritin: 449 ng/mL — ABNORMAL HIGH (ref 24–336)

## 2019-10-23 LAB — C-REACTIVE PROTEIN: CRP: 6.2 mg/dL — ABNORMAL HIGH (ref <1.0)

## 2019-10-23 NOTE — Progress Notes (Signed)
Spoke with patient's daughter, updated her about patient's condition and answered any questions she had.

## 2019-10-23 NOTE — NC FL2 (Signed)
[place] LEVEL OF CARE SCREENING TOOL     IDENTIFICATION  Patient Name: Grant Lawrence Birthdate: 1931/06/20 Sex: male Admission Date (Current Location): 10/19/2019  [facility] and [place] Number:  Herbalist and Address:  [facility], [place] [address], [place], [place] [address]      Provider Number: [id number]  Attending Physician Name and Address:  Jonetta Osgood, MD  Relative Name and Phone Number:  [name], daughter, [phone]    Current Level of Care: Hospital Recommended Level of Care: [place] Prior Approval Number:    Date Approved/Denied:   PASRR Number:    Discharge Plan: Other (Comment)(ALF)    Current Diagnoses: Patient Active Problem List   Diagnosis Date Noted  . PNA (pneumonia) 10/19/2019  . Pacemaker battery depletion 05/19/2019  . ICD (implantable cardioverter-defibrillator) battery depletion   . FTT (failure to thrive) in adult   . Community acquired pneumonia of both lower lobes 01/13/2019  . Nausea and vomiting 01/13/2019  . Acute respiratory failure with hypoxia ([place]) 01/13/2019  . Recurrent aspiration pneumonia ([place]) 08/28/2018  . Coronary artery disease involving coronary bypass graft of native heart without angina pectoris 03/06/2018  . Hypercholesterolemia 03/06/2018  . Dementia ([place]) 09/09/2017  . BPH (benign prostatic hyperplasia) 07/22/2016  . Elevated PSA 07/22/2016  . PVCs (premature ventricular contractions) 07/03/2016  . History of repair of mitral valve 07/03/2016  . CAD (coronary artery disease)s/p CABG 2005 08/13/2014  . Mitral valve insufficiency s/p annuloplasty 2005 08/13/2014  . s/p CRT-D St Jude 2010,gen change Sept 2015 08/13/2014  . Elective replacement indicated for implantable cardioverter-defibrillator (ICD) reached July 03, 2014 08/13/2014  . HTN (hypertension) 08/13/2014  . Mixed hyperlipidemia 08/13/2014  . Aortic insufficiency 08/13/2014  .  Tricuspid insufficiency 08/13/2014  . Statin intolerance 08/13/2014  . CHB (complete heart block) s/p AV node ablation 08/13/2014  . Chronic diastolic heart failure ([place]) 08/13/2014  . A-fib ([place]) 08/12/2014    Orientation RESPIRATION BLADDER Height & Weight     Self  Normal External catheter, Incontinent(placed 11/10) Weight: 139 lb 15.9 oz (63.5 kg) Height:  6' (182.9 cm)  BEHAVIORAL SYMPTOMS/MOOD NEUROLOGICAL BOWEL NUTRITION STATUS      Incontinent Diet(DYS 2 diet)  AMBULATORY STATUS COMMUNICATION OF NEEDS Skin   Limited Assist   Normal                       Personal Care Assistance Level of Assistance  Bathing, Feeding, Dressing Bathing Assistance: Limited assistance Feeding assistance: Independent Dressing Assistance: Limited assistance     Functional Limitations Info  Sight, Hearing, Speech Sight Info: Adequate Hearing Info: Adequate Speech Info: Adequate    SPECIAL CARE FACTORS FREQUENCY  PT (By licensed PT), OT (By licensed OT)     PT Frequency: 3x OT Frequency: 3x            Contractures Contractures Info: Not present    Additional Factors Info  Code Status, Allergies Code Status Info: DNR Allergies Info: Rosuvastatin Calcium, Statins           Current Medications (10/23/2019):  This is the current hospital active medication list Current Facility-Administered Medications  Medication Dose Route Frequency Provider Last Rate Last Dose  . 0.9 %  sodium chloride infusion   Intravenous Continuous Jonetta Osgood, MD 10 mL/hr at 10/23/19 1411    . acetaminophen (TYLENOL) tablet 650 mg  650 mg Oral Q6H PRN Regalado, Cassie Freer, MD  Or  . acetaminophen (TYLENOL) suppository 650 mg  650 mg Rectal Q6H PRN Regalado, Belkys A, MD      . albuterol (VENTOLIN HFA) 108 (90 Base) MCG/ACT inhaler 1 puff  1 puff Inhalation Q2H PRN Regalado, Belkys A, MD      . apixaban (ELIQUIS) tablet 5 mg  5 mg Oral BID Regalado, Belkys A, MD   5 mg at 10/23/19 1021  .  brimonidine (ALPHAGAN) 0.2 % ophthalmic solution 1 drop  1 drop Both Eyes TID Regalado, Belkys A, MD   1 drop at 10/23/19 1530  . carvedilol (COREG) tablet 3.125 mg  3.125 mg Oral BID Regalado, Belkys A, MD   3.125 mg at 10/23/19 1021  . dexamethasone (DECADRON) tablet 6 mg  6 mg Oral Daily Matcha, Anupama, MD   6 mg at 10/23/19 1021  . divalproex (DEPAKOTE SPRINKLE) capsule 125 mg  125 mg Oral BID Matcha, Anupama, MD   125 mg at 10/23/19 1021  . donepezil (ARICEPT) tablet 10 mg  10 mg Oral QHS Regalado, Belkys A, MD   10 mg at [id number]  . folic acid (FOLVITE) tablet 1 mg  1 mg Oral Daily Regalado, Belkys A, MD   1 mg at 10/23/19 1021  . guaiFENesin (ROBITUSSIN) 100 MG/5ML solution 300 mg  15 mL Oral Q6H Matcha, Anupama, MD   300 mg at 10/23/19 1530  . mirtazapine (REMERON) tablet 15 mg  15 mg Oral QHS Regalado, Belkys A, MD   15 mg at 10/22/19 2124  . multivitamin with minerals tablet 1 tablet  1 tablet Oral Daily Regalado, Belkys A, MD   1 tablet at 10/23/19 1021  . nystatin (MYCOSTATIN/NYSTOP) topical powder 1 g  1 g Topical Q6H PRN Regalado, Belkys A, MD      . ondansetron (ZOFRAN) tablet 4 mg  4 mg Oral Q6H PRN Regalado, Belkys A, MD       Or  . ondansetron (ZOFRAN) injection 4 mg  4 mg Intravenous Q6H PRN Regalado, Belkys A, MD      . phenylephrine-shark liver oil-mineral oil-petrolatum (PREPARATION H) rectal ointment 1 application  1 application Rectal BID PRN Regalado, Belkys A, MD      . remdesivir 100 mg in sodium chloride 0.9 % 250 mL IVPB  100 mg Intravenous Q24H Bonnell Public, MD   Stopped at 10/23/19 1130  . Resource ThickenUp Clear   Oral PRN Yaakov Guthrie, MD      . Resource ThickenUp Clear   Oral PRN Jonetta Osgood, MD      . tamsulosin [facility]) capsule 0.4 mg  0.4 mg Oral QHS Regalado, Belkys A, MD   0.4 mg at 10/22/19 2124  . vitamin C (ASCORBIC ACID) tablet 500 mg  500 mg Oral Daily Dana Allan I, MD   500 mg at 10/23/19 1021  . zinc oxide 20 % ointment    Topical PRN Regalado, Belkys A, MD      . zinc sulfate capsule 220 mg  220 mg Oral Daily Dana Allan I, MD   220 mg at 10/23/19 1021     Discharge Medications: Please see discharge summary for a list of discharge medications.  Relevant Imaging Results:  Relevant Lab Results:   Additional Information [id number]  Gerrianne Scale Phelan Goers, LCSW

## 2019-10-23 NOTE — Progress Notes (Signed)
PROGRESS NOTE                                                                                                                                                                                                             Patient Demographics:    Grant Lawrence, is a 85 y.o. male, DOB - 20-Oct-1931, [id number]  Outpatient Primary MD for the patient is [facility]   Admit date - 10/19/2019   LOS - 4  Chief Complaint  Patient presents with  . Dysuria       Brief Narrative: Patient is a 85 y.o. male with PMHx of dementia, chronic diastolic heart failure, complete heart block-s/p permanent pacemaker implantation, permanent atrial fibrillation, HTN, s/p mitral valve annuloplasty-transferred from SNF on 11/10 for evaluation of fever, confusion-further evaluation revealed COVID-19 pneumonia.  See below for further details.   Subjective:    Grant Lawrence today was lying comfortably in bed-remains very pleasantly confused.   Assessment  & Plan :   Acute Hypoxic Resp Failure due to Covid 19 Viral pneumonia: Continues to slowly improve-remains on steroids and remdesivir.  CRP is downtrending.  Follow.  .  Fever: afebrile  O2 requirements: On RA  COVID-19 Labs: Recent Labs    10/22/19 0030 10/23/19 0443  DDIMER 1.00* 1.35*  FERRITIN 516* 449*  CRP 10.7* 6.2*    Lab Results  Component Value Date   SARSCOV2NAA POSITIVE (A) 10/19/2019     COVID-19 Medications: Steroids: 11/11>> Remdesivir: 11/11>> Actemra: Not given-as patient not significantly hypoxic. Convalescent Plasma: Not given Research Studies:N/A  Other medications: Diuretics:Euvolemic-no need for lasix Antibiotics: No evidence of bacterial pneumonia-stop Rocephin/Zithromax on 11/13.  Prone/Incentive Spirometry: Limited due to dementia  DVT Prophylaxis  : Eliquis  Acute metabolic encephalopathy superimposed on dementia:  encephalopathy  likely secondary to COVID-19 pneumonia-superimposed on dementia with delirium.  Suspect encephalopathy has improved-do not think he is that far from his usual baseline.  CAD s/p CABG August 2005 and mitral valve annuloplasty for severe MR: No anginal symptoms-continue supportive care  Permanent atrial fibrillation s/p AV node ablation October 2010-complicated by complete heart block-requiring permanent pacemaker implantation: Rate controlled with Coreg-anticoagulated with Eliquis.  Macrocytosis/thrombocytopenia: Appears to be chronic-we will need outpatient work-up-but suspect further work-up would not change outcome or management given advanced dementia/frailty.  BPH: Continue Flomax  Dementia: Continue Aricept and  Depakote  Failure to thrive syndrome: Very frail-deconditioned-appears to have advanced dementia-Per RN-oral intake just about adequate.  Await PT/OT eval-SLP following for chronic dysphagia-upgraded to a dysphagia 2 diet.  Hard of hearing  ?  UTI: No symptoms-suspect asymptomatic bacteriuria-does not antimicrobial therapy at this point  ABG: No results found for: PHART, PCO2ART, PO2ART, HCO3, TCO2, ACIDBASEDEF, O2SAT  Vent Settings: N/A  Condition - Guarded  Family Communication  :  Daughter updated over the phone  Code Status :  DNR  Diet :  Diet Order            DIET DYS 2 Room service appropriate? Yes; Fluid consistency: Nectar Thick  Diet effective now               Disposition Plan  :  Remain hospitalized  Barriers to discharge: Complete 5 days of IV Remdesivir  Consults  :  None  Procedures  :  None  Antibiotics  :    Anti-infectives (From admission, onward)   Start     Dose/Rate Route Frequency Ordered Stop   10/23/19 1000  remdesivir 100 mg in sodium chloride 0.9 % 250 mL IVPB     100 mg 500 mL/hr over 30 Minutes Intravenous Every 24 hours 10/21/19 2134 10/25/19 0959   10/22/19 1600  remdesivir 100 mg in sodium chloride 0.9 % 250 mL IVPB      100 mg 500 mL/hr over 30 Minutes Intravenous  Once 10/21/19 2134 10/22/19 1748   10/22/19 1000  remdesivir 100 mg in sodium chloride 0.9 % 250 mL IVPB  Status:  Discontinued     100 mg 500 mL/hr over 30 Minutes Intravenous Every 24 hours 10/21/19 1830 10/21/19 2134   10/21/19 2200  remdesivir 100 mg in sodium chloride 0.9 % 250 mL IVPB  Status:  Discontinued     100 mg 500 mL/hr over 30 Minutes Intravenous Every 24 hours 10/20/19 1555 10/21/19 1830   10/21/19 1900  remdesivir 100 mg in sodium chloride 0.9 % 250 mL IVPB     100 mg 500 mL/hr over 30 Minutes Intravenous  Once 10/21/19 1830 10/22/19 0633   10/20/19 1630  remdesivir 200 mg in sodium chloride 0.9 % 250 mL IVPB     200 mg 500 mL/hr over 30 Minutes Intravenous Once 10/20/19 1555 10/20/19 1652   10/19/19 2200  cefTRIAXone (ROCEPHIN) 1 g in sodium chloride 0.9 % 100 mL IVPB  Status:  Discontinued     1 g 200 mL/hr over 30 Minutes Intravenous Every 24 hours 10/19/19 1657 10/22/19 0714   10/19/19 1800  azithromycin (ZITHROMAX) 500 mg in sodium chloride 0.9 % 250 mL IVPB  Status:  Discontinued     500 mg 250 mL/hr over 60 Minutes Intravenous Every 24 hours 10/19/19 1657 10/22/19 0714   10/19/19 1415  ceFEPIme (MAXIPIME) 2 g in sodium chloride 0.9 % 100 mL IVPB     2 g 200 mL/hr over 30 Minutes Intravenous  Once 10/19/19 1412 10/19/19 1544   10/19/19 1415  metroNIDAZOLE (FLAGYL) IVPB 500 mg     500 mg 100 mL/hr over 60 Minutes Intravenous  Once 10/19/19 1412 10/19/19 1707   10/19/19 1415  vancomycin (VANCOCIN) IVPB 1000 mg/200 mL premix  Status:  Discontinued     1,000 mg 200 mL/hr over 60 Minutes Intravenous  Once 10/19/19 1412 10/20/19 0236      Inpatient Medications  Scheduled Meds: . apixaban  5 mg Oral BID  . brimonidine  1 drop Both  Eyes TID  . carvedilol  3.125 mg Oral BID  . dexamethasone  6 mg Oral Daily  . divalproex  125 mg Oral BID  . donepezil  10 mg Oral QHS  . folic acid  1 mg Oral Daily  . guaiFENesin  15  mL Oral Q6H  . mirtazapine  15 mg Oral QHS  . multivitamin with minerals  1 tablet Oral Daily  . tamsulosin  0.4 mg Oral QHS  . vitamin C  500 mg Oral Daily  . zinc sulfate  220 mg Oral Daily   Continuous Infusions: . sodium chloride 10 mL/hr at 10/23/19 1411  . remdesivir 100 mg in NS 250 mL Stopped (10/23/19 1130)   PRN Meds:.acetaminophen **OR** acetaminophen, albuterol, nystatin, ondansetron **OR** ondansetron (ZOFRAN) IV, phenylephrine-shark liver oil-mineral oil-petrolatum, Resource ThickenUp Clear, Resource ThickenUp Clear, zinc oxide   Time Spent in minutes  25   See all Orders from today for further details   Oren Binet M.D on 10/23/2019 at 2:22 PM  To page go to [email or web address] - use universal password  [facility] -  Office  [phone]    Objective:   Vitals:   10/23/19 0448 10/23/19 0500 10/23/19 0600 10/23/19 0757  BP: 136/87   (!) 146/72  Pulse: 69 70 70 78  Resp:    16  Temp:  (!) 97 F (36.1 C)  97.6 F (36.4 C)  TempSrc:  Axillary  Axillary  SpO2: 100% 100% 100% 100%  Weight:      Height:        Wt Readings from Last 3 Encounters:  10/19/19 63.5 kg  09/01/19 63.1 kg  05/19/19 65.8 kg     Intake/Output Summary (Last 24 hours) at 10/23/2019 1422 Last data filed at 10/23/2019 1130 Gross per 24 hour  Intake 1997.83 ml  Output 500 ml  Net 1497.83 ml     Physical Exam Gen Exam: Pleasantly confused-lying comfortably in bed-not in any distress. HEENT:atraumatic, normocephalic Chest: B/L clear to auscultation anteriorly CVS:S1S2 regular Abdomen:soft non tender, non distended Extremities:no edema Neurology: Difficult exam-but seems to move all 4 extremities. Skin: no rash   Data Review:    CBC Recent Labs  Lab 10/19/19 1408 10/20/19 0346 10/21/19 0318 10/22/19 0030 10/23/19 0443  WBC 6.2 5.2 4.6 5.6 6.1  HGB 14.8 12.5* 12.9* 12.0* 13.5  HCT 46.6 39.9  38.1 40.8 37.9* 41.4  PLT 115* 82* 80* 104* 124*  MCV 103.8*  105.8* 104.6* 104.1* 101.2*  MCH 33.0 33.2 33.1 33.0 33.0  MCHC 31.8 31.3 31.6 31.7 32.6  RDW 12.8 13.0 12.8 12.8 12.5  LYMPHSABS 0.5*  --   --   --   --   MONOABS 0.4  --   --   --   --   EOSABS 0.2  --   --   --   --   BASOSABS 0.0  --   --   --   --     Chemistries  Recent Labs  Lab 10/19/19 1408 10/20/19 0346 10/21/19 0318 10/22/19 0030 10/23/19 0443  NA 141 143 142 145 141  K 4.1 4.3 4.0 4.6 4.0  CL 105 110 109 111 104  CO2 27 26 25 26 29   GLUCOSE 105* 105* 120* 128* 120*  BUN 19 18 17 20 20   CREATININE 0.72 0.63 0.61 0.63 0.56*  CALCIUM 9.6 9.0 9.3 9.4 9.4  AST 34 28 27 27 29   ALT 18 16 15 13 19   ALKPHOS 71 53 54 52  54  BILITOT 1.2 0.7 0.9 0.5 0.9   ------------------------------------------------------------------------------------------------------------------ No results for input(s): CHOL, HDL, LDLCALC, TRIG, CHOLHDL, LDLDIRECT in the last 72 hours.  No results found for: HGBA1C ------------------------------------------------------------------------------------------------------------------ No results for input(s): TSH, T4TOTAL, T3FREE, THYROIDAB in the last 72 hours.  Invalid input(s): FREET3 ------------------------------------------------------------------------------------------------------------------ Recent Labs    10/22/19 0030 10/23/19 0443  FERRITIN 516* 449*    Coagulation profile Recent Labs  Lab 10/19/19 1408  INR 1.2    Recent Labs    10/22/19 0030 10/23/19 0443  DDIMER 1.00* 1.35*    Cardiac Enzymes No results for input(s): CKMB, TROPONINI, MYOGLOBIN in the last 168 hours.  Invalid input(s): CK ------------------------------------------------------------------------------------------------------------------ No results found for: BNP  Micro Results Recent Results (from the past 240 hour(s))  Blood Culture (routine x 2)     Status: None (Preliminary result)   Collection Time: 10/19/19  2:09 PM   Specimen: Site Not  Specified; Blood  Result Value Ref Range Status   Specimen Description   Final    SITE NOT SPECIFIED Performed at [facility] [address], [place], [place] [address]    Special Requests   Final    BOTTLES DRAWN AEROBIC AND ANAEROBIC Blood Culture results may not be optimal due to an inadequate volume of blood received in culture bottles Performed at [facility] [address], [place], [place] [address]    Culture   Final    NO GROWTH 3 DAYS Performed at [facility], [place] [address] [place], [place] [address]    Report Status PENDING  Incomplete  Blood Culture (routine x 2)     Status: None (Preliminary result)   Collection Time: 10/19/19  2:09 PM   Specimen: Site Not Specified; Blood  Result Value Ref Range Status   Specimen Description   Final    SITE NOT SPECIFIED Performed at [facility] [address], [place], [place] [address]    Special Requests   Final    BOTTLES DRAWN AEROBIC AND ANAEROBIC Blood Culture results may not be optimal due to an inadequate volume of blood received in culture bottles Performed at [facility] [address], [place], [place] [address]    Culture   Final    NO GROWTH 3 DAYS Performed at [facility], [place] [address], [place], [place] [address]    Report Status PENDING  Incomplete  Urine culture     Status: Abnormal   Collection Time: 10/19/19  2:09 PM   Specimen: In/Out Cath Urine  Result Value Ref Range Status   Specimen Description   Final    IN/OUT CATH URINE Performed at [facility] [address], [place], [place] [address]    Special Requests   Final    NONE Performed at [facility], [place] [address] [place], [place] [address]    Culture 60,000 COLONIES/mL STAPHYLOCOCCUS EPIDERMIDIS (A)  Final   Report Status 10/21/2019 FINAL  Final   Organism ID, Bacteria STAPHYLOCOCCUS EPIDERMIDIS (A)  Final       Susceptibility   Staphylococcus epidermidis - MIC*    CIPROFLOXACIN <=0.5 SENSITIVE Sensitive     GENTAMICIN <=0.5 SENSITIVE Sensitive     NITROFURANTOIN <=16 SENSITIVE Sensitive     OXACILLIN RESISTANT Resistant     TETRACYCLINE 2 SENSITIVE Sensitive     VANCOMYCIN 1 SENSITIVE Sensitive     TRIMETH/SULFA <=10 SENSITIVE Sensitive     CLINDAMYCIN <=0.25  SENSITIVE Sensitive     RIFAMPIN <=0.5 SENSITIVE Sensitive     Inducible Clindamycin NEGATIVE Sensitive     * 60,000 COLONIES/mL STAPHYLOCOCCUS EPIDERMIDIS  SARS CORONAVIRUS 2 (TAT 6-24 HRS) Nasopharyngeal Nasopharyngeal Swab     Status: Abnormal   Collection Time: 10/19/19  2:09 PM   Specimen: Nasopharyngeal Swab  Result Value Ref Range Status   SARS Coronavirus 2 POSITIVE (A) NEGATIVE Final    Comment: RESULT CALLED TO, READ BACK BY AND VERIFIED WITH: Gray Bernhardt, RN AT 2206 ON 10/19/2019 BY SAINVILUS S (NOTE) SARS-CoV-2 target nucleic acids are DETECTED. The SARS-CoV-2 RNA is generally detectable in upper and lower respiratory specimens during the acute phase of infection. Positive results are indicative of active infection with SARS-CoV-2. Clinical  correlation with patient history and other diagnostic information is necessary to determine patient infection status. Positive results do  not rule out bacterial infection or co-infection with other viruses. The expected result is Negative. Fact Sheet for Patients: SugarRoll.be Fact Sheet for Healthcare Providers: https://www.woods-mathews.com/ This test is not yet approved or cleared by the [place] FDA and  has been authorized for detection and/or diagnosis of SARS-CoV-2 by FDA under an Emergency Use Authorization (EUA). This EUA will remain  in effect (meaning this test can b e used) for the duration of the COVID-19 declaration under Section 564(b)(1) of the Act, 21 U.S.C. section 360bbb-3(b)(1), unless the authorization is  terminated or revoked sooner. Performed at [facility], [place] [address], [place], [place] [address]   MRSA PCR Screening     Status: None   Collection Time: 10/19/19  5:53 PM   Specimen: Nasal Mucosa; Nasopharyngeal  Result Value Ref Range Status   MRSA by PCR NEGATIVE NEGATIVE Final    Comment:        The GeneXpert MRSA Assay (FDA approved for NASAL specimens only), is one component of a comprehensive MRSA colonization surveillance program. It is not intended to diagnose MRSA infection nor to guide or monitor treatment for MRSA infections. Performed at [facility], [place] [address] [place], [place] [address]   Respiratory Panel by PCR     Status: None   Collection Time: 10/20/19  3:13 PM   Specimen: Nasopharyngeal Swab; Respiratory  Result Value Ref Range Status   Adenovirus NOT DETECTED NOT DETECTED Final   Coronavirus 229E NOT DETECTED NOT DETECTED Final    Comment: (NOTE) The Coronavirus on the Respiratory Panel, DOES NOT test for the novel  Coronavirus (2019 nCoV)    Coronavirus HKU1 NOT DETECTED NOT DETECTED Final   Coronavirus NL63 NOT DETECTED NOT DETECTED Final   Coronavirus OC43 NOT DETECTED NOT DETECTED Final   Metapneumovirus NOT DETECTED NOT DETECTED Final   Rhinovirus / Enterovirus NOT DETECTED NOT DETECTED Final   Influenza A NOT DETECTED NOT DETECTED Final   Influenza B NOT DETECTED NOT DETECTED Final   Parainfluenza Virus 1 NOT DETECTED NOT DETECTED Final   Parainfluenza Virus 2 NOT DETECTED NOT DETECTED Final   Parainfluenza Virus 3 NOT DETECTED NOT DETECTED Final   Parainfluenza Virus 4 NOT DETECTED NOT DETECTED Final   Respiratory Syncytial Virus NOT DETECTED NOT DETECTED Final   Bordetella pertussis NOT DETECTED NOT DETECTED Final   Chlamydophila pneumoniae NOT DETECTED NOT DETECTED Final   Mycoplasma pneumoniae NOT DETECTED NOT DETECTED Final    Comment: Performed at [facility], [place]. [address], [place], [place]  [address]    Radiology Reports Dg Chest [facility] [address]  Result Date: 10/19/2019 CLINICAL DATA:  Cough, altered mental status EXAM: PORTABLE CHEST 1 VIEW COMPARISON:  01/12/2019 FINDINGS: Left-sided implanted cardiac device with new generator. Leads in unchanged positioning. Median sternotomy with cardiac valve replacement. Stable cardiomegaly. Previously seen right upper lobe and right lower lobe airspace opacities have resolved. There is a hazy left lower lobe airspace opacity, new from prior. No pleural effusion or pneumothorax. IMPRESSION: 1. New hazy airspace opacity in the left lower lobe suspicious for pneumonia. 2. Resolved right upper lobe and right lower lobe airspace opacities. Electronically Signed   By: Davina Poke M.D.   On: 10/19/2019 15:03

## 2019-10-23 NOTE — Plan of Care (Signed)
Patient Summary: No adverse events throughout the night. No SOB or acute distress noted. No indications of pain noted. All lines remain intact. Patient placed on 2L [place] for sats <92% while asleep. Easily aroused. O2 sat 100% on 2L [place]. Bed in low locked position. Plan of care continued.  Problem: Education: Goal: Knowledge of General Education information will improve Description: Including pain rating scale, medication(s)/side effects and non-pharmacologic comfort measures Outcome: Progressing   Problem: Health Behavior/Discharge Planning: Goal: Ability to manage health-related needs will improve Outcome: Progressing   Problem: Clinical Measurements: Goal: Ability to maintain clinical measurements within normal limits will improve Outcome: Progressing Goal: Will remain free from infection Outcome: Progressing Goal: Diagnostic test results will improve Outcome: Progressing Goal: Respiratory complications will improve Outcome: Progressing Goal: Cardiovascular complication will be avoided Outcome: Progressing   Problem: Activity: Goal: Risk for activity intolerance will decrease Outcome: Progressing   Problem: Nutrition: Goal: Adequate nutrition will be maintained Outcome: Progressing   Problem: Coping: Goal: Level of anxiety will decrease Outcome: Progressing   Problem: Elimination: Goal: Will not experience complications related to bowel motility Outcome: Progressing Goal: Will not experience complications related to urinary retention Outcome: Progressing   Problem: Pain Managment: Goal: General experience of comfort will improve Outcome: Progressing   Problem: Safety: Goal: Ability to remain free from injury will improve Outcome: Progressing   Problem: Skin Integrity: Goal: Risk for impaired skin integrity will decrease Outcome: Progressing

## 2019-10-23 NOTE — TOC Initial Note (Addendum)
Transition of Care [facility]) - Initial/Assessment Note    Patient Details  Name: Grant Lawrence MRN: [id number] Date of Birth: Oct 29, 1931  [facility] CM/SW Contact:    Eileen Stanford, LCSW Phone Number: 10/23/2019, 3:39 PM  Clinical Narrative:   Pt is not alert and oriented. CSW spoke with pt's daughter via telephone. Pt's daughter states she would rather her father go back to [facility] and not SNF. CSW will reach out to ALF to determine if they are able to accommodate pt. Pt will not be able to return until 14 days following positive COVID test.               Expected Discharge Plan: Assisted Living     Patient Goals and CMS Choice Patient states their goals for this hospitalization and ongoing recovery are:: "for dad to return to ALF"   Choice offered to / list presented to : Adult Children  Expected Discharge Plan and Services Expected Discharge Plan: Assisted Living In-house Referral: Clinical Social Work     Living arrangements for the past 2 months: [place]                                      Prior Living Arrangements/Services Living arrangements for the past 2 months: [place] Lives with:: Self Patient language and need for interpreter reviewed:: Yes Do you feel safe going back to the place where you live?: Yes      Need for Family Participation in Patient Care: Yes (Comment) Care giver support system in place?: Yes (comment)   Criminal Activity/Legal Involvement Pertinent to Current Situation/Hospitalization: No - Comment as needed  Activities of Daily Living Home Assistive Devices/Equipment: Blood pressure cuff, Grab bars around toilet, Grab bars in shower, Hand-held shower hose, Hospital bed, Scales, Wheelchair([facility] has necessary equipment for their residents) ADL Screening (condition at time of admission) Patient's cognitive ability adequate to safely complete daily activities?: No Is the patient deaf  or have difficulty hearing?: No Does the patient have difficulty seeing, even when wearing glasses/contacts?: Yes Does the patient have difficulty concentrating, remembering, or making decisions?: Yes Patient able to express need for assistance with ADLs?: No Does the patient have difficulty dressing or bathing?: Yes Independently performs ADLs?: No Communication: Needs assistance(patient is nonverbal) Is this a change from baseline?: Pre-admission baseline Dressing (OT): Dependent Is this a change from baseline?: Pre-admission baseline Grooming: Dependent Is this a change from baseline?: Pre-admission baseline Feeding: Dependent Is this a change from baseline?: Pre-admission baseline Bathing: Dependent Is this a change from baseline?: Pre-admission baseline Toileting: Dependent Is this a change from baseline?: Pre-admission baseline In/Out Bed: Dependent Is this a change from baseline?: Pre-admission baseline Walks in Home: Dependent Is this a change from baseline?: Pre-admission baseline Does the patient have difficulty walking or climbing stairs?: Yes(secondary to weakness) Weakness of Legs: Both Weakness of Arms/Hands: None  Permission Sought/Granted   Permission granted to share information with : Yes, Release of Information Signed  Share Information with NAME: [name]  Permission granted to share info w AGENCY: [facility]  Permission granted to share info w Relationship: daughter     Emotional Assessment Appearance:: Appears stated age Attitude/Demeanor/Rapport: Unable to Assess Affect (typically observed): Unable to Assess Orientation: : Oriented to Self Alcohol / Substance Use: Not Applicable Psych Involvement: No (comment)  Admission diagnosis:  PNA (pneumonia) [J18.9] Patient Active Problem List  Diagnosis Date Noted  . PNA (pneumonia) 10/19/2019  . Pacemaker battery depletion 05/19/2019  . ICD (implantable cardioverter-defibrillator) battery depletion   . FTT  (failure to thrive) in adult   . Community acquired pneumonia of both lower lobes 01/13/2019  . Nausea and vomiting 01/13/2019  . Acute respiratory failure with hypoxia ([place]) 01/13/2019  . Recurrent aspiration pneumonia ([place]) 08/28/2018  . Coronary artery disease involving coronary bypass graft of native heart without angina pectoris 03/06/2018  . Hypercholesterolemia 03/06/2018  . Dementia ([place]) 09/09/2017  . BPH (benign prostatic hyperplasia) 07/22/2016  . Elevated PSA 07/22/2016  . PVCs (premature ventricular contractions) 07/03/2016  . History of repair of mitral valve 07/03/2016  . CAD (coronary artery disease)s/p CABG 2005 08/13/2014  . Mitral valve insufficiency s/p annuloplasty 2005 08/13/2014  . s/p CRT-D St Jude 2010,gen change Sept 2015 08/13/2014  . Elective replacement indicated for implantable cardioverter-defibrillator (ICD) reached July 03, 2014 08/13/2014  . HTN (hypertension) 08/13/2014  . Mixed hyperlipidemia 08/13/2014  . Aortic insufficiency 08/13/2014  . Tricuspid insufficiency 08/13/2014  . Statin intolerance 08/13/2014  . CHB (complete heart block) s/p AV node ablation 08/13/2014  . Chronic diastolic heart failure ([place]) 08/13/2014  . A-fib ([place]) 08/12/2014   PCP:  [facility]:   [facility], [place] [address] Phone: [phone] Fax: [phone]     Social Determinants of Health (SDOH) Interventions    Readmission Risk Interventions No flowsheet data found.

## 2019-10-24 LAB — CBC
HCT: 42.3 % (ref 39.0–52.0)
Hemoglobin: 13.9 g/dL (ref 13.0–17.0)
MCH: 32.8 pg (ref 26.0–34.0)
MCHC: 32.9 g/dL (ref 30.0–36.0)
MCV: 99.8 fL (ref 80.0–100.0)
Platelets: 150 10*3/uL (ref 150–400)
RBC: 4.24 MIL/uL (ref 4.22–5.81)
RDW: 12.3 % (ref 11.5–15.5)
WBC: 6.8 10*3/uL (ref 4.0–10.5)
nRBC: 0 % (ref 0.0–0.2)

## 2019-10-24 LAB — D-DIMER, QUANTITATIVE: D-Dimer, Quant: 1.87 ug/mL-FEU — ABNORMAL HIGH (ref 0.00–0.50)

## 2019-10-24 LAB — CULTURE, BLOOD (ROUTINE X 2)
Culture: NO GROWTH
Culture: NO GROWTH

## 2019-10-24 LAB — COMPREHENSIVE METABOLIC PANEL
ALT: 24 U/L (ref 0–44)
AST: 38 U/L (ref 15–41)
Albumin: 2.7 g/dL — ABNORMAL LOW (ref 3.5–5.0)
Alkaline Phosphatase: 61 U/L (ref 38–126)
Anion gap: 8 (ref 5–15)
BUN: 21 mg/dL (ref 8–23)
CO2: 28 mmol/L (ref 22–32)
Calcium: 9.7 mg/dL (ref 8.9–10.3)
Chloride: 103 mmol/L (ref 98–111)
Creatinine, Ser: 0.55 mg/dL — ABNORMAL LOW (ref 0.61–1.24)
GFR calc Af Amer: >60 mL/min (ref >60)
GFR calc non Af Amer: >60 mL/min (ref >60)
Glucose, Bld: 103 mg/dL — ABNORMAL HIGH (ref 70–99)
Potassium: 4.7 mmol/L (ref 3.5–5.1)
Sodium: 139 mmol/L (ref 135–145)
Total Bilirubin: 0.7 mg/dL (ref 0.3–1.2)
Total Protein: 5.8 g/dL — ABNORMAL LOW (ref 6.5–8.1)

## 2019-10-24 LAB — FERRITIN: Ferritin: 419 ng/mL — ABNORMAL HIGH (ref 24–336)

## 2019-10-24 LAB — C-REACTIVE PROTEIN: CRP: 3.5 mg/dL — ABNORMAL HIGH (ref <1.0)

## 2019-10-24 NOTE — Evaluation (Signed)
Occupational Therapy Evaluation Patient Details Name: Grant Lawrence MRN: [id number] DOB: 01-20-1931 Today's Date: 10/24/2019    History of Present Illness 85 y/o m w/ hx HTN, HLD, Dementia, CAD, CHF, cardiac pacemake, cardiac defibrilator asymptomatic microscopic hematuria, angina ppectoris, a-fib, mitral valve repair/ CABG, dx COVID+ 11/10   Clinical Impression   This 85 y/o male presents with the above. PTA pt residing in ALF, per chart pt ambulatory with RW and receiving assist for ADL. Pt very lethargic this session, opens eyes and responding only minimally to name. Pt often resistive to movements/therapist's attempts to engage pt in functional ADL, requiring totalA for ADL and repositioning at bed level (egressed bed to modified chair position). VSS throughout on RA. Noted per chart family with wishes for pt to return to his prior ALF. Will continue to follow acutely to further assess/progress pt's ADL and mobility status. Recommend pt have 24hr hands on supervision/assist at time of discharge - pending availability of ALF to provide this assist pt may need [facility] SNF initially. Will follow.     Follow Up Recommendations  SNF;Supervision/Assistance - 24 hour    Equipment Recommendations  Other (comment)(defer to next venue)           Precautions / Restrictions Precautions Precautions: Fall;ICD/Pacemaker Precaution Comments: cognition. has been combative Required Braces or Orthoses: Other Brace(has R wrist brace ) Restrictions Weight Bearing Restrictions: No      Mobility Bed Mobility Overal bed mobility: Needs Assistance             General bed mobility comments: totalA for repositioning, pt resistive to moving this AM  Transfers                 General transfer comment: deferred     Balance                                           ADL either performed or assessed with clinical judgement   ADL Overall ADL's : Needs assistance/impaired                                        General ADL Comments: pt totalA at this time, attempted simple grooming ADL and pt resisting hand over hand assist to initiate task performance; pt too lethargic to safely attempt self feeding                         Pertinent Vitals/Pain Pain Assessment: Faces Faces Pain Scale: No hurt Pain Intervention(s): Monitored during session     Hand Dominance     Extremity/Trunk Assessment Upper Extremity Assessment Upper Extremity Assessment: Generalized weakness;RUE deficits/detail;Difficult to assess due to impaired cognition RUE Deficits / Details: note pt's R wrist in soft wrist splint  RUE Coordination: decreased fine motor   Lower Extremity Assessment Lower Extremity Assessment: Defer to PT evaluation       Communication Communication Communication: Receptive difficulties;Expressive difficulties   Cognition Arousal/Alertness: Lethargic Behavior During Therapy: Flat affect Overall Cognitive Status: History of cognitive impairments - at baseline                                 General Comments: pt very lethargic this session, resistive  to some movements   General Comments       Exercises     Shoulder Instructions      Home Living Family/patient expects to be discharged to:: Assisted living                             Home Equipment: (unable to give details)   Additional Comments: per chart pt from [facility]      Prior Functioning/Environment Level of Independence: Needs assistance  Gait / Transfers Assistance Needed: Amb with rolling walker ADL's / Homemaking Assistance Needed: needs a with ADLs            OT Problem List: Decreased strength;Decreased range of motion;Decreased activity tolerance;Impaired balance (sitting and/or standing);Decreased cognition;Decreased safety awareness;Decreased knowledge of use of DME or AE;Pain;Impaired UE functional use;Cardiopulmonary  status limiting activity;Decreased knowledge of precautions      OT Treatment/Interventions: Self-care/ADL training;Therapeutic exercise;Energy conservation;DME and/or AE instruction;Therapeutic activities;Cognitive remediation/compensation;Visual/perceptual remediation/compensation;Patient/family education;Balance training    OT Goals(Current goals can be found in the care plan section) Acute Rehab OT Goals Patient Stated Goal: unable to verbalize goals, per chart family with for pt to return to his ALF OT Goal Formulation: Patient unable to participate in goal setting Time For Goal Achievement: 11/07/19 Potential to Achieve Goals: Fair  OT Frequency: Min 2X/week   Barriers to D/C:            Co-evaluation              AM-PAC OT "6 Clicks" Daily Activity     Outcome Measure Help from another person eating meals?: Total Help from another person taking care of personal grooming?: Total Help from another person toileting, which includes using toliet, bedpan, or urinal?: Total Help from another person bathing (including washing, rinsing, drying)?: Total Help from another person to put on and taking off regular upper body clothing?: Total Help from another person to put on and taking off regular lower body clothing?: Total 6 Click Score: 6   End of Session Nurse Communication: Mobility status  Activity Tolerance: Patient limited by lethargy Patient left: in bed;with call bell/phone within reach  OT Visit Diagnosis: Muscle weakness (generalized) (M62.81);Other symptoms and signs involving cognitive function                Time: 0921-0931 OT Time Calculation (min): 10 min Charges:  OT General Charges $OT Visit: 1 Visit OT Evaluation $OT Eval Moderate Complexity: 1 Mod  Grant Lawrence, OT [employer] Pager [phone] Office [phone]  Grant Lawrence 10/24/2019, 1:12 PM

## 2019-10-24 NOTE — Plan of Care (Signed)
Patient Summary: No SOB or acute distress noted. Patient sat >98% on RA. No indications of pain noted. Patient appears to be resting comfortably. See MAR re: medications administered. See flowsheet re: assessment. Plan of care continued. Bed in low locked position with call light in reach. Problem: Education: Goal: Knowledge of General Education information will improve Description: Including pain rating scale, medication(s)/side effects and non-pharmacologic comfort measures Outcome: Progressing   Problem: Health Behavior/Discharge Planning: Goal: Ability to manage health-related needs will improve Outcome: Progressing   Problem: Clinical Measurements: Goal: Ability to maintain clinical measurements within normal limits will improve Outcome: Progressing Goal: Will remain free from infection Outcome: Progressing Goal: Diagnostic test results will improve Outcome: Progressing Goal: Respiratory complications will improve Outcome: Progressing Goal: Cardiovascular complication will be avoided Outcome: Progressing   Problem: Activity: Goal: Risk for activity intolerance will decrease Outcome: Progressing   Problem: Nutrition: Goal: Adequate nutrition will be maintained Outcome: Progressing   Problem: Coping: Goal: Level of anxiety will decrease Outcome: Progressing   Problem: Elimination: Goal: Will not experience complications related to bowel motility Outcome: Progressing Goal: Will not experience complications related to urinary retention Outcome: Progressing   Problem: Pain Managment: Goal: General experience of comfort will improve Outcome: Progressing   Problem: Safety: Goal: Ability to remain free from injury will improve Outcome: Progressing   Problem: Skin Integrity: Goal: Risk for impaired skin integrity will decrease Outcome: Progressing

## 2019-10-24 NOTE — Progress Notes (Addendum)
PROGRESS NOTE                                                                                                                                                                                                             Patient Demographics:    Grant Lawrence, is a 85 y.o. male, DOB - [birth year], [id number]  Outpatient Primary MD for the patient is [facility]   Admit date - 10/19/2019   LOS - 5  Chief Complaint  Patient presents with  . Dysuria       Brief Narrative: Patient is a 85 y.o. male with PMHx of dementia, chronic diastolic heart failure, complete heart block-s/p permanent pacemaker implantation, permanent atrial fibrillation, HTN, s/p mitral valve annuloplasty-transferred from SNF on 11/10 for evaluation of fever, confusion-further evaluation revealed COVID-19 pneumonia.  See below for further details.   Subjective:    Grant Lawrence today remains comfortable-no major issues overnight.  He is still very pleasantly confused.     Assessment  & Plan :   Acute Hypoxic Resp Failure due to Covid 19 Viral pneumonia: Stable-slowly improving-inflammatory markers are downtrending.  Remains on room air-continue steroids and remdesivir.    Fever: afebrile  O2 requirements: On RA  COVID-19 Labs: Recent Labs    10/22/19 0030 10/23/19 0443 10/24/19 0255  DDIMER 1.00* 1.35* 1.87*  FERRITIN 516* 449* 419*  CRP 10.7* 6.2* 3.5*    Lab Results  Component Value Date   SARSCOV2NAA POSITIVE (A) 10/19/2019     COVID-19 Medications: Steroids: 11/11>> Remdesivir: 11/11>> Actemra: Not given-as patient not significantly hypoxic. Convalescent Plasma: Not given Research Studies:N/A  Other medications: Diuretics:Euvolemic-no need for lasix Antibiotics: No evidence of bacterial pneumonia-stop Rocephin/Zithromax on 11/13.  Prone/Incentive Spirometry: Limited due to dementia  DVT Prophylaxis  :  Eliquis  Acute metabolic encephalopathy superimposed on dementia:  encephalopathy likely secondary to COVID-19 pneumonia-superimposed on dementia with delirium.  Suspect encephalopathy has improved-do not think he is that far from his usual baseline.  CAD s/p CABG August 2005 and mitral valve annuloplasty for severe MR: No anginal symptoms-continue supportive care  Permanent atrial fibrillation s/p AV node ablation October 2010-complicated by complete heart block-requiring permanent pacemaker implantation: Rate controlled with Coreg-anticoagulated with Eliquis.    Macrocytosis/thrombocytopenia: Appears to be chronic-we will need outpatient work-up-but suspect further work-up would not change outcome or management  given advanced dementia/frailty.  BPH: Continue Flomax  Dementia: Continue Aricept and Depakote  Failure to thrive syndrome: Very frail-deconditioned-appears to have advanced dementia-seems to be tolerating dysphagia 2 diet.  Appreciate PT/OT eval.    Hard of hearing  ?  UTI: No symptoms-suspect asymptomatic bacteriuria-does not need antimicrobial therapy at this point  ABG: No results found for: PHART, PCO2ART, PO2ART, HCO3, TCO2, ACIDBASEDEF, O2SAT  Vent Settings: N/A  Condition - Guarded  Family Communication  :  Daughter updated over the phone on 11/15  Code Status :  DNR  Diet :  Diet Order            DIET DYS 2 Room service appropriate? Yes; Fluid consistency: Nectar Thick  Diet effective now               Disposition Plan  :  Remain hospitalized  Barriers to discharge: Complete 5 days of IV Remdesivir  Consults  :  None  Procedures  :  None  Antibiotics  :    Anti-infectives (From admission, onward)   Start     Dose/Rate Route Frequency Ordered Stop   10/23/19 1000  remdesivir 100 mg in sodium chloride 0.9 % 250 mL IVPB     100 mg 500 mL/hr over 30 Minutes Intravenous Every 24 hours 10/21/19 2134 10/24/19 1129   10/22/19 1600  remdesivir 100 mg  in sodium chloride 0.9 % 250 mL IVPB     100 mg 500 mL/hr over 30 Minutes Intravenous  Once 10/21/19 2134 10/22/19 1748   10/22/19 1000  remdesivir 100 mg in sodium chloride 0.9 % 250 mL IVPB  Status:  Discontinued     100 mg 500 mL/hr over 30 Minutes Intravenous Every 24 hours 10/21/19 1830 10/21/19 2134   10/21/19 2200  remdesivir 100 mg in sodium chloride 0.9 % 250 mL IVPB  Status:  Discontinued     100 mg 500 mL/hr over 30 Minutes Intravenous Every 24 hours 10/20/19 1555 10/21/19 1830   10/21/19 1900  remdesivir 100 mg in sodium chloride 0.9 % 250 mL IVPB     100 mg 500 mL/hr over 30 Minutes Intravenous  Once 10/21/19 1830 10/22/19 0633   10/20/19 1630  remdesivir 200 mg in sodium chloride 0.9 % 250 mL IVPB     200 mg 500 mL/hr over 30 Minutes Intravenous Once 10/20/19 1555 10/20/19 1652   10/19/19 2200  cefTRIAXone (ROCEPHIN) 1 g in sodium chloride 0.9 % 100 mL IVPB  Status:  Discontinued     1 g 200 mL/hr over 30 Minutes Intravenous Every 24 hours 10/19/19 1657 10/22/19 0714   10/19/19 1800  azithromycin (ZITHROMAX) 500 mg in sodium chloride 0.9 % 250 mL IVPB  Status:  Discontinued     500 mg 250 mL/hr over 60 Minutes Intravenous Every 24 hours 10/19/19 1657 10/22/19 0714   10/19/19 1415  ceFEPIme (MAXIPIME) 2 g in sodium chloride 0.9 % 100 mL IVPB     2 g 200 mL/hr over 30 Minutes Intravenous  Once 10/19/19 1412 10/19/19 1544   10/19/19 1415  metroNIDAZOLE (FLAGYL) IVPB 500 mg     500 mg 100 mL/hr over 60 Minutes Intravenous  Once 10/19/19 1412 10/19/19 1707   10/19/19 1415  vancomycin (VANCOCIN) IVPB 1000 mg/200 mL premix  Status:  Discontinued     1,000 mg 200 mL/hr over 60 Minutes Intravenous  Once 10/19/19 1412 10/20/19 0236      Inpatient Medications  Scheduled Meds: . apixaban  5 mg  Oral BID  . brimonidine  1 drop Both Eyes TID  . carvedilol  3.125 mg Oral BID  . dexamethasone  6 mg Oral Daily  . divalproex  125 mg Oral BID  . donepezil  10 mg Oral QHS  . folic  acid  1 mg Oral Daily  . guaiFENesin  15 mL Oral Q6H  . mirtazapine  15 mg Oral QHS  . multivitamin with minerals  1 tablet Oral Daily  . tamsulosin  0.4 mg Oral QHS  . vitamin C  500 mg Oral Daily  . zinc sulfate  220 mg Oral Daily   Continuous Infusions: . sodium chloride 10 mL/hr at 10/24/19 0400   PRN Meds:.acetaminophen **OR** acetaminophen, albuterol, nystatin, ondansetron **OR** ondansetron (ZOFRAN) IV, phenylephrine-shark liver oil-mineral oil-petrolatum, Resource ThickenUp Clear, Resource ThickenUp Clear, zinc oxide   Time Spent in minutes  25   See all Orders from today for further details   Oren Binet M.D on 10/24/2019 at 11:34 AM  To page go to [email or web address] - use universal password  [facility] -  Office  [phone]    Objective:   Vitals:   10/24/19 0400 10/24/19 0500 10/24/19 0504 10/24/19 0740  BP:   135/84 (!) 147/83  Pulse: 73 73 74 70  Resp:    18  Temp:   97.6 F (36.4 C) (!) 97.1 F (36.2 C)  TempSrc:   Axillary Axillary  SpO2: 100% 100% 100% 100%  Weight:      Height:        Wt Readings from Last 3 Encounters:  10/19/19 63.5 kg  09/01/19 63.1 kg  05/19/19 65.8 kg     Intake/Output Summary (Last 24 hours) at 10/24/2019 1134 Last data filed at 10/24/2019 0504 Gross per 24 hour  Intake 200 ml  Output 700 ml  Net -500 ml     Physical Exam Gen Exam pleasantly confused-lying comfortably in bed-not in any distress HEENT:atraumatic, normocephalic Chest: B/L clear to auscultation anteriorly CVS:S1S2 regular Abdomen:soft non tender, non distended Extremities:no edema Neurology: Moving all 4 extremities  skin: no rash   Data Review:    CBC Recent Labs  Lab 10/19/19 1408 10/20/19 0346 10/21/19 0318 10/22/19 0030 10/23/19 0443 10/24/19 0255  WBC 6.2 5.2 4.6 5.6 6.1 6.8  HGB 14.8 12.5* 12.9* 12.0* 13.5 13.9  HCT 46.6 39.9  38.1 40.8 37.9* 41.4 42.3  PLT 115* 82* 80* 104* 124* 150  MCV 103.8* 105.8* 104.6*  104.1* 101.2* 99.8  MCH 33.0 33.2 33.1 33.0 33.0 32.8  MCHC 31.8 31.3 31.6 31.7 32.6 32.9  RDW 12.8 13.0 12.8 12.8 12.5 12.3  LYMPHSABS 0.5*  --   --   --   --   --   MONOABS 0.4  --   --   --   --   --   EOSABS 0.2  --   --   --   --   --   BASOSABS 0.0  --   --   --   --   --     Chemistries  Recent Labs  Lab 10/20/19 0346 10/21/19 0318 10/22/19 0030 10/23/19 0443 10/24/19 0255  NA 143 142 145 141 139  K 4.3 4.0 4.6 4.0 4.7  CL 110 109 111 104 103  CO2 26 25 26 29 28   GLUCOSE 105* 120* 128* 120* 103*  BUN 18 17 20 20 21   CREATININE 0.63 0.61 0.63 0.56* 0.55*  CALCIUM 9.0 9.3 9.4 9.4 9.7  AST 28 27  27 29 38  ALT 16 15 13 19 24   ALKPHOS 53 54 52 54 61  BILITOT 0.7 0.9 0.5 0.9 0.7   ------------------------------------------------------------------------------------------------------------------ No results for input(s): CHOL, HDL, LDLCALC, TRIG, CHOLHDL, LDLDIRECT in the last 72 hours.  No results found for: HGBA1C ------------------------------------------------------------------------------------------------------------------ No results for input(s): TSH, T4TOTAL, T3FREE, THYROIDAB in the last 72 hours.  Invalid input(s): FREET3 ------------------------------------------------------------------------------------------------------------------ Recent Labs    10/23/19 0443 10/24/19 0255  FERRITIN 449* 419*    Coagulation profile Recent Labs  Lab 10/19/19 1408  INR 1.2    Recent Labs    10/23/19 0443 10/24/19 0255  DDIMER 1.35* 1.87*    Cardiac Enzymes No results for input(s): CKMB, TROPONINI, MYOGLOBIN in the last 168 hours.  Invalid input(s): CK ------------------------------------------------------------------------------------------------------------------ No results found for: BNP  Micro Results Recent Results (from the past 240 hour(s))  Blood Culture (routine x 2)     Status: None (Preliminary result)   Collection Time: 10/19/19  2:09 PM    Specimen: Site Not Specified; Blood  Result Value Ref Range Status   Specimen Description   Final    SITE NOT SPECIFIED Performed at [facility] [address], [place], [place] [address]    Special Requests   Final    BOTTLES DRAWN AEROBIC AND ANAEROBIC Blood Culture results may not be optimal due to an inadequate volume of blood received in culture bottles Performed at [facility] [address] [place], [place] [address]    Culture   Final    NO GROWTH 4 DAYS Performed at [facility], [place] [address] [place], [place] [address]    Report Status PENDING  Incomplete  Blood Culture (routine x 2)     Status: None (Preliminary result)   Collection Time: 10/19/19  2:09 PM   Specimen: Site Not Specified; Blood  Result Value Ref Range Status   Specimen Description   Final    SITE NOT SPECIFIED Performed at [facility] [address], [place], [place] [address]    Special Requests   Final    BOTTLES DRAWN AEROBIC AND ANAEROBIC Blood Culture results may not be optimal due to an inadequate volume of blood received in culture bottles Performed at [facility] [address], [place], [place] [address]    Culture   Final    NO GROWTH 4 DAYS Performed at [facility], [place] [address], [place], [place] [address]    Report Status PENDING  Incomplete  Urine culture     Status: Abnormal   Collection Time: 10/19/19  2:09 PM   Specimen: In/Out Cath Urine  Result Value Ref Range Status   Specimen Description   Final    IN/OUT CATH URINE Performed at [facility] [address], [place], [place] [address]    Special Requests   Final    NONE Performed at [facility], [place] [address], [place], [place] [address]    Culture 60,000 COLONIES/mL STAPHYLOCOCCUS EPIDERMIDIS (A)  Final   Report Status 10/21/2019 FINAL  Final   Organism ID, Bacteria STAPHYLOCOCCUS  EPIDERMIDIS (A)  Final      Susceptibility   Staphylococcus epidermidis - MIC*    CIPROFLOXACIN <=0.5 SENSITIVE Sensitive     GENTAMICIN <=0.5 SENSITIVE Sensitive     NITROFURANTOIN <=16 SENSITIVE Sensitive     OXACILLIN RESISTANT Resistant     TETRACYCLINE 2 SENSITIVE Sensitive     VANCOMYCIN 1  SENSITIVE Sensitive     TRIMETH/SULFA <=10 SENSITIVE Sensitive     CLINDAMYCIN <=0.25 SENSITIVE Sensitive     RIFAMPIN <=0.5 SENSITIVE Sensitive     Inducible Clindamycin NEGATIVE Sensitive     * 60,000 COLONIES/mL STAPHYLOCOCCUS EPIDERMIDIS  SARS CORONAVIRUS 2 (TAT 6-24 HRS) Nasopharyngeal Nasopharyngeal Swab     Status: Abnormal   Collection Time: 10/19/19  2:09 PM   Specimen: Nasopharyngeal Swab  Result Value Ref Range Status   SARS Coronavirus 2 POSITIVE (A) NEGATIVE Final    Comment: RESULT CALLED TO, READ BACK BY AND VERIFIED WITH: Gray Bernhardt, RN AT 2206 ON 10/19/2019 BY SAINVILUS S (NOTE) SARS-CoV-2 target nucleic acids are DETECTED. The SARS-CoV-2 RNA is generally detectable in upper and lower respiratory specimens during the acute phase of infection. Positive results are indicative of active infection with SARS-CoV-2. Clinical  correlation with patient history and other diagnostic information is necessary to determine patient infection status. Positive results do  not rule out bacterial infection or co-infection with other viruses. The expected result is Negative. Fact Sheet for Patients: SugarRoll.be Fact Sheet for Healthcare Providers: https://www.woods-mathews.com/ This test is not yet approved or cleared by the [place] FDA and  has been authorized for detection and/or diagnosis of SARS-CoV-2 by FDA under an Emergency Use Authorization (EUA). This EUA will remain  in effect (meaning this test can b e used) for the duration of the COVID-19 declaration under Section 564(b)(1) of the Act, 21 U.S.C. section 360bbb-3(b)(1), unless the  authorization is terminated or revoked sooner. Performed at [facility], [place] [address], [place], [place] [address]   MRSA PCR Screening     Status: None   Collection Time: 10/19/19  5:53 PM   Specimen: Nasal Mucosa; Nasopharyngeal  Result Value Ref Range Status   MRSA by PCR NEGATIVE NEGATIVE Final    Comment:        The GeneXpert MRSA Assay (FDA approved for NASAL specimens only), is one component of a comprehensive MRSA colonization surveillance program. It is not intended to diagnose MRSA infection nor to guide or monitor treatment for MRSA infections. Performed at [facility], [place] [address] [place],  [address]   Respiratory Panel by PCR     Status: None   Collection Time: 10/20/19  3:13 PM   Specimen: Nasopharyngeal Swab; Respiratory  Result Value Ref Range Status   Adenovirus NOT DETECTED NOT DETECTED Final   Coronavirus 229E NOT DETECTED NOT DETECTED Final    Comment: (NOTE) The Coronavirus on the Respiratory Panel, DOES NOT test for the novel  Coronavirus (2019 nCoV)    Coronavirus HKU1 NOT DETECTED NOT DETECTED Final   Coronavirus NL63 NOT DETECTED NOT DETECTED Final   Coronavirus OC43 NOT DETECTED NOT DETECTED Final   Metapneumovirus NOT DETECTED NOT DETECTED Final   Rhinovirus / Enterovirus NOT DETECTED NOT DETECTED Final   Influenza A NOT DETECTED NOT DETECTED Final   Influenza B NOT DETECTED NOT DETECTED Final   Parainfluenza Virus 1 NOT DETECTED NOT DETECTED Final   Parainfluenza Virus 2 NOT DETECTED NOT DETECTED Final   Parainfluenza Virus 3 NOT DETECTED NOT DETECTED Final   Parainfluenza Virus 4 NOT DETECTED NOT DETECTED Final   Respiratory Syncytial Virus NOT DETECTED NOT DETECTED Final   Bordetella pertussis NOT DETECTED NOT DETECTED Final   Chlamydophila pneumoniae NOT DETECTED NOT DETECTED Final   Mycoplasma pneumoniae NOT DETECTED NOT DETECTED Final    Comment: Performed at [facility], [place]  [address], [place], [place] [address]    Radiology Reports Dg Chest Port 1 View  Result Date: 10/19/2019 CLINICAL DATA:  Cough, altered mental status EXAM: PORTABLE CHEST 1 VIEW COMPARISON:  01/12/2019 FINDINGS: Left-sided implanted cardiac device with new generator. Leads in unchanged positioning. Median sternotomy with cardiac valve replacement. Stable cardiomegaly. Previously seen right upper lobe and right lower lobe airspace opacities have resolved. There is a hazy left lower lobe airspace opacity, new from prior. No pleural effusion or pneumothorax. IMPRESSION: 1. New hazy airspace opacity in the left lower lobe suspicious for pneumonia. 2. Resolved right upper lobe and right lower lobe airspace opacities. Electronically Signed   By: Davina Poke M.D.   On: 10/19/2019 15:03

## 2019-10-24 NOTE — Discharge Instructions (Signed)

## 2019-10-25 DIAGNOSIS — I5032 Chronic diastolic (congestive) heart failure: Secondary | ICD-10-CM

## 2019-10-25 LAB — COMPREHENSIVE METABOLIC PANEL
ALT: 29 U/L (ref 0–44)
AST: 36 U/L (ref 15–41)
Albumin: 3 g/dL — ABNORMAL LOW (ref 3.5–5.0)
Alkaline Phosphatase: 61 U/L (ref 38–126)
Anion gap: 9 (ref 5–15)
BUN: 21 mg/dL (ref 8–23)
CO2: 30 mmol/L (ref 22–32)
Calcium: 9.4 mg/dL (ref 8.9–10.3)
Chloride: 100 mmol/L (ref 98–111)
Creatinine, Ser: 0.65 mg/dL (ref 0.61–1.24)
GFR calc Af Amer: >60 mL/min (ref >60)
GFR calc non Af Amer: >60 mL/min (ref >60)
Glucose, Bld: 96 mg/dL (ref 70–99)
Potassium: 4.4 mmol/L (ref 3.5–5.1)
Sodium: 139 mmol/L (ref 135–145)
Total Bilirubin: 1 mg/dL (ref 0.3–1.2)
Total Protein: 5.8 g/dL — ABNORMAL LOW (ref 6.5–8.1)

## 2019-10-25 LAB — CBC
HCT: 43 % (ref 39.0–52.0)
Hemoglobin: 14.5 g/dL (ref 13.0–17.0)
MCH: 33 pg (ref 26.0–34.0)
MCHC: 33.7 g/dL (ref 30.0–36.0)
MCV: 97.7 fL (ref 80.0–100.0)
Platelets: 182 10*3/uL (ref 150–400)
RBC: 4.4 MIL/uL (ref 4.22–5.81)
RDW: 12 % (ref 11.5–15.5)
WBC: 8.2 10*3/uL (ref 4.0–10.5)
nRBC: 0 % (ref 0.0–0.2)

## 2019-10-25 LAB — C-REACTIVE PROTEIN: CRP: 1.9 mg/dL — ABNORMAL HIGH (ref <1.0)

## 2019-10-25 LAB — D-DIMER, QUANTITATIVE: D-Dimer, Quant: 2.66 ug/mL-FEU — ABNORMAL HIGH (ref 0.00–0.50)

## 2019-10-25 LAB — FERRITIN: Ferritin: 420 ng/mL — ABNORMAL HIGH (ref 24–336)

## 2019-10-25 MED ORDER — DEXAMETHASONE 2 MG PO TABS: 2.0000 mg | ORAL_TABLET | Freq: Every day | ORAL | Status: DC

## 2019-10-25 NOTE — Progress Notes (Signed)
Spoke with patient's daughter, updated her on patient's condition and answered any questions she had.

## 2019-10-25 NOTE — NC FL2 (Signed)
[place] LEVEL OF CARE SCREENING TOOL     IDENTIFICATION  Patient Name: Grant Lawrence Birthdate: 1931/05/25 Sex: male Admission Date (Current Location): 10/19/2019  [facility] and [place] Number:  Herbalist and Address:  [facility] [place], [place] [address] [place], [place] [address]      Provider Number: [id number]  Attending Physician Name and Address:  Jonetta Osgood, MD  Relative Name and Phone Number:  [name], daughter, [phone]    Current Level of Care: Hospital Recommended Level of Care: [place] Prior Approval Number:    Date Approved/Denied: 10/26/17 PASRR Number: [id number]  Discharge Plan: SNF    Current Diagnoses: Patient Active Problem List   Diagnosis Date Noted  . PNA (pneumonia) 10/19/2019  . Pacemaker battery depletion 05/19/2019  . ICD (implantable cardioverter-defibrillator) battery depletion   . FTT (failure to thrive) in adult   . Community acquired pneumonia of both lower lobes 01/13/2019  . Nausea and vomiting 01/13/2019  . Acute respiratory failure with hypoxia ([place]) 01/13/2019  . Recurrent aspiration pneumonia ([place]) 08/28/2018  . Coronary artery disease involving coronary bypass graft of native heart without angina pectoris 03/06/2018  . Hypercholesterolemia 03/06/2018  . Dementia ([place]) 09/09/2017  . BPH (benign prostatic hyperplasia) 07/22/2016  . Elevated PSA 07/22/2016  . PVCs (premature ventricular contractions) 07/03/2016  . History of repair of mitral valve 07/03/2016  . CAD (coronary artery disease)s/p CABG 2005 08/13/2014  . Mitral valve insufficiency s/p annuloplasty 2005 08/13/2014  . s/p CRT-D St Jude 2010,gen change Sept 2015 08/13/2014  . Elective replacement indicated for implantable cardioverter-defibrillator (ICD) reached July 03, 2014 08/13/2014  . HTN (hypertension) 08/13/2014  . Mixed hyperlipidemia 08/13/2014  . Aortic insufficiency 08/13/2014  .  Tricuspid insufficiency 08/13/2014  . Statin intolerance 08/13/2014  . CHB (complete heart block) s/p AV node ablation 08/13/2014  . Chronic diastolic heart failure ([place]) 08/13/2014  . A-fib ([place]) 08/12/2014    Orientation RESPIRATION BLADDER Height & Weight     Self  Normal External catheter, Incontinent(placed 11/10) Weight: 63.5 kg Height:  6' (182.9 cm)  BEHAVIORAL SYMPTOMS/MOOD NEUROLOGICAL BOWEL NUTRITION STATUS      Incontinent Diet(DYS 2 diet)  AMBULATORY STATUS COMMUNICATION OF NEEDS Skin   Limited Assist   Normal                       Personal Care Assistance Level of Assistance  Bathing, Feeding, Dressing Bathing Assistance: Limited assistance Feeding assistance: Independent Dressing Assistance: Limited assistance     Functional Limitations Info  Sight, Hearing, Speech Sight Info: Adequate Hearing Info: Adequate Speech Info: Adequate    SPECIAL CARE FACTORS FREQUENCY  PT (By licensed PT), OT (By licensed OT)     PT Frequency: 3x OT Frequency: 3x            Contractures Contractures Info: Not present    Additional Factors Info  Code Status, Allergies Code Status Info: DNR Allergies Info: Rosuvastatin Calcium, Statins           Current Medications (10/25/2019):  This is the current hospital active medication list Current Facility-Administered Medications  Medication Dose Route Frequency Provider Last Rate Last Dose  . 0.9 %  sodium chloride infusion   Intravenous Continuous Jonetta Osgood, MD 10 mL/hr at 10/24/19 1416    . acetaminophen (TYLENOL) tablet 650 mg  650 mg Oral Q6H PRN Regalado, Belkys A, MD       Or  .  acetaminophen (TYLENOL) suppository 650 mg  650 mg Rectal Q6H PRN Regalado, Belkys A, MD      . albuterol (VENTOLIN HFA) 108 (90 Base) MCG/ACT inhaler 1 puff  1 puff Inhalation Q2H PRN Regalado, Belkys A, MD      . apixaban (ELIQUIS) tablet 5 mg  5 mg Oral BID Regalado, Belkys A, MD   5 mg at 10/25/19 0912  . brimonidine  (ALPHAGAN) 0.2 % ophthalmic solution 1 drop  1 drop Both Eyes TID Regalado, Belkys A, MD   1 drop at 10/25/19 0912  . carvedilol (COREG) tablet 3.125 mg  3.125 mg Oral BID Regalado, Belkys A, MD   3.125 mg at 10/25/19 0912  . dexamethasone (DECADRON) tablet 6 mg  6 mg Oral Daily Matcha, Anupama, MD   6 mg at 10/25/19 0912  . divalproex (DEPAKOTE SPRINKLE) capsule 125 mg  125 mg Oral BID Matcha, Anupama, MD   125 mg at 10/25/19 0911  . donepezil (ARICEPT) tablet 10 mg  10 mg Oral QHS Regalado, Belkys A, MD   10 mg at 10/24/19 2155  . folic acid (FOLVITE) tablet 1 mg  1 mg Oral Daily Regalado, Belkys A, MD   1 mg at 10/25/19 0912  . guaiFENesin (ROBITUSSIN) 100 MG/5ML solution 300 mg  15 mL Oral Q6H Matcha, Anupama, MD   300 mg at 10/25/19 0912  . mirtazapine (REMERON) tablet 15 mg  15 mg Oral QHS Regalado, Belkys A, MD   15 mg at 10/24/19 2155  . multivitamin with minerals tablet 1 tablet  1 tablet Oral Daily Regalado, Belkys A, MD   1 tablet at 10/25/19 0912  . nystatin (MYCOSTATIN/NYSTOP) topical powder 1 g  1 g Topical Q6H PRN Regalado, Belkys A, MD      . ondansetron (ZOFRAN) tablet 4 mg  4 mg Oral Q6H PRN Regalado, Belkys A, MD       Or  . ondansetron (ZOFRAN) injection 4 mg  4 mg Intravenous Q6H PRN Regalado, Belkys A, MD      . phenylephrine-shark liver oil-mineral oil-petrolatum (PREPARATION H) rectal ointment 1 application  1 application Rectal BID PRN Regalado, Cassie Freer, MD      . Resource ThickenUp Clear   Oral PRN Yaakov Guthrie, MD      . Resource ThickenUp Clear   Oral PRN Jonetta Osgood, MD      . tamsulosin (FLOMAX) capsule 0.4 mg  0.4 mg Oral QHS Regalado, Belkys A, MD   0.4 mg at 10/24/19 2155  . vitamin C (ASCORBIC ACID) tablet 500 mg  500 mg Oral Daily Dana Allan I, MD   500 mg at 10/25/19 0912  . zinc oxide 20 % ointment   Topical PRN Regalado, Belkys A, MD      . zinc sulfate capsule 220 mg  220 mg Oral Daily Dana Allan I, MD   220 mg at 10/25/19 0911      Discharge Medications: Please see discharge summary for a list of discharge medications.  Relevant Imaging Results:  Relevant Lab Results:   Additional Information [id number]  Carles Collet, RN

## 2019-10-25 NOTE — Discharge Summary (Addendum)
PATIENT DETAILS Name: Grant Lawrence Age: 85 y.o. Sex: male Date of Birth: Aug 04, 1931 MRN: [id number]. Admitting Physician: Elmarie Shiley, MD [id number], [facility]  Admit Date: 10/19/2019 Discharge date: 10/27/2019  Recommendations for Outpatient Follow-up:  1. Follow up with PCP in 1-2 weeks 2. Please obtain CMP/CBC in one week 3. Repeat Chest Xray in 4-6 week  Admitted From:  SNF  Disposition: SNF   Home Health: Yes  Equipment/Devices: None  Discharge Condition: stable  CODE STATUS: DNR  Diet recommendation:  Diet Order            Diet - low sodium heart healthy        DIET DYS 2 Room service appropriate? Yes; Fluid consistency: Nectar Thick  Diet effective now               Brief Summary: See H&P, Labs, Consult and Test reports for all details in brief, Patient is a 85 y.o. male with PMHx of dementia, chronic diastolic heart failure, complete heart block-s/p permanent pacemaker implantation, permanent atrial fibrillation, HTN, s/p mitral valve annuloplasty-transferred from SNF on 11/10 for evaluation of fever, confusion-further evaluation revealed COVID-19 pneumonia.  See below for further details.  Brief Hospital Course: Acute Hypoxic Resp Failure due to Covid 19 Viral pneumonia:  Improved-has been on room air for the past few days-inflammatory markers are down trending.  Treated with remdesivir and steroids.  Will be on a few more days of tapering steroids.  Stable for discharge  COVID-19 Labs:  Recent Labs    10/25/19 0315 10/26/19 1015  DDIMER 2.66* 3.31*  FERRITIN 420* 456*  CRP 1.9* 1.2*    Lab Results  Component Value Date   SARSCOV2NAA POSITIVE (A) 10/19/2019     COVID-19 Medications: Steroids: 11/11>> Remdesivir: 11/11>>11/15 Actemra: Not given-as patient not significantly hypoxic. Convalescent Plasma: Not given Research Studies:N/A  Acute metabolic encephalopathy superimposed on dementia:  encephalopathy likely  secondary to COVID-19 pneumonia-superimposed on dementia with delirium.  Encephalopathy improved-he is back to his baseline-he remains pleasantly confused.   CAD s/p CABG August 2005 and mitral valve annuloplasty for severe MR: No anginal symptoms-continue supportive care  Permanent atrial fibrillation s/p AV node ablation October 2010-complicated by complete heart block-requiring permanent pacemaker implantation: Rate controlled with Coreg-anticoagulated with Eliquis.    Primary MD at SNF-we will need to discuss with patient/family regarding how long anticoagulation can be continued in this frail gentleman.  Macrocytosis/thrombocytopenia: Appears to be chronic-we will need outpatient work-up-but suspect further work-up would not change outcome or management given advanced dementia/frailty.  BPH: Continue Flomax  Dementia: Continue Aricept and Depakote  Failure to thrive syndrome: Very frail-deconditioned-appears to have advanced dementia-seems to be tolerating dysphagia 2 diet.  Appreciate PT/OT eval.  Hard of hearing  ?  UTI: No symptoms-suspect asymptomatic bacteriuria-does not need antimicrobial therapy at this point   Procedures/Studies: None  Discharge Diagnoses:  Active Problems:   CAD (coronary artery disease)s/p CABG 2005   Mitral valve insufficiency s/p annuloplasty 2005   s/p CRT-D [employer] Jude 2010,gen change Sept 2015   HTN (hypertension)   Tricuspid insufficiency   CHB (complete heart block) s/p AV node ablation   Chronic diastolic heart failure (HCC)   Dementia (HCC)   FTT (failure to thrive) in adult   PNA (pneumonia)   COVID-19 virus infection   Discharge Instructions:    Person Under Monitoring Name: Grant Lawrence  Location: [place] [address]   Infection Prevention Recommendations for Individuals Confirmed  to have, or Being Evaluated for, 2019 Novel Coronavirus (COVID-19) Infection Who Receive Care at Home  Individuals who are  confirmed to have, or are being evaluated for, COVID-19 should follow the prevention steps below until a healthcare provider or local or state health department says they can return to normal activities.  Stay home except to get medical care You should restrict activities outside your home, except for getting medical care. Do not go to work, school, or public areas, and do not use public transportation or taxis.  Call ahead before visiting your doctor Before your medical appointment, call the healthcare provider and tell them that you have, or are being evaluated for, COVID-19 infection. This will help the healthcare provider's office take steps to keep other people from getting infected. Ask your healthcare provider to call the local or state health department.  Monitor your symptoms Seek prompt medical attention if your illness is worsening (e.g., difficulty breathing). Before going to your medical appointment, call the healthcare provider and tell them that you have, or are being evaluated for, COVID-19 infection. Ask your healthcare provider to call the local or state health department.  Wear a facemask You should wear a facemask that covers your nose and mouth when you are in the same room with other people and when you visit a healthcare provider. People who live with or visit you should also wear a facemask while they are in the same room with you.  Separate yourself from other people in your home As much as possible, you should stay in a different room from other people in your home. Also, you should use a separate bathroom, if available.  Avoid sharing household items You should not share dishes, drinking glasses, cups, eating utensils, towels, bedding, or other items with other people in your home. After using these items, you should wash them thoroughly with soap and water.  Cover your coughs and sneezes Cover your mouth and nose with a tissue when you cough or sneeze, or  you can cough or sneeze into your sleeve. Throw used tissues in a lined trash can, and immediately wash your hands with soap and water for at least 20 seconds or use an alcohol-based hand rub.  Wash your [facility] your hands often and thoroughly with soap and water for at least 20 seconds. You can use an alcohol-based hand sanitizer if soap and water are not available and if your hands are not visibly dirty. Avoid touching your eyes, nose, and mouth with unwashed hands.   Prevention Steps for Caregivers and Household Members of Individuals Confirmed to have, or Being Evaluated for, COVID-19 Infection Being Cared for in the Home  If you live with, or provide care at home for, a person confirmed to have, or being evaluated for, COVID-19 infection please follow these guidelines to prevent infection:  Follow healthcare provider's instructions Make sure that you understand and can help the patient follow any healthcare provider instructions for all care.  Provide for the patient's basic needs You should help the patient with basic needs in the home and provide support for getting groceries, prescriptions, and other personal needs.  Monitor the patient's symptoms If they are getting sicker, call his or her medical provider and tell them that the patient has, or is being evaluated for, COVID-19 infection. This will help the healthcare provider's office take steps to keep other people from getting infected. Ask the healthcare provider to call the local or state health department.  Limit the  number of people who have contact with the patient  If possible, have only one caregiver for the patient.  Other household members should stay in another home or place of residence. If this is not possible, they should stay  in another room, or be separated from the patient as much as possible. Use a separate bathroom, if available.  Restrict visitors who do not have an essential need to be in the  home.  Keep older adults, very young children, and other sick people away from the patient Keep older adults, very young children, and those who have compromised immune systems or chronic health conditions away from the patient. This includes people with chronic heart, lung, or kidney conditions, diabetes, and cancer.  Ensure good ventilation Make sure that shared spaces in the home have good air flow, such as from an air conditioner or an opened window, weather permitting.  Wash your hands often  Wash your hands often and thoroughly with soap and water for at least 20 seconds. You can use an alcohol based hand sanitizer if soap and water are not available and if your hands are not visibly dirty.  Avoid touching your eyes, nose, and mouth with unwashed hands.  Use disposable paper towels to dry your hands. If not available, use dedicated cloth towels and replace them when they become wet.  Wear a facemask and gloves  Wear a disposable facemask at all times in the room and gloves when you touch or have contact with the patient's blood, body fluids, and/or secretions or excretions, such as sweat, saliva, sputum, nasal mucus, vomit, urine, or feces.  Ensure the mask fits over your nose and mouth tightly, and do not touch it during use.  Throw out disposable facemasks and gloves after using them. Do not reuse.  Wash your hands immediately after removing your facemask and gloves.  If your personal clothing becomes contaminated, carefully remove clothing and launder. Wash your hands after handling contaminated clothing.  Place all used disposable facemasks, gloves, and other waste in a lined container before disposing them with other household waste.  Remove gloves and wash your hands immediately after handling these items.  Do not share dishes, glasses, or other household items with the patient  Avoid sharing household items. You should not share dishes, drinking glasses, cups, eating  utensils, towels, bedding, or other items with a patient who is confirmed to have, or being evaluated for, COVID-19 infection.  After the person uses these items, you should wash them thoroughly with soap and water.  Wash laundry thoroughly  Immediately remove and wash clothes or bedding that have blood, body fluids, and/or secretions or excretions, such as sweat, saliva, sputum, nasal mucus, vomit, urine, or feces, on them.  Wear gloves when handling laundry from the patient.  Read and follow directions on labels of laundry or clothing items and detergent. In general, wash and dry with the warmest temperatures recommended on the label.  Clean all areas the individual has used often  Clean all touchable surfaces, such as counters, tabletops, doorknobs, bathroom fixtures, toilets, phones, keyboards, tablets, and bedside tables, every day. Also, clean any surfaces that may have blood, body fluids, and/or secretions or excretions on them.  Wear gloves when cleaning surfaces the patient has come in contact with.  Use a diluted bleach solution (e.g., dilute bleach with 1 part bleach and 10 parts water) or a household disinfectant with a label that says EPA-registered for coronaviruses. To make a bleach solution at home,  add 1 tablespoon of bleach to 1 quart (4 cups) of water. For a larger supply, add  cup of bleach to 1 gallon (16 cups) of water.  Read labels of cleaning products and follow recommendations provided on product labels. Labels contain instructions for safe and effective use of the cleaning product including precautions you should take when applying the product, such as wearing gloves or eye protection and making sure you have good ventilation during use of the product.  Remove gloves and wash hands immediately after cleaning.  Monitor yourself for signs and symptoms of illness Caregivers and household members are considered close contacts, should monitor their health, and will be  asked to limit movement outside of the home to the extent possible. Follow the monitoring steps for close contacts listed on the symptom monitoring form.   ? If you have additional questions, contact your local health department or call the epidemiologist on call at [phone] (available 24/7). ? This guidance is subject to change. For the most up-to-date guidance from [facility], please refer to their website: [email or web address]    Activity:  As tolerated with Full fall precautions use walker/cane & assistance as needed Discharge Instructions    Diet - low sodium heart healthy   Complete by: As directed    Increase activity slowly   Complete by: As directed      Allergies as of 10/27/2019      Reactions   Rosuvastatin Calcium Other (See Comments)   Myalgia   Statins Other (See Comments)   Myalgia      Medication List    TAKE these medications   acetaminophen 325 MG tablet Commonly known as: TYLENOL Take 650 mg by mouth every 4 (four) hours as needed for mild pain. Do not exceed 3g in 24hrs.   apixaban 5 MG Tabs tablet Commonly known as: Eliquis Take 1 tablet (5 mg total) by mouth 2 (two) times daily.   brimonidine 0.2 % ophthalmic solution Commonly known as: ALPHAGAN Place 1 drop into both eyes 3 (three) times daily.   carvedilol 3.125 MG tablet Commonly known as: COREG TAKE ONE TABLET BY MOUTH TWICE DAILY WITH MEALS   CHLORASEPTIC MAX SORE THROAT MT Use as directed 2 sprays in the mouth or throat daily as needed (sore throat).   dexamethasone 2 MG tablet Commonly known as: DECADRON Take 1 tablet (2 mg total) by mouth daily for 1 day. Start taking on: October 28, 2019   divalproex 125 MG DR tablet Commonly known as: DEPAKOTE Take 125 mg by mouth 2 (two) times daily.   docusate sodium 100 MG capsule Commonly known as: COLACE Take 100 mg by mouth 2 (two) times daily as needed for mild constipation.    donepezil 10 MG tablet Commonly known as: ARICEPT Take 10 mg by mouth at bedtime.   ezetimibe 10 MG tablet Commonly known as: ZETIA Take 1 tablet (10 mg total) daily by mouth. What changed: when to take this   finasteride 5 MG tablet Commonly known as: PROSCAR Take 5 mg by mouth daily.   Hemorrhoidal 1-0.25-14.4-15 % Crea Generic drug: Pramox-PE-Glycerin-Petrolatum Apply 1 application topically daily as needed (hemorrhoids).   Menthol-Zinc Oxide 0.44-20.6 % Oint Apply 1 application topically daily as needed (rash).   mirtazapine 15 MG tablet Commonly known as: REMERON Take 15 mg by mouth at bedtime.   Nutritional Supplement Liqd Take 120 mLs by mouth 3 (three) times daily. House supplement   nystatin powder Generic drug: nystatin Apply 1 g topically every  6 (six) hours as needed (rash).   tamsulosin 0.4 MG Caps capsule Commonly known as: FLOMAX Take 0.4 mg by mouth at bedtime.       Contact information for follow-up providers    [facility]call[facility], [facility]. Schedule an appointment as soon as possible for a visit in 1 week(s).   Specialty: Geriatric Medicine Contact information: [place] [address] [phone]        Croitoru, Dani Gobble, MD. Schedule an appointment as soon as possible for a visit in 1 month(s).   Specialty: Cardiology Contact information: [address] [place] [address] [phone]            Contact information for after-discharge care    Destination    [facility] .   Service: Skilled Nursing Contact information: [place] [address] [phone]                 Allergies  Allergen Reactions  . Rosuvastatin Calcium Other (See Comments)    Myalgia  . Statins Other (See Comments)    Myalgia    Consultations:   None   Other Procedures/Studies: Dg Chest Port 1 View  Result Date: 10/19/2019 CLINICAL DATA:  Cough, altered  mental status EXAM: PORTABLE CHEST 1 VIEW COMPARISON:  01/12/2019 FINDINGS: Left-sided implanted cardiac device with new generator. Leads in unchanged positioning. Median sternotomy with cardiac valve replacement. Stable cardiomegaly. Previously seen right upper lobe and right lower lobe airspace opacities have resolved. There is a hazy left lower lobe airspace opacity, new from prior. No pleural effusion or pneumothorax. IMPRESSION: 1. New hazy airspace opacity in the left lower lobe suspicious for pneumonia. 2. Resolved right upper lobe and right lower lobe airspace opacities. Electronically Signed   By: Davina Poke M.D.   On: 10/19/2019 15:03     TODAY-DAY OF DISCHARGE:  Subjective:   [name] today remains pleasantly confused-not on oxygen.  Per RN no major events overnight.  Objective:   Blood pressure 131/67, pulse 76, temperature 97.6 F (36.4 C), temperature source Axillary, resp. rate 19, height 6' (1.829 m), weight 63.5 kg, SpO2 92 %.  Intake/Output Summary (Last 24 hours) at 10/27/2019 0932 Last data filed at 10/27/2019 0500 Gross per 24 hour  Intake 118 ml  Output 1000 ml  Net -882 ml   Filed Weights   10/19/19 1735  Weight: 63.5 kg    Exam: Awake but pleasantly confused, No new F.N deficits, Normal affect [place].AT,PERRAL Supple Neck,No JVD, No cervical lymphadenopathy appriciated.  Symmetrical Chest wall movement, Good air movement bilaterally, CTAB RRR,No Gallops,Rubs or new Murmurs, No Parasternal Heave +ve B.Sounds, Abd Soft, Non tender, No organomegaly appriciated, No rebound -guarding or rigidity. No Cyanosis, Clubbing or edema, No new Rash or bruise   PERTINENT RADIOLOGIC STUDIES: Dg Chest Port 1 View  Result Date: 10/19/2019 CLINICAL DATA:  Cough, altered mental status EXAM: PORTABLE CHEST 1 VIEW COMPARISON:  01/12/2019 FINDINGS: Left-sided implanted cardiac device with new generator. Leads in unchanged positioning. Median sternotomy with cardiac  valve replacement. Stable cardiomegaly. Previously seen right upper lobe and right lower lobe airspace opacities have resolved. There is a hazy left lower lobe airspace opacity, new from prior. No pleural effusion or pneumothorax. IMPRESSION: 1. New hazy airspace opacity in the left lower lobe suspicious for pneumonia. 2. Resolved right upper lobe and right lower lobe airspace opacities. Electronically Signed   By: Davina Poke M.D.   On: 10/19/2019 15:03  PERTINENT LAB RESULTS: CBC: Recent Labs    10/26/19 1015 10/27/19 0625  WBC 9.7 13.1*  HGB 15.6 13.8  HCT 45.4 39.8  PLT 213 209   CMET CMP     Component Value Date/Time   NA 138 10/27/2019 0625   NA 141 03/27/2017   K 4.1 10/27/2019 0625   CL 102 10/27/2019 0625   CO2 27 10/27/2019 0625   GLUCOSE 91 10/27/2019 0625   BUN 30 (H) 10/27/2019 0625   BUN 19 03/27/2017   CREATININE 0.70 10/27/2019 0625   CREATININE 0.84 06/27/2015 0800   CALCIUM 9.4 10/27/2019 0625   PROT 5.2 (L) 10/27/2019 0625   ALBUMIN 2.7 (L) 10/27/2019 0625   AST 27 10/27/2019 0625   ALT 31 10/27/2019 0625   ALKPHOS 59 10/27/2019 0625   BILITOT 0.7 10/27/2019 0625   GFRNONAA >60 10/27/2019 0625   GFRAA >60 10/27/2019 0625    GFR Estimated Creatinine Clearance: 59.5 mL/min (by C-G formula based on SCr of 0.7 mg/dL). No results for input(s): LIPASE, AMYLASE in the last 72 hours. No results for input(s): CKTOTAL, CKMB, CKMBINDEX, TROPONINI in the last 72 hours. Invalid input(s): POCBNP Recent Labs    10/25/19 0315 10/26/19 1015  DDIMER 2.66* 3.31*   No results for input(s): HGBA1C in the last 72 hours. No results for input(s): CHOL, HDL, LDLCALC, TRIG, CHOLHDL, LDLDIRECT in the last 72 hours. No results for input(s): TSH, T4TOTAL, T3FREE, THYROIDAB in the last 72 hours.  Invalid input(s): FREET3 Recent Labs    10/25/19 0315 10/26/19 1015  FERRITIN 420* 456*   Coags: No results for input(s): INR in the last 72 hours.  Invalid  input(s): PT Microbiology: Recent Results (from the past 240 hour(s))  Blood Culture (routine x 2)     Status: None   Collection Time: 10/19/19  2:09 PM   Specimen: Site Not Specified; Blood  Result Value Ref Range Status   Specimen Description   Final    SITE NOT SPECIFIED Performed at [facility] [address], [place], [place] [address]    Special Requests   Final    BOTTLES DRAWN AEROBIC AND ANAEROBIC Blood Culture results may not be optimal due to an inadequate volume of blood received in culture bottles Performed at [facility] [address], [place], [place] [address]    Culture   Final    NO GROWTH 5 DAYS Performed at [facility], [place] [address] [place], [place] [address]    Report Status 10/24/2019 FINAL  Final  Blood Culture (routine x 2)     Status: None   Collection Time: 10/19/19  2:09 PM   Specimen: Site Not Specified; Blood  Result Value Ref Range Status   Specimen Description   Final    SITE NOT SPECIFIED Performed at [facility] [address], [place], [place] [address]    Special Requests   Final    BOTTLES DRAWN AEROBIC AND ANAEROBIC Blood Culture results may not be optimal due to an inadequate volume of blood received in culture bottles Performed at [facility] [address], [place], [place] [address]    Culture   Final    NO GROWTH 5 DAYS Performed at [facility], [place] [address] [place], [place] [address]    Report Status 10/24/2019 FINAL  Final  Urine culture     Status: Abnormal   Collection Time: 10/19/19  2:09 PM   Specimen:  In/Out Cath Urine  Result Value Ref Range Status   Specimen Description   Final    IN/OUT CATH URINE Performed at [facility], [place] [address], [place], [place] [address]    Special Requests   Final    NONE Performed at [facility], [place] [address] [place], [place] [address]     Culture 60,000 COLONIES/mL STAPHYLOCOCCUS EPIDERMIDIS (A)  Final   Report Status 10/21/2019 FINAL  Final   Organism ID, Bacteria STAPHYLOCOCCUS EPIDERMIDIS (A)  Final      Susceptibility   Staphylococcus epidermidis - MIC*    CIPROFLOXACIN <=0.5 SENSITIVE Sensitive     GENTAMICIN <=0.5 SENSITIVE Sensitive     NITROFURANTOIN <=16 SENSITIVE Sensitive     OXACILLIN RESISTANT Resistant     TETRACYCLINE 2 SENSITIVE Sensitive     VANCOMYCIN 1 SENSITIVE Sensitive     TRIMETH/SULFA <=10 SENSITIVE Sensitive     CLINDAMYCIN <=0.25 SENSITIVE Sensitive     RIFAMPIN <=0.5 SENSITIVE Sensitive     Inducible Clindamycin NEGATIVE Sensitive     * 60,000 COLONIES/mL STAPHYLOCOCCUS EPIDERMIDIS  SARS CORONAVIRUS 2 (TAT 6-24 HRS) Nasopharyngeal Nasopharyngeal Swab     Status: Abnormal   Collection Time: 10/19/19  2:09 PM   Specimen: Nasopharyngeal Swab  Result Value Ref Range Status   SARS Coronavirus 2 POSITIVE (A) NEGATIVE Final    Comment: RESULT CALLED TO, READ BACK BY AND VERIFIED WITH: SAKYI R, RN AT 2206 ON 10/19/2019 BY SAINVILUS S (NOTE) SARS-CoV-2 target nucleic acids are DETECTED. The SARS-CoV-2 RNA is generally detectable in upper and lower respiratory specimens during the acute phase of infection. Positive results are indicative of active infection with SARS-CoV-2. Clinical  correlation with patient history and other diagnostic information is necessary to determine patient infection status. Positive results do  not rule out bacterial infection or co-infection with other viruses. The expected result is Negative. Fact Sheet for Patients: SugarRoll.be Fact Sheet for Healthcare Providers: https://www.woods-mathews.com/ This test is not yet approved or cleared by the [place] FDA and  has been authorized for detection and/or diagnosis of SARS-CoV-2 by FDA under an Emergency Use Authorization (EUA). This EUA will remain  in effect (meaning this  test can b e used) for the duration of the COVID-19 declaration under Section 564(b)(1) of the Act, 21 U.S.C. section 360bbb-3(b)(1), unless the authorization is terminated or revoked sooner. Performed at [facility], [place] [address], [place], [place] [address]   MRSA PCR Screening     Status: None   Collection Time: 10/19/19  5:53 PM   Specimen: Nasal Mucosa; Nasopharyngeal  Result Value Ref Range Status   MRSA by PCR NEGATIVE NEGATIVE Final    Comment:        The GeneXpert MRSA Assay (FDA approved for NASAL specimens only), is one component of a comprehensive MRSA colonization surveillance program. It is not intended to diagnose MRSA infection nor to guide or monitor treatment for MRSA infections. Performed at [facility], [place] [address], [place], [place] [address]   Respiratory Panel by PCR     Status: None   Collection Time: 10/20/19  3:13 PM   Specimen: Nasopharyngeal Swab; Respiratory  Result Value Ref Range Status   Adenovirus NOT DETECTED NOT DETECTED Final   Coronavirus 229E NOT DETECTED NOT DETECTED Final    Comment: (NOTE) The Coronavirus on the Respiratory Panel, DOES NOT test for the novel  Coronavirus (2019 nCoV)    Coronavirus HKU1 NOT DETECTED NOT  DETECTED Final   Coronavirus NL63 NOT DETECTED NOT DETECTED Final   Coronavirus OC43 NOT DETECTED NOT DETECTED Final   Metapneumovirus NOT DETECTED NOT DETECTED Final   Rhinovirus / Enterovirus NOT DETECTED NOT DETECTED Final   Influenza A NOT DETECTED NOT DETECTED Final   Influenza B NOT DETECTED NOT DETECTED Final   Parainfluenza Virus 1 NOT DETECTED NOT DETECTED Final   Parainfluenza Virus 2 NOT DETECTED NOT DETECTED Final   Parainfluenza Virus 3 NOT DETECTED NOT DETECTED Final   Parainfluenza Virus 4 NOT DETECTED NOT DETECTED Final   Respiratory Syncytial Virus NOT DETECTED NOT DETECTED Final   Bordetella pertussis NOT DETECTED NOT DETECTED Final   Chlamydophila pneumoniae NOT  DETECTED NOT DETECTED Final   Mycoplasma pneumoniae NOT DETECTED NOT DETECTED Final    Comment: Performed at [facility], [place] [address] [place], [place] [address]    FURTHER DISCHARGE INSTRUCTIONS:  Get Medicines reviewed and adjusted: Please take all your medications with you for your next visit with your Primary MD  Laboratory/radiological data: Please request your Primary MD to go over all hospital tests and procedure/radiological results at the follow up, please ask your Primary MD to get all Hospital records sent to his/her office.  In some cases, they will be blood work, cultures and biopsy results pending at the time of your discharge. Please request that your primary care M.D. goes through all the records of your hospital data and follows up on these results.  Also Note the following: If you experience worsening of your admission symptoms, develop shortness of breath, life threatening emergency, suicidal or homicidal thoughts you must seek medical attention immediately by calling 911 or calling your MD immediately  if symptoms less severe.  You must read complete instructions/literature along with all the possible adverse reactions/side effects for all the Medicines you take and that have been prescribed to you. Take any new Medicines after you have completely understood and accpet all the possible adverse reactions/side effects.   Do not drive when taking Pain medications or sleeping medications (Benzodaizepines)  Do not take more than prescribed Pain, Sleep and Anxiety Medications. It is not advisable to combine anxiety,sleep and pain medications without talking with your primary care practitioner  Special Instructions: If you have smoked or chewed Tobacco  in the last 2 yrs please stop smoking, stop any regular Alcohol  and or any Recreational drug use.  Wear Seat belts while driving.  Please note: You were cared for by a hospitalist during your hospital stay. Once  you are discharged, your primary care physician will handle any further medical issues. Please note that NO REFILLS for any discharge medications will be authorized once you are discharged, as it is imperative that you return to your primary care physician (or establish a relationship with a primary care physician if you do not have one) for your post hospital discharge needs so that they can reassess your need for medications and monitor your lab values.  Total Time spent coordinating discharge including counseling, education and face to face time equals 35 minutes.  SignedOren Binet 10/27/2019 9:32 AM

## 2019-10-25 NOTE — Care Management Important Message (Deleted)
Important Message  Patient Details  Name: Grant Lawrence MRN: [id number] Date of Birth: December 04, 1931   Medicare Important Message Given:  Yes - Important Message mailed due to current National Emergency  Verbal consent obtained due to current National Emergency  Relationship to patient: Niece/Nephew Contact Name: Gevena Mart Call Date: 10/25/19  Time: 1606 Phone: [phone] Outcome: Spoke with contact Important Message mailed to: Patient address on file    Delorse Lek 10/25/2019, 4:07 PM

## 2019-10-25 NOTE — Care Management Important Message (Signed)
Important Message  Patient Details  Name: ABDIQANI METZINGER MRN: [id number] Date of Birth: 1930-12-31   Medicare Important Message Given:  Yes - Important Message mailed due to current National Emergency  Verbal consent obtained due to current National Emergency  Relationship to patient: Child Contact Name: Berniece Salines Call Date: 10/25/19  Time: 1624 Phone: [phone] Outcome: Spoke with contact Important Message mailed to: Other (must enter comment)(emailed to [email or web address])    Toula Miyasaki P Timaya Bojarski 10/25/2019, 4:25 PM

## 2019-10-25 NOTE — Care Management (Addendum)
11: 8 Spoke w daughter [name] who states that she has been in contact with the head nurse Javier Glazier at [facility] who has informed her that her dad can return. Called [facility] main number [phone], awaiting a return call from [facility]. Patient resides there in an apartment with wife.  12:00 Received call from Javier Glazier at [facility]. Nanine Means states that they cannot take patient back. She stated that the 21st would be the first day that he would be eligible to return and would have to test negative, however he would not be eligible for retesting until 12/1. Nanine Means is to update daughter and let her know he can't come back. I will follow up with daughter to discuss other options.  12:20 Spoke w daughter and she is agreeable to fax out to 3 covid SNFs. Will update FL2 w [facility] and fax out.  14:00 Patient is [facility]. Reference number for case is [id number]. No clinicals are needed to be faxed as this is COVID patient 16:30 Notified by Everlene Balls that needed clinicals, faxed to [phone] 17:00 no offers in HUB accepted, will need offers presented to daughter when available.

## 2019-10-26 DIAGNOSIS — U071 COVID-19: Secondary | ICD-10-CM

## 2019-10-26 LAB — FERRITIN: Ferritin: 456 ng/mL — ABNORMAL HIGH (ref 24–336)

## 2019-10-26 LAB — COMPREHENSIVE METABOLIC PANEL
ALT: 37 U/L (ref 0–44)
AST: 40 U/L (ref 15–41)
Albumin: 3.4 g/dL — ABNORMAL LOW (ref 3.5–5.0)
Alkaline Phosphatase: 67 U/L (ref 38–126)
Anion gap: 8 (ref 5–15)
BUN: 22 mg/dL (ref 8–23)
CO2: 31 mmol/L (ref 22–32)
Calcium: 9.6 mg/dL (ref 8.9–10.3)
Chloride: 99 mmol/L (ref 98–111)
Creatinine, Ser: 0.74 mg/dL (ref 0.61–1.24)
GFR calc Af Amer: >60 mL/min (ref >60)
GFR calc non Af Amer: >60 mL/min (ref >60)
Glucose, Bld: 132 mg/dL — ABNORMAL HIGH (ref 70–99)
Potassium: 4.2 mmol/L (ref 3.5–5.1)
Sodium: 138 mmol/L (ref 135–145)
Total Bilirubin: 1.3 mg/dL — ABNORMAL HIGH (ref 0.3–1.2)
Total Protein: 6.3 g/dL — ABNORMAL LOW (ref 6.5–8.1)

## 2019-10-26 LAB — CBC
HCT: 45.4 % (ref 39.0–52.0)
Hemoglobin: 15.6 g/dL (ref 13.0–17.0)
MCH: 33.3 pg (ref 26.0–34.0)
MCHC: 34.4 g/dL (ref 30.0–36.0)
MCV: 96.8 fL (ref 80.0–100.0)
Platelets: 213 10*3/uL (ref 150–400)
RBC: 4.69 MIL/uL (ref 4.22–5.81)
RDW: 12.3 % (ref 11.5–15.5)
WBC: 9.7 10*3/uL (ref 4.0–10.5)
nRBC: 0 % (ref 0.0–0.2)

## 2019-10-26 LAB — LEGIONELLA PNEUMOPHILA SEROGP 1 UR AG: L. pneumophila Serogp 1 Ur Ag: NEGATIVE

## 2019-10-26 LAB — C-REACTIVE PROTEIN: CRP: 1.2 mg/dL — ABNORMAL HIGH (ref <1.0)

## 2019-10-26 LAB — D-DIMER, QUANTITATIVE: D-Dimer, Quant: 3.31 ug/mL-FEU — ABNORMAL HIGH (ref 0.00–0.50)

## 2019-10-26 MED ORDER — DEXAMETHASONE 4 MG PO TABS
2.0000 mg | ORAL_TABLET | Freq: Every day | ORAL | Status: DC
  Administered 2019-10-27: 09:00:00 2 mg via ORAL
  Filled 2019-10-26: qty 1

## 2019-10-26 NOTE — Progress Notes (Signed)
Physical Therapy Treatment Patient Details Name: Grant Lawrence MRN: [id number] DOB: [birth year] Today's Date: 10/26/2019    History of Present Illness 85 y/o m w/ hx HTN, HLD, Dementia, CAD, CHF, cardiac pacemake, cardiac defibrilator asymptomatic microscopic hematuria, angina ppectoris, a-fib, mitral valve repair/ CABG, dx COVID+ 11/10    PT Comments    Pt is more fatigued this am and needing increased assistance with most functional mobility. His 02 and 02 saturation are in 90s- 100 throughout. Pt needing max a x 2 to roll in bed and scoot up. Supine to sit with max a, able to sit unsupported but has feet on ground and BUE on bed, attempted standing but unable to stand w/o total a this am. Also noted with mobility that pt has soiled himself and that feces has blood in it (nurse was notified).    Follow Up Recommendations  SNF     Equipment Recommendations  None recommended by PT    Recommendations for Other Services       Precautions / Restrictions Precautions Precautions: Fall;ICD/Pacemaker Precaution Comments: cognition. has been combative Required Braces or Orthoses: Other Brace(R wrist in brace) Restrictions Weight Bearing Restrictions: No    Mobility  Bed Mobility Overal bed mobility: Needs Assistance Bed Mobility: Supine to Sit;Sit to Supine     Supine to sit: Max assist Sit to supine: Max assist   General bed mobility comments: max x 2 to fully roll L/R, scoot  Transfers Overall transfer level: Needs assistance Equipment used: 1 person hand held assist Transfers: Sit to/from Stand Sit to Stand: Total assist         General transfer comment: unable to stand w/o total a today, attempts to stand on his own while sitting EOB but not sucessfull, becomes agiatted at this   Ambulation/Gait             General Gait Details: unable to ambulate today, unable to stand without total a   Stairs             Wheelchair Mobility    Modified Rankin  (Stroke Patients Only)       Balance Overall balance assessment: Needs assistance Sitting-balance support: Feet unsupported;Bilateral upper extremity supported Sitting balance-Leahy Scale: Fair     Standing balance support: Bilateral upper extremity supported Standing balance-Leahy Scale: Zero                              Cognition Arousal/Alertness: Lethargic Behavior During Therapy: Flat affect Overall Cognitive Status: History of cognitive impairments - at baseline                                        Exercises      General Comments General comments (skin integrity, edema, etc.): Pt seems to need increased a with mobility this am, he is more willing to attempt tasks and attempt tasks on his own but shows decreased strength and activity tolerance, while attempting to stand from EOB noted that pt had soiled himself and feces seem to have blood (nurse was notified of this)      Pertinent Vitals/Pain Pain Assessment: Faces Faces Pain Scale: Hurts even more Pain Location: w/ any movement of limbs or in bed Pain Descriptors / Indicators: Grimacing;Guarding Pain Intervention(s): Limited activity within patient's tolerance    Home Living  Prior Function            PT Goals (current goals can now be found in the care plan section) Acute Rehab PT Goals Patient Stated Goal: states I cant do this any more Time For Goal Achievement: 12-04-19 Potential to Achieve Goals: Fair Progress towards PT goals: Not progressing toward goals - comment    Frequency    Min 2X/week      PT Plan Current plan remains appropriate    Co-evaluation              AM-PAC PT "6 Clicks" Mobility   Outcome Measure  Help needed turning from your back to your side while in a flat bed without using bedrails?: Total Help needed moving from lying on your back to sitting on the side of a flat bed without using bedrails?: A  Lot Help needed moving to and from a bed to a chair (including a wheelchair)?: Total Help needed standing up from a chair using your arms (e.g., wheelchair or bedside chair)?: Total Help needed to walk in hospital room?: Total Help needed climbing 3-5 steps with a railing? : Total 6 Click Score: 7    End of Session   Activity Tolerance: Treatment limited secondary to medical complications (Comment);Patient limited by lethargy;Patient limited by fatigue Patient left: in bed;with call bell/phone within reach Nurse Communication: Mobility status;Other (comment)(blood in stool) PT Visit Diagnosis: Other abnormalities of gait and mobility (R26.89);Muscle weakness (generalized) (M62.81)     Time: [id number] PT Time Calculation (min) (ACUTE ONLY): 25 min  Charges:  $Therapeutic Activity: 23-37 mins                     Horald Chestnut, PT    Delford Field 10/26/2019, 1:35 PM

## 2019-10-26 NOTE — Progress Notes (Signed)
PROGRESS NOTE                                                                                                                                                                                                             Patient Demographics:    Grant Lawrence, is a 85 y.o. male, DOB - [birth year], [id number]  Outpatient Primary MD for the patient is [facility]   Admit date - 10/19/2019   LOS - 7  Chief Complaint  Patient presents with  . Dysuria       Brief Narrative: Patient is a 85 y.o. male with PMHx of dementia, chronic diastolic heart failure, complete heart block-s/p permanent pacemaker implantation, permanent atrial fibrillation, HTN, s/p mitral valve annuloplasty-transferred from SNF on 11/10 for evaluation of fever, confusion-further evaluation revealed COVID-19 pneumonia.  See below for further details.   Subjective:    [name] today remains stable-he is not on oxygen-remains pleasantly confused.   Assessment  & Plan :   Acute Hypoxic Resp Failure due to Covid 19 Viral pneumonia: Remains stable-on room air-has completed a course of steroids/remdesivir.   Continue tapering steroids.  Fever: afebrile  O2 requirements: On RA  COVID-19 Labs: Recent Labs    10/24/19 0255 10/25/19 0315  DDIMER 1.87* 2.66*  FERRITIN 419* 420*  CRP 3.5* 1.9*    Lab Results  Component Value Date   SARSCOV2NAA POSITIVE (A) 10/19/2019     COVID-19 Medications: Steroids: 11/11>>11/15 Remdesivir: 11/11>> Actemra: Not given-as patient not significantly hypoxic. Convalescent Plasma: Not given Research Studies:N/A  Other medications: Diuretics:Euvolemic-no need for lasix Antibiotics: No evidence of bacterial pneumonia-stop Rocephin/Zithromax on 11/13.  Prone/Incentive Spirometry: Limited due to dementia  DVT Prophylaxis  : Eliquis  Acute metabolic encephalopathy superimposed on dementia:   encephalopathy likely secondary to COVID-19 pneumonia-superimposed on dementia with delirium.  Suspect encephalopathy has improved-do not think he is that far from his usual baseline.  CAD s/p CABG August 2005 and mitral valve annuloplasty for severe MR: No anginal symptoms-continue supportive care  Permanent atrial fibrillation s/p AV node ablation October 2010-complicated by complete heart block-requiring permanent pacemaker implantation: Rate controlled with Coreg-anticoagulated with Eliquis.    Macrocytosis/thrombocytopenia: Appears to be chronic-we will need outpatient work-up-but suspect further work-up would not change outcome or management given advanced dementia/frailty.  BPH: Continue Flomax  Dementia: Continue Aricept and  Depakote  Failure to thrive syndrome: Very frail-deconditioned-appears to have advanced dementia-seems to be tolerating dysphagia 2 diet.  Appreciate PT/OT eval.    Hard of hearing  ?  UTI: No symptoms-suspect asymptomatic bacteriuria-does not need antimicrobial therapy at this point  ABG: No results found for: PHART, PCO2ART, PO2ART, HCO3, TCO2, ACIDBASEDEF, O2SAT  Vent Settings: N/A  Condition - Guarded  Family Communication  :  Daughter updated over the phone on 11/17  Code Status :  DNR  Diet :  Diet Order            Diet - low sodium heart healthy        DIET DYS 2 Room service appropriate? Yes; Fluid consistency: Nectar Thick  Diet effective now               Disposition Plan  :  Remain hospitalized-hoping to discharge to SNF on 11/18  Barriers to discharge: Insurance authorization before discharge to SNF  Consults  :  None  Procedures  :  None  Antibiotics  :    Anti-infectives (From admission, onward)   Start     Dose/Rate Route Frequency Ordered Stop   10/23/19 1000  remdesivir 100 mg in sodium chloride 0.9 % 250 mL IVPB     100 mg 500 mL/hr over 30 Minutes Intravenous Every 24 hours 10/21/19 2134 10/24/19 1216   10/22/19  1600  remdesivir 100 mg in sodium chloride 0.9 % 250 mL IVPB     100 mg 500 mL/hr over 30 Minutes Intravenous  Once 10/21/19 2134 10/22/19 1748   10/22/19 1000  remdesivir 100 mg in sodium chloride 0.9 % 250 mL IVPB  Status:  Discontinued     100 mg 500 mL/hr over 30 Minutes Intravenous Every 24 hours 10/21/19 1830 10/21/19 2134   10/21/19 2200  remdesivir 100 mg in sodium chloride 0.9 % 250 mL IVPB  Status:  Discontinued     100 mg 500 mL/hr over 30 Minutes Intravenous Every 24 hours 10/20/19 1555 10/21/19 1830   10/21/19 1900  remdesivir 100 mg in sodium chloride 0.9 % 250 mL IVPB     100 mg 500 mL/hr over 30 Minutes Intravenous  Once 10/21/19 1830 10/22/19 0633   10/20/19 1630  remdesivir 200 mg in sodium chloride 0.9 % 250 mL IVPB     200 mg 500 mL/hr over 30 Minutes Intravenous Once 10/20/19 1555 10/20/19 1652   10/19/19 2200  cefTRIAXone (ROCEPHIN) 1 g in sodium chloride 0.9 % 100 mL IVPB  Status:  Discontinued     1 g 200 mL/hr over 30 Minutes Intravenous Every 24 hours 10/19/19 1657 10/22/19 0714   10/19/19 1800  azithromycin (ZITHROMAX) 500 mg in sodium chloride 0.9 % 250 mL IVPB  Status:  Discontinued     500 mg 250 mL/hr over 60 Minutes Intravenous Every 24 hours 10/19/19 1657 10/22/19 0714   10/19/19 1415  ceFEPIme (MAXIPIME) 2 g in sodium chloride 0.9 % 100 mL IVPB     2 g 200 mL/hr over 30 Minutes Intravenous  Once 10/19/19 1412 10/19/19 1544   10/19/19 1415  metroNIDAZOLE (FLAGYL) IVPB 500 mg     500 mg 100 mL/hr over 60 Minutes Intravenous  Once 10/19/19 1412 10/19/19 1707   10/19/19 1415  vancomycin (VANCOCIN) IVPB 1000 mg/200 mL premix  Status:  Discontinued     1,000 mg 200 mL/hr over 60 Minutes Intravenous  Once 10/19/19 1412 10/20/19 0236      Inpatient Medications  Scheduled Meds: . apixaban  5 mg Oral BID  . brimonidine  1 drop Both Eyes TID  . carvedilol  3.125 mg Oral BID  . dexamethasone  6 mg Oral Daily  . divalproex  125 mg Oral BID  . donepezil   10 mg Oral QHS  . folic acid  1 mg Oral Daily  . guaiFENesin  15 mL Oral Q6H  . mirtazapine  15 mg Oral QHS  . multivitamin with minerals  1 tablet Oral Daily  . tamsulosin  0.4 mg Oral QHS  . vitamin C  500 mg Oral Daily  . zinc sulfate  220 mg Oral Daily   Continuous Infusions: . sodium chloride 10 mL/hr at 10/24/19 1416   PRN Meds:.acetaminophen **OR** acetaminophen, albuterol, nystatin, ondansetron **OR** ondansetron (ZOFRAN) IV, phenylephrine-shark liver oil-mineral oil-petrolatum, Resource ThickenUp Clear, Resource ThickenUp Clear, zinc oxide   Time Spent in minutes  25   See all Orders from today for further details   Oren Binet M.D on 10/26/2019 at 11:33 AM  To page go to [email or web address] - use universal password  [facility] -  Office  [phone]    Objective:   Vitals:   10/25/19 1910 10/25/19 2010 10/26/19 0410 10/26/19 0730  BP:  (!) 145/91 127/82 (!) 115/99  Pulse: 73 78 70   Resp:  20 18   Temp:  97.7 F (36.5 C) 98 F (36.7 C) 97.9 F (36.6 C)  TempSrc:  Oral Oral Axillary  SpO2: 97% 97% 99%   Weight:      Height:        Wt Readings from Last 3 Encounters:  10/19/19 63.5 kg  09/01/19 63.1 kg  05/19/19 65.8 kg     Intake/Output Summary (Last 24 hours) at 10/26/2019 1133 Last data filed at 10/26/2019 0900 Gross per 24 hour  Intake 10 ml  Output 1325 ml  Net -1315 ml     Physical Exam Gen Exam: Pleasantly confused-not in any distress HEENT:atraumatic, normocephalic Chest: B/L clear to auscultation anteriorly CVS:S1S2 regular Abdomen:soft non tender, non distended Extremities:no edema Neurology: Non focal Skin: no rash   Data Review:    CBC Recent Labs  Lab 10/19/19 1408  10/22/19 0030 10/23/19 0443 10/24/19 0255 10/25/19 0315 10/26/19 1015  WBC 6.2   < > 5.6 6.1 6.8 8.2 9.7  HGB 14.8   < > 12.0* 13.5 13.9 14.5 15.6  HCT 46.6   < > 37.9* 41.4 42.3 43.0 45.4  PLT 115*   < > 104* 124* 150 182 213  MCV 103.8*   <  > 104.1* 101.2* 99.8 97.7 96.8  MCH 33.0   < > 33.0 33.0 32.8 33.0 33.3  MCHC 31.8   < > 31.7 32.6 32.9 33.7 34.4  RDW 12.8   < > 12.8 12.5 12.3 12.0 12.3  LYMPHSABS 0.5*  --   --   --   --   --   --   MONOABS 0.4  --   --   --   --   --   --   EOSABS 0.2  --   --   --   --   --   --   BASOSABS 0.0  --   --   --   --   --   --    < > = values in this interval not displayed.    Chemistries  Recent Labs  Lab 10/22/19 0030 10/23/19 0443 10/24/19 0255 10/25/19 0315 10/26/19 1015  NA  145 141 139 139 138  K 4.6 4.0 4.7 4.4 4.2  CL 111 104 103 100 99  CO2 26 29 28 30 31   GLUCOSE 128* 120* 103* 96 132*  BUN 20 20 21 21 22   CREATININE 0.63 0.56* 0.55* 0.65 0.74  CALCIUM 9.4 9.4 9.7 9.4 9.6  AST 27 29 38 36 40  ALT 13 19 24 29  37  ALKPHOS 52 54 61 61 67  BILITOT 0.5 0.9 0.7 1.0 1.3*   ------------------------------------------------------------------------------------------------------------------ No results for input(s): CHOL, HDL, LDLCALC, TRIG, CHOLHDL, LDLDIRECT in the last 72 hours.  No results found for: HGBA1C ------------------------------------------------------------------------------------------------------------------ No results for input(s): TSH, T4TOTAL, T3FREE, THYROIDAB in the last 72 hours.  Invalid input(s): FREET3 ------------------------------------------------------------------------------------------------------------------ Recent Labs    10/24/19 0255 10/25/19 0315  FERRITIN 419* 420*    Coagulation profile Recent Labs  Lab 10/19/19 1408  INR 1.2    Recent Labs    10/24/19 0255 10/25/19 0315  DDIMER 1.87* 2.66*    Cardiac Enzymes No results for input(s): CKMB, TROPONINI, MYOGLOBIN in the last 168 hours.  Invalid input(s): CK ------------------------------------------------------------------------------------------------------------------ No results found for: BNP  Micro Results Recent Results (from the past 240 hour(s))  Blood  Culture (routine x 2)     Status: None   Collection Time: 10/19/19  2:09 PM   Specimen: Site Not Specified; Blood  Result Value Ref Range Status   Specimen Description   Final    SITE NOT SPECIFIED Performed at [facility] [address] [place], [place] [address]    Special Requests   Final    BOTTLES DRAWN AEROBIC AND ANAEROBIC Blood Culture results may not be optimal due to an inadequate volume of blood received in culture bottles Performed at [facility] [address], [place], [place] [address]    Culture   Final    NO GROWTH 5 DAYS Performed at [facility], [place] [address] [place], [place] [address]    Report Status 10/24/2019 FINAL  Final  Blood Culture (routine x 2)     Status: None   Collection Time: 10/19/19  2:09 PM   Specimen: Site Not Specified; Blood  Result Value Ref Range Status   Specimen Description   Final    SITE NOT SPECIFIED Performed at [facility] [address] [place], [place] [address]    Special Requests   Final    BOTTLES DRAWN AEROBIC AND ANAEROBIC Blood Culture results may not be optimal due to an inadequate volume of blood received in culture bottles Performed at [facility] [address] [place], [place] [address]    Culture   Final    NO GROWTH 5 DAYS Performed at [facility], [place] [address], [place], [place] [address]    Report Status 10/24/2019 FINAL  Final  Urine culture     Status: Abnormal   Collection Time: 10/19/19  2:09 PM   Specimen: In/Out Cath Urine  Result Value Ref Range Status   Specimen Description   Final    IN/OUT CATH URINE Performed at [facility] [address], [place], [place] [address]    Special Requests   Final    NONE Performed at [facility], [place] [address] [place],  [address]    Culture 60,000 COLONIES/mL STAPHYLOCOCCUS EPIDERMIDIS (A)  Final   Report Status  10/21/2019 FINAL  Final   Organism ID, Bacteria STAPHYLOCOCCUS EPIDERMIDIS (A)  Final  Susceptibility   Staphylococcus epidermidis - MIC*    CIPROFLOXACIN <=0.5 SENSITIVE Sensitive     GENTAMICIN <=0.5 SENSITIVE Sensitive     NITROFURANTOIN <=16 SENSITIVE Sensitive     OXACILLIN RESISTANT Resistant     TETRACYCLINE 2 SENSITIVE Sensitive     VANCOMYCIN 1 SENSITIVE Sensitive     TRIMETH/SULFA <=10 SENSITIVE Sensitive     CLINDAMYCIN <=0.25 SENSITIVE Sensitive     RIFAMPIN <=0.5 SENSITIVE Sensitive     Inducible Clindamycin NEGATIVE Sensitive     * 60,000 COLONIES/mL STAPHYLOCOCCUS EPIDERMIDIS  SARS CORONAVIRUS 2 (TAT 6-24 HRS) Nasopharyngeal Nasopharyngeal Swab     Status: Abnormal   Collection Time: 10/19/19  2:09 PM   Specimen: Nasopharyngeal Swab  Result Value Ref Range Status   SARS Coronavirus 2 POSITIVE (A) NEGATIVE Final    Comment: RESULT CALLED TO, READ BACK BY AND VERIFIED WITH: Gray Bernhardt, RN AT 2206 ON 10/19/2019 BY SAINVILUS S (NOTE) SARS-CoV-2 target nucleic acids are DETECTED. The SARS-CoV-2 RNA is generally detectable in upper and lower respiratory specimens during the acute phase of infection. Positive results are indicative of active infection with SARS-CoV-2. Clinical  correlation with patient history and other diagnostic information is necessary to determine patient infection status. Positive results do  not rule out bacterial infection or co-infection with other viruses. The expected result is Negative. Fact Sheet for Patients: SugarRoll.be Fact Sheet for Healthcare Providers: https://www.woods-mathews.com/ This test is not yet approved or cleared by the [place] FDA and  has been authorized for detection and/or diagnosis of SARS-CoV-2 by FDA under an Emergency Use Authorization (EUA). This EUA will remain  in effect (meaning this test can b e used) for the duration of the COVID-19 declaration under Section  564(b)(1) of the Act, 21 U.S.C. section 360bbb-3(b)(1), unless the authorization is terminated or revoked sooner. Performed at [facility], [place] [address] [place], [place] [address]   MRSA PCR Screening     Status: None   Collection Time: 10/19/19  5:53 PM   Specimen: Nasal Mucosa; Nasopharyngeal  Result Value Ref Range Status   MRSA by PCR NEGATIVE NEGATIVE Final    Comment:        The GeneXpert MRSA Assay (FDA approved for NASAL specimens only), is one component of a comprehensive MRSA colonization surveillance program. It is not intended to diagnose MRSA infection nor to guide or monitor treatment for MRSA infections. Performed at [facility], [place] [address] [place], [place] [address]   Respiratory Panel by PCR     Status: None   Collection Time: 10/20/19  3:13 PM   Specimen: Nasopharyngeal Swab; Respiratory  Result Value Ref Range Status   Adenovirus NOT DETECTED NOT DETECTED Final   Coronavirus 229E NOT DETECTED NOT DETECTED Final    Comment: (NOTE) The Coronavirus on the Respiratory Panel, DOES NOT test for the novel  Coronavirus (2019 nCoV)    Coronavirus HKU1 NOT DETECTED NOT DETECTED Final   Coronavirus NL63 NOT DETECTED NOT DETECTED Final   Coronavirus OC43 NOT DETECTED NOT DETECTED Final   Metapneumovirus NOT DETECTED NOT DETECTED Final   Rhinovirus / Enterovirus NOT DETECTED NOT DETECTED Final   Influenza A NOT DETECTED NOT DETECTED Final   Influenza B NOT DETECTED NOT DETECTED Final   Parainfluenza Virus 1 NOT DETECTED NOT DETECTED Final   Parainfluenza Virus 2 NOT DETECTED NOT DETECTED Final   Parainfluenza Virus 3 NOT DETECTED NOT DETECTED Final   Parainfluenza Virus 4 NOT DETECTED NOT DETECTED  Final   Respiratory Syncytial Virus NOT DETECTED NOT DETECTED Final   Bordetella pertussis NOT DETECTED NOT DETECTED Final   Chlamydophila pneumoniae NOT DETECTED NOT DETECTED Final   Mycoplasma pneumoniae NOT DETECTED NOT DETECTED  Final    Comment: Performed at [facility], [place] [address], [place], [place] [address]    Radiology Reports Dg Chest Port 1 View  Result Date: 10/19/2019 CLINICAL DATA:  Cough, altered mental status EXAM: PORTABLE CHEST 1 VIEW COMPARISON:  01/12/2019 FINDINGS: Left-sided implanted cardiac device with new generator. Leads in unchanged positioning. Median sternotomy with cardiac valve replacement. Stable cardiomegaly. Previously seen right upper lobe and right lower lobe airspace opacities have resolved. There is a hazy left lower lobe airspace opacity, new from prior. No pleural effusion or pneumothorax. IMPRESSION: 1. New hazy airspace opacity in the left lower lobe suspicious for pneumonia. 2. Resolved right upper lobe and right lower lobe airspace opacities. Electronically Signed   By: Davina Poke M.D.   On: 10/19/2019 15:03

## 2019-10-26 NOTE — TOC Progression Note (Signed)
Transition of Care [facility]) - Progression Note    Patient Details  Name: GADGE GRGURICH MRN: [id number] Date of Birth: 1931-08-05  Transition of Care [facility]) CM/SW Contact  Amador Cunas, [place] Phone Number: 10/26/2019, 9:16 AM  Clinical Narrative:   Spoke to [name] in [facility] admissions and confirmed they are able to offer pt a bed beginning tomorrow as no bed available today. Updated pt's dtr, [name] [phone], who has accepted offer from [facility]. Auth remains pending at this time but expect to receive today. Will provide updates as available.   Wandra Feinstein, MSW, LCSW [phone] (GV coverage)       Expected Discharge Plan: [place]    Expected Discharge Plan and Services Expected Discharge Plan: [place] In-house Referral: Clinical Social Work     Living arrangements for the past 2 months: [place] Expected Discharge Date: 10/25/19                                     Social Determinants of Health (SDOH) Interventions    Readmission Risk Interventions No flowsheet data found.

## 2019-10-26 NOTE — Social Work (Addendum)
Spoke to [facility] in [facility] admissions and confirmed they are able to offer pt a bed beginning tomorrow as no bed available today. Updated pt's dtr, [name] [phone], who has accepted offer from [facility].  Confirmed with [facility]) updated PT/OT notes needed for SNF auth. [employer] is not waiving auth requirement due to COVID (+) in [place] at this time. MD and PT aware.   1350: updated PT note and MD progress note sent to [place], MSW, LCSW [phone] (GV coverage)

## 2019-10-27 DIAGNOSIS — I2510 Atherosclerotic heart disease of native coronary artery without angina pectoris: Secondary | ICD-10-CM

## 2019-10-27 DIAGNOSIS — I10 Essential (primary) hypertension: Secondary | ICD-10-CM

## 2019-10-27 LAB — CBC
HCT: 39.8 % (ref 39.0–52.0)
Hemoglobin: 13.8 g/dL (ref 13.0–17.0)
MCH: 33.5 pg (ref 26.0–34.0)
MCHC: 34.7 g/dL (ref 30.0–36.0)
MCV: 96.6 fL (ref 80.0–100.0)
Platelets: 209 10*3/uL (ref 150–400)
RBC: 4.12 MIL/uL — ABNORMAL LOW (ref 4.22–5.81)
RDW: 12.5 % (ref 11.5–15.5)
WBC: 13.1 10*3/uL — ABNORMAL HIGH (ref 4.0–10.5)
nRBC: 0 % (ref 0.0–0.2)

## 2019-10-27 LAB — COMPREHENSIVE METABOLIC PANEL
ALT: 31 U/L (ref 0–44)
AST: 27 U/L (ref 15–41)
Albumin: 2.7 g/dL — ABNORMAL LOW (ref 3.5–5.0)
Alkaline Phosphatase: 59 U/L (ref 38–126)
Anion gap: 9 (ref 5–15)
BUN: 30 mg/dL — ABNORMAL HIGH (ref 8–23)
CO2: 27 mmol/L (ref 22–32)
Calcium: 9.4 mg/dL (ref 8.9–10.3)
Chloride: 102 mmol/L (ref 98–111)
Creatinine, Ser: 0.7 mg/dL (ref 0.61–1.24)
GFR calc Af Amer: >60 mL/min (ref >60)
GFR calc non Af Amer: >60 mL/min (ref >60)
Glucose, Bld: 91 mg/dL (ref 70–99)
Potassium: 4.1 mmol/L (ref 3.5–5.1)
Sodium: 138 mmol/L (ref 135–145)
Total Bilirubin: 0.7 mg/dL (ref 0.3–1.2)
Total Protein: 5.2 g/dL — ABNORMAL LOW (ref 6.5–8.1)

## 2019-10-27 MED ORDER — DEXAMETHASONE 2 MG PO TABS: 2.0000 mg | ORAL_TABLET | Freq: Every day | ORAL | 0 refills | Status: AC

## 2019-10-27 NOTE — TOC Transition Note (Signed)
Transition of Care [facility]) - CM/SW Discharge Note   Patient Details  Name: Grant Lawrence MRN: [id number] Date of Birth: August 14, 1931  Transition of Care [facility]) CM/SW Contact:  Amador Cunas, [place] Phone Number: 10/27/2019, 10:14 AM   Clinical Narrative:   [name] [id number], ref ID [id number]. [name] at [facility] updated and confirmed they are able to admit pt to room 802-A. RN to call report [phone]. Notified pt's dtr, [name], who reports agreeable to d/c. DC summary sent to [facility] and [facility] arranged for transport.     Final next level of care: Skilled Nursing Facility Barriers to Discharge: No Barriers Identified   Patient Goals and CMS Choice Patient states their goals for this hospitalization and ongoing recovery are:: "for dad to return to ALF" CMS Medicare.gov Compare Post Acute Care list provided to:: Other (Comment Required)(Dtr) Choice offered to / list presented to : Adult Children  Discharge Placement PASRR number recieved: 10/25/19            Patient chooses bed at: [facility] Patient to be transferred to facility by: [facility] Name of family member notified: [name] Patient and family notified of of transfer: 10/27/19  Discharge Plan and Services In-house Referral: Clinical Social Work                                   Social Determinants of Health (SDOH) Interventions     Readmission Risk Interventions No flowsheet data found.

## 2019-10-27 NOTE — Social Work (Signed)
[place] auth received: [id number], ref ID [id number]. [name] at [facility] updated and confirmed they are able to admit pt to room 802-A. RN to call report [phone]. Notified pt's dtr, [name], who reports agreeable to d/c. DC summary sent to [facility] and [facility] arranged for transport.   Wandra Feinstein, MSW, [place]

## 2019-10-27 NOTE — Plan of Care (Signed)
  Problem: Education: Goal: Knowledge of General Education information will improve Description: Including pain rating scale, medication(s)/side effects and non-pharmacologic comfort measures 10/27/2019 0936 by Orvan Falconer, RN Outcome: Progressing 10/27/2019 0936 by Orvan Falconer, RN Outcome: Progressing   Problem: Health Behavior/Discharge Planning: Goal: Ability to manage health-related needs will improve 10/27/2019 0936 by Orvan Falconer, RN Outcome: Progressing 10/27/2019 0936 by Orvan Falconer, RN Outcome: Progressing   Problem: Clinical Measurements: Goal: Ability to maintain clinical measurements within normal limits will improve 10/27/2019 0936 by Orvan Falconer, RN Outcome: Progressing 10/27/2019 0936 by Orvan Falconer, RN Outcome: Progressing Goal: Will remain free from infection 10/27/2019 0936 by Orvan Falconer, RN Outcome: Progressing 10/27/2019 0936 by Orvan Falconer, RN Outcome: Progressing Goal: Diagnostic test results will improve 10/27/2019 0936 by Orvan Falconer, RN Outcome: Progressing 10/27/2019 0936 by Orvan Falconer, RN Outcome: Progressing Goal: Respiratory complications will improve 10/27/2019 0936 by Orvan Falconer, RN Outcome: Progressing 10/27/2019 0936 by Orvan Falconer, RN Outcome: Progressing Goal: Cardiovascular complication will be avoided 10/27/2019 0936 by Orvan Falconer, RN Outcome: Progressing 10/27/2019 0936 by Orvan Falconer, RN Outcome: Progressing   Problem: Activity: Goal: Risk for activity intolerance will decrease 10/27/2019 0936 by Orvan Falconer, RN Outcome: Progressing 10/27/2019 0936 by Orvan Falconer, RN Outcome: Progressing   Problem: Nutrition: Goal: Adequate nutrition will be maintained 10/27/2019 0936 by Orvan Falconer, RN Outcome: Progressing 10/27/2019 0936 by Orvan Falconer, RN Outcome: Progressing   Problem: Coping: Goal: Level  of anxiety will decrease 10/27/2019 0936 by Orvan Falconer, RN Outcome: Progressing 10/27/2019 0936 by Orvan Falconer, RN Outcome: Progressing   Problem: Elimination: Goal: Will not experience complications related to bowel motility 10/27/2019 0936 by Orvan Falconer, RN Outcome: Progressing 10/27/2019 0936 by Orvan Falconer, RN Outcome: Progressing Goal: Will not experience complications related to urinary retention 10/27/2019 0936 by Orvan Falconer, RN Outcome: Progressing 10/27/2019 0936 by Orvan Falconer, RN Outcome: Progressing   Problem: Pain Managment: Goal: General experience of comfort will improve 10/27/2019 0936 by Orvan Falconer, RN Outcome: Progressing 10/27/2019 0936 by Orvan Falconer, RN Outcome: Progressing   Problem: Safety: Goal: Ability to remain free from injury will improve 10/27/2019 0936 by Orvan Falconer, RN Outcome: Progressing 10/27/2019 0936 by Orvan Falconer, RN Outcome: Progressing   Problem: Skin Integrity: Goal: Risk for impaired skin integrity will decrease 10/27/2019 0936 by Orvan Falconer, RN Outcome: Progressing 10/27/2019 0936 by Orvan Falconer, RN Outcome: Progressing

## 2019-11-02 ENCOUNTER — Other Ambulatory Visit: Payer: Self-pay

## 2019-11-02 ENCOUNTER — Inpatient Hospital Stay (HOSPITAL_COMMUNITY)
Admission: EM | Admit: 2019-11-02 | Discharge: 2019-11-09 | DRG: 177 | Disposition: E | Payer: Medicare Other | Source: Skilled Nursing Facility | Attending: Internal Medicine | Admitting: Internal Medicine

## 2019-11-02 ENCOUNTER — Encounter (HOSPITAL_COMMUNITY): Payer: Self-pay

## 2019-11-02 ENCOUNTER — Emergency Department (HOSPITAL_COMMUNITY): Payer: Medicare Other

## 2019-11-02 DIAGNOSIS — Z7901 Long term (current) use of anticoagulants: Secondary | ICD-10-CM

## 2019-11-02 DIAGNOSIS — Z515 Encounter for palliative care: Secondary | ICD-10-CM | POA: Diagnosis present

## 2019-11-02 DIAGNOSIS — Z66 Do not resuscitate: Secondary | ICD-10-CM | POA: Diagnosis present

## 2019-11-02 DIAGNOSIS — I5032 Chronic diastolic (congestive) heart failure: Secondary | ICD-10-CM | POA: Diagnosis present

## 2019-11-02 DIAGNOSIS — E870 Hyperosmolality and hypernatremia: Secondary | ICD-10-CM | POA: Diagnosis present

## 2019-11-02 DIAGNOSIS — U071 COVID-19: Principal | ICD-10-CM | POA: Diagnosis present

## 2019-11-02 DIAGNOSIS — N400 Enlarged prostate without lower urinary tract symptoms: Secondary | ICD-10-CM | POA: Diagnosis present

## 2019-11-02 DIAGNOSIS — Z952 Presence of prosthetic heart valve: Secondary | ICD-10-CM | POA: Diagnosis not present

## 2019-11-02 DIAGNOSIS — J9601 Acute respiratory failure with hypoxia: Secondary | ICD-10-CM | POA: Diagnosis present

## 2019-11-02 DIAGNOSIS — R451 Restlessness and agitation: Secondary | ICD-10-CM | POA: Diagnosis present

## 2019-11-02 DIAGNOSIS — E782 Mixed hyperlipidemia: Secondary | ICD-10-CM | POA: Diagnosis present

## 2019-11-02 DIAGNOSIS — F0390 Unspecified dementia without behavioral disturbance: Secondary | ICD-10-CM | POA: Diagnosis present

## 2019-11-02 DIAGNOSIS — I110 Hypertensive heart disease with heart failure: Secondary | ICD-10-CM | POA: Diagnosis present

## 2019-11-02 DIAGNOSIS — I2510 Atherosclerotic heart disease of native coronary artery without angina pectoris: Secondary | ICD-10-CM | POA: Diagnosis present

## 2019-11-02 DIAGNOSIS — I4821 Permanent atrial fibrillation: Secondary | ICD-10-CM | POA: Diagnosis present

## 2019-11-02 DIAGNOSIS — Z95810 Presence of automatic (implantable) cardiac defibrillator: Secondary | ICD-10-CM | POA: Diagnosis not present

## 2019-11-02 DIAGNOSIS — J1289 Other viral pneumonia: Secondary | ICD-10-CM | POA: Diagnosis present

## 2019-11-02 DIAGNOSIS — I959 Hypotension, unspecified: Secondary | ICD-10-CM | POA: Diagnosis present

## 2019-11-02 DIAGNOSIS — Z951 Presence of aortocoronary bypass graft: Secondary | ICD-10-CM | POA: Diagnosis not present

## 2019-11-02 DIAGNOSIS — Z833 Family history of diabetes mellitus: Secondary | ICD-10-CM | POA: Diagnosis not present

## 2019-11-02 DIAGNOSIS — I442 Atrioventricular block, complete: Secondary | ICD-10-CM | POA: Diagnosis present

## 2019-11-02 DIAGNOSIS — J189 Pneumonia, unspecified organism: Secondary | ICD-10-CM | POA: Diagnosis present

## 2019-11-02 LAB — CBC WITH DIFFERENTIAL/PLATELET
Abs Immature Granulocytes: 0.05 10*3/uL (ref 0.00–0.07)
Basophils Absolute: 0 10*3/uL (ref 0.0–0.1)
Basophils Relative: 0 %
Eosinophils Absolute: 0 10*3/uL (ref 0.0–0.5)
Eosinophils Relative: 0 %
HCT: 50.7 % (ref 39.0–52.0)
Hemoglobin: 15.8 g/dL (ref 13.0–17.0)
Immature Granulocytes: 0 %
Lymphocytes Relative: 6 %
Lymphs Abs: 0.7 10*3/uL (ref 0.7–4.0)
MCH: 32.4 pg (ref 26.0–34.0)
MCHC: 31.2 g/dL (ref 30.0–36.0)
MCV: 104.1 fL — ABNORMAL HIGH (ref 80.0–100.0)
Monocytes Absolute: 0.7 10*3/uL (ref 0.1–1.0)
Monocytes Relative: 6 %
Neutro Abs: 10.5 10*3/uL — ABNORMAL HIGH (ref 1.7–7.7)
Neutrophils Relative %: 88 %
Platelets: 192 10*3/uL (ref 150–400)
RBC: 4.87 MIL/uL (ref 4.22–5.81)
RDW: 13 % (ref 11.5–15.5)
WBC: 12 10*3/uL — ABNORMAL HIGH (ref 4.0–10.5)
nRBC: 0 % (ref 0.0–0.2)

## 2019-11-02 LAB — COMPREHENSIVE METABOLIC PANEL
ALT: 17 U/L (ref 0–44)
AST: 27 U/L (ref 15–41)
Albumin: 3 g/dL — ABNORMAL LOW (ref 3.5–5.0)
Alkaline Phosphatase: 81 U/L (ref 38–126)
Anion gap: 12 (ref 5–15)
BUN: 43 mg/dL — ABNORMAL HIGH (ref 8–23)
CO2: 27 mmol/L (ref 22–32)
Calcium: 10.6 mg/dL — ABNORMAL HIGH (ref 8.9–10.3)
Chloride: 111 mmol/L (ref 98–111)
Creatinine, Ser: 0.79 mg/dL (ref 0.61–1.24)
GFR calc Af Amer: >60 mL/min (ref >60)
GFR calc non Af Amer: >60 mL/min (ref >60)
Glucose, Bld: 110 mg/dL — ABNORMAL HIGH (ref 70–99)
Potassium: 4.3 mmol/L (ref 3.5–5.1)
Sodium: 150 mmol/L — ABNORMAL HIGH (ref 135–145)
Total Bilirubin: 1.9 mg/dL — ABNORMAL HIGH (ref 0.3–1.2)
Total Protein: 7.7 g/dL (ref 6.5–8.1)

## 2019-11-02 LAB — URINALYSIS, ROUTINE W REFLEX MICROSCOPIC
Bilirubin Urine: NEGATIVE
Glucose, UA: NEGATIVE mg/dL
Hgb urine dipstick: NEGATIVE
Ketones, ur: 5 mg/dL — AB
Leukocytes,Ua: NEGATIVE
Nitrite: NEGATIVE
Protein, ur: 30 mg/dL — AB
Specific Gravity, Urine: 1.028 (ref 1.005–1.030)
pH: 5 (ref 5.0–8.0)

## 2019-11-02 LAB — TRIGLYCERIDES: Triglycerides: 97 mg/dL (ref <150)

## 2019-11-02 LAB — FIBRINOGEN: Fibrinogen: >800 mg/dL — ABNORMAL HIGH (ref 210–475)

## 2019-11-02 LAB — D-DIMER, QUANTITATIVE: D-Dimer, Quant: 2.57 ug/mL-FEU — ABNORMAL HIGH (ref 0.00–0.50)

## 2019-11-02 LAB — LACTIC ACID, PLASMA
Lactic Acid, Venous: 3.5 mmol/L (ref 0.5–1.9)
Lactic Acid, Venous: 3.6 mmol/L (ref 0.5–1.9)

## 2019-11-02 LAB — FERRITIN: Ferritin: 3033 ng/mL — ABNORMAL HIGH (ref 24–336)

## 2019-11-02 LAB — C-REACTIVE PROTEIN: CRP: 39.7 mg/dL — ABNORMAL HIGH (ref <1.0)

## 2019-11-02 LAB — LACTATE DEHYDROGENASE: LDH: 203 U/L — ABNORMAL HIGH (ref 98–192)

## 2019-11-02 LAB — PROCALCITONIN: Procalcitonin: 0.76 ng/mL

## 2019-11-02 MED ORDER — DEXAMETHASONE SODIUM PHOSPHATE 10 MG/ML IJ SOLN: 6.0000 mg | Freq: Every day | INTRAMUSCULAR | Status: DC

## 2019-11-02 MED ORDER — MORPHINE SULFATE (PF) 2 MG/ML IV SOLN: 2.0000 mg | INTRAVENOUS | Status: DC | PRN

## 2019-11-02 MED ORDER — DIVALPROEX SODIUM 125 MG PO DR TAB
125.0000 mg | DELAYED_RELEASE_TABLET | Freq: Two times a day (BID) | ORAL | Status: DC
  Filled 2019-11-02: qty 1

## 2019-11-02 MED ORDER — DONEPEZIL HCL 5 MG PO TABS: 10.0000 mg | ORAL_TABLET | Freq: Every day | ORAL | Status: DC

## 2019-11-02 MED ORDER — SODIUM CHLORIDE 0.45 % IV SOLN: INTRAVENOUS | Status: DC

## 2019-11-02 MED ORDER — SODIUM CHLORIDE 0.9 % IV SOLN: 100.0000 mg | Freq: Every day | INTRAVENOUS | Status: DC

## 2019-11-02 MED ORDER — APIXABAN 5 MG PO TABS
5.0000 mg | ORAL_TABLET | Freq: Two times a day (BID) | ORAL | Status: DC
  Filled 2019-11-02: qty 1

## 2019-11-02 MED ORDER — MORPHINE SULFATE (PF) 2 MG/ML IV SOLN
2.0000 mg | Freq: Once | INTRAVENOUS | Status: AC
  Administered 2019-11-02: 2 mg via INTRAVENOUS
  Filled 2019-11-02: qty 1

## 2019-11-02 MED ORDER — TOCILIZUMAB 400 MG/20ML IV SOLN: 8.0000 mg/kg | Freq: Once | INTRAVENOUS | Status: DC

## 2019-11-02 MED ORDER — BRIMONIDINE TARTRATE 0.2 % OP SOLN
1.0000 [drp] | Freq: Three times a day (TID) | OPHTHALMIC | Status: DC
  Filled 2019-11-02: qty 5

## 2019-11-02 MED ORDER — SODIUM CHLORIDE 0.9 % IV SOLN
100.0000 mg | Freq: Once | INTRAVENOUS | Status: AC
  Administered 2019-11-02: 100 mg via INTRAVENOUS
  Filled 2019-11-02: qty 100

## 2019-11-02 MED ORDER — GLYCOPYRROLATE 0.2 MG/ML IJ SOLN: 0.1000 mg | INTRAMUSCULAR | Status: DC | PRN

## 2019-11-02 MED ORDER — ACETAMINOPHEN 325 MG PO TABS: 650.0000 mg | ORAL_TABLET | ORAL | Status: DC | PRN

## 2019-11-02 MED ORDER — LORAZEPAM 2 MG/ML IJ SOLN
1.0000 mg | INTRAMUSCULAR | Status: DC | PRN
  Administered 2019-11-03 – 2019-11-05 (×4): 1 mg via INTRAVENOUS
  Filled 2019-11-02 (×4): qty 1

## 2019-11-02 MED ORDER — MORPHINE SULFATE (PF) 2 MG/ML IV SOLN
2.0000 mg | INTRAVENOUS | Status: DC | PRN
  Administered 2019-11-04 – 2019-11-05 (×3): 2 mg via INTRAVENOUS
  Filled 2019-11-02 (×3): qty 1

## 2019-11-02 MED ORDER — ZINC SULFATE 220 (50 ZN) MG PO CAPS: 220.0000 mg | ORAL_CAPSULE | Freq: Every day | ORAL | Status: DC

## 2019-11-02 MED ORDER — SODIUM CHLORIDE 0.9 % IV SOLN: 100.0000 mg | Freq: Once | INTRAVENOUS | Status: DC

## 2019-11-02 MED ORDER — TAMSULOSIN HCL 0.4 MG PO CAPS: 0.4000 mg | ORAL_CAPSULE | Freq: Every day | ORAL | Status: DC

## 2019-11-02 MED ORDER — SODIUM CHLORIDE 0.9 % IV SOLN
2.0000 g | Freq: Once | INTRAVENOUS | Status: AC
  Administered 2019-11-02: 2 g via INTRAVENOUS
  Filled 2019-11-02: qty 2

## 2019-11-02 MED ORDER — SODIUM CHLORIDE 0.9 % IV SOLN: 1.0000 g | Freq: Once | INTRAVENOUS | Status: DC

## 2019-11-02 MED ORDER — MORPHINE SULFATE (CONCENTRATE) 10 MG/0.5ML PO SOLN: 10.0000 mg | ORAL | Status: DC | PRN

## 2019-11-02 MED ORDER — DOCUSATE SODIUM 100 MG PO CAPS: 100.0000 mg | ORAL_CAPSULE | Freq: Two times a day (BID) | ORAL | Status: DC | PRN

## 2019-11-02 MED ORDER — VANCOMYCIN HCL 10 G IV SOLR
1250.0000 mg | Freq: Once | INTRAVENOUS | Status: AC
  Administered 2019-11-02: 18:00:00 1250 mg via INTRAVENOUS
  Filled 2019-11-02: qty 1250

## 2019-11-02 MED ORDER — DEXAMETHASONE SODIUM PHOSPHATE 10 MG/ML IJ SOLN
6.0000 mg | Freq: Once | INTRAMUSCULAR | Status: AC
  Administered 2019-11-02: 6 mg via INTRAVENOUS
  Filled 2019-11-02: qty 1

## 2019-11-02 NOTE — ED Notes (Signed)
Phlebotomy notified need for second set of cultures and repeat lactic

## 2019-11-02 NOTE — Progress Notes (Signed)
Pharmacy: Remdesivir   Patient is a 85 y.o. M recently hospitalized at [facility] for [place].  He received a course of remdesivir (11/11 - 11/15) while receiving care at [facility]. Patient presented to the [facility] ED on 11/24 with c/o SOB and weakness. CXR showed "worsening multilobar bilateral pneumonia." Pharmacy has been consulted to give patient another course of remdesivir.   - ALT: 17    A/P:  - Since patient received recent course of remdesivir, will give 100 mg daily x 5 days (no bolus dose) - Daily CMET while on remdesivir - Will f/u pt's ALT and clinical condition  Dia Sitter, PharmD, BCPS 10/28/2019 6:08 PM

## 2019-11-02 NOTE — ED Notes (Addendum)
Chaplain received call from pt's daughter.    Daughter reports father is catholic and she made promise to him 20 years ago that she would facilitate him receiving [facility] at end of life.  Daughter understands form phone call with MD that pt is end of life.   Daughter reports that a Priest from [facility] is willing to do blessing via phone.    Chaplain consulted with RN around providing rite or blessing with Pt in ER.  Daughter will facilitate with [place].   Chaplain provided support with pt's daughter via phone.  Consulted with administration to facilitate family 15 minute visitation at end of life with Grant Lawrence.

## 2019-11-02 NOTE — ED Notes (Signed)
RT paged for [place]

## 2019-11-02 NOTE — H&P (Addendum)
History and Physical    Grant Lawrence [id number] DOB: 06/22/1931 DOA: 10/15/2019  PCP: [facility]   Patient coming from: SNF    Chief Complaint: Shortness of breath, weakness  HPI: Grant Lawrence is a 85 y.o. male with medical history significant of dementia, complete heart block status post pacemaker, atrial fibrillation, coronary artery disease, chronic diastolic heart failure, hypertension, mitral valve disease status post angioplasty who was sent from [place] facility for worsening respiratory status.  He had known Covid does not infection.  Patient had difficulty breathing and his saturations were low.  They sent him for further evaluation to the emergency department. He was sent from his skilled nursing facility on 11/10 for evaluation of fever, confusion.  He was found to have Covid-19 pneumonia.  He was admitted to [facility].  He was treated with steroids, remdesivir but did not receive Actemra or convalescent plasma.  He improved and was on room air later on, inflammatory markers were trending down so he was discharged back to [facility] on tapering steroids. On presentation today, he was found to have multifocal pneumonia.  He was requiring 100% nonrebreather mask and was saturating in the range of low 90s.  History was limited due to his dementia status.  All information was taken from the daughter.  Patient was found to be lethargic, unable to speak, in respiratory distress.  ED Course: Chest x-ray showed multifocal pneumonia.  Procalcitonin negative.  Blood culture sent.  He was given a dose of IV dexamethasone, IV vancomycin and cefepime.  Discussed with daughter on phone.  Patient is a DNR.  She wants to make her dad comfortable but continue current treatment to give him a chance.  They want blood works, possible antibiotics ,steroids for his Covid pneumonia.  Review of Systems: As per HPI otherwise 10 point review of  systems negative.    Past Medical History:  Diagnosis Date  . A-fib ([place])   . Angina pectoris ([place])   . Asymptomatic microscopic hematuria   . Cardiac defibrillator in place   . Cardiac pacemaker   . CHF (congestive heart failure) ([place])   . Coronary artery disease   . Dementia ([place])   . Hyperlipidemia   . Hypertension   . Long term (current) use of anticoagulants     Past Surgical History:  Procedure Laterality Date  . BIV PACEMAKER GENERATOR CHANGEOUT N/A 05/19/2019   Procedure: BIV PACEMAKER GENERATOR CHANGEOUT;  Surgeon: Sanda Klein, MD;  Location: [facility];  Service: Cardiovascular;  Laterality: N/A;  . CARDIAC DEFIBRILLATOR PLACEMENT  09/26/2009  . CORONARY ARTERY BYPASS GRAFT  2005  . CORONARY ARTERY BYPASS GRAFT    . IMPLANTABLE CARDIOVERTER DEFIBRILLATOR (ICD) GENERATOR CHANGE N/A 08/23/2014   Procedure: ICD GENERATOR CHANGE;  Surgeon: Sanda Klein, MD;  Location: [facility];  Service: Cardiovascular;  Laterality: N/A;  . MITRAL VALVE REPAIR (MV)/CORONARY ARTERY BYPASS GRAFTING (CABG)    . MITRAL VALVULOPLASTY  2005     reports that he has never smoked. He has never used smokeless tobacco. He reports that he does not drink alcohol or use drugs.  Allergies  Allergen Reactions  . Rosuvastatin Calcium Other (See Comments)    Myalgia  . Statins Other (See Comments)    Myalgia    Family History  Problem Relation Age of Onset  . Cancer Father   . Diabetes Brother      Prior to Admission medications   Medication Sig  Start Date End Date Taking? Authorizing Provider  acetaminophen (TYLENOL) 325 MG tablet Take 650 mg by mouth every 4 (four) hours as needed for mild pain. Do not exceed 3g in 24hrs.   Yes [provider]  albuterol (VENTOLIN HFA) 108 (90 Base) MCG/ACT inhaler Inhale 2 puffs into the lungs 2 (two) times daily.   Yes [provider]  apixaban (ELIQUIS) 5 MG TABS tablet Take 1 tablet (5 mg total) by mouth 2 (two) times  daily. 05/20/19  Yes Croitoru, Mihai, MD  Ascorbic Acid (VITAMIN C) 100 MG tablet Take 100 mg by mouth daily.   Yes [provider]  azithromycin (ZITHROMAX) 250 MG tablet Take 250 mg by mouth daily. Ended 11.24.2020   Yes [provider]  brimonidine (ALPHAGAN) 0.2 % ophthalmic solution Place 1 drop into both eyes 3 (three) times daily.   Yes [provider]  carvedilol (COREG) 3.125 MG tablet TAKE ONE TABLET BY MOUTH TWICE DAILY WITH MEALS Patient taking differently: Take 3.125 mg by mouth 2 (two) times daily with a meal.  08/25/17  Yes Marletta Lor, MD  divalproex (DEPAKOTE) 125 MG DR tablet Take 125 mg by mouth 2 (two) times daily.   Yes [provider]  docusate sodium (COLACE) 100 MG capsule Take 100 mg by mouth 2 (two) times daily as needed for mild constipation.   Yes [provider]  donepezil (ARICEPT) 10 MG tablet Take 10 mg by mouth at bedtime.   Yes [provider]  ezetimibe (ZETIA) 10 MG tablet Take 1 tablet (10 mg total) daily by mouth. Patient taking differently: Take 10 mg by mouth at bedtime.  10/24/17  Yes Marletta Lor, MD  finasteride (PROSCAR) 5 MG tablet Take 5 mg by mouth daily.   Yes Festus Aloe, MD  Menthol-Zinc Oxide 0.44-20.6 % OINT Apply 1 application topically daily as needed (rash).   Yes [provider]  mirtazapine (REMERON) 15 MG tablet Take 15 mg by mouth at bedtime.    Yes [provider]  NUTRITIONAL SUPPLEMENT LIQD Take 120 mLs by mouth 3 (three) times daily. House supplement   Yes [provider]  nystatin (NYSTATIN) powder Apply 1 g topically every 6 (six) hours as needed (rash).    Yes [provider]  Phenol-Glycerin (CHLORASEPTIC MAX SORE THROAT MT) Use as directed 2 sprays in the mouth or throat daily as needed (sore throat).   Yes [provider]  Pramox-PE-Glycerin-Petrolatum (HEMORRHOIDAL) 1-0.25-14.4-15 % CREA Apply 1 application  topically daily as needed (hemorrhoids).   Yes [provider]  predniSONE (DELTASONE) 10 MG tablet Take 10 mg by mouth daily with breakfast.   Yes [provider]  tamsulosin (FLOMAX) 0.4 MG CAPS capsule Take 0.4 mg by mouth at bedtime.  03/28/17  Yes Festus Aloe, MD  zinc sulfate 220 (50 Zn) MG capsule Take 220 mg by mouth daily.   Yes [provider]    Physical Exam: Vitals:   11/04/2019 1317 10/18/2019 1354  BP: 130/75   Pulse: 76   Resp: (!) 25   Temp: 98.4 F (36.9 C)   TempSrc: Axillary   SpO2: 90% 93%    Constitutional: NAD, calm, comfortable Vitals:   11/07/2019 1317 10/13/2019 1354  BP: 130/75   Pulse: 76   Resp: (!) 25   Temp: 98.4 F (36.9 C)   TempSrc: Axillary   SpO2: 90% 93%   Eyes: PERRL, lids and conjunctivae normal ENMT: Mucous membranes are moist. Posterior pharynx  clear of any exudate or lesions.Normal dentition.  Neck: normal, supple, no masses, no thyromegaly Respiratory: clear to auscultation bilaterally, no wheezing, no crackles. Normal respiratory effort. No accessory muscle use.  Cardiovascular: Regular rate and rhythm, no murmurs / rubs / gallops. No extremity edema. 2+ pedal pulses. No carotid bruits.  Abdomen: no tenderness, no masses palpated. No hepatosplenomegaly. Bowel sounds positive.  Musculoskeletal: no clubbing / cyanosis. No joint deformity upper and lower extremities. Good ROM, no contractures. Normal muscle tone.  Skin: no rashes, lesions, ulcers. No induration Neurologic: CN 2-12 grossly intact. Sensation intact, DTR normal. Strength 5/5 in all 4.  Psychiatric: Normal judgment and insight. Alert and oriented x 3. Normal mood.   Foley Catheter:None  Labs on Admission: I have personally reviewed following labs and imaging studies  CBC: Recent Labs  Lab 10/27/19 0625 10/25/2019 1308  WBC 13.1* 12.0*  NEUTROABS  --  10.5*  HGB 13.8 15.8  HCT 39.8 50.7  MCV 96.6 104.1*  PLT 209 [id number]   Basic Metabolic  Panel: Recent Labs  Lab 10/27/19 0625 10/20/2019 1308  NA 138 150*  K 4.1 4.3  CL 102 111  CO2 27 27  GLUCOSE 91 110*  BUN 30* 43*  CREATININE 0.70 0.79  CALCIUM 9.4 10.6*   GFR: Estimated Creatinine Clearance: 59.5 mL/min (by C-G formula based on SCr of 0.79 mg/dL). Liver Function Tests: Recent Labs  Lab 10/27/19 0625 10/10/2019 1308  AST 27 27  ALT 31 17  ALKPHOS 59 81  BILITOT 0.7 1.9*  PROT 5.2* 7.7  ALBUMIN 2.7* 3.0*   No results for input(s): LIPASE, AMYLASE in the last 168 hours. No results for input(s): AMMONIA in the last 168 hours. Coagulation Profile: No results for input(s): INR, PROTIME in the last 168 hours. Cardiac Enzymes: No results for input(s): CKTOTAL, CKMB, CKMBINDEX, TROPONINI in the last 168 hours. BNP (last 3 results) No results for input(s): PROBNP in the last 8760 hours. HbA1C: No results for input(s): HGBA1C in the last 72 hours. CBG: No results for input(s): GLUCAP in the last 168 hours. Lipid Profile: Recent Labs    10/31/2019 1308  TRIG 97   Thyroid Function Tests: No results for input(s): TSH, T4TOTAL, FREET4, T3FREE, THYROIDAB in the last 72 hours. Anemia Panel: No results for input(s): VITAMINB12, FOLATE, FERRITIN, TIBC, IRON, RETICCTPCT in the last 72 hours. Urine analysis:    Component Value Date/Time   COLORURINE AMBER (A) 11/07/2019 1426   APPEARANCEUR CLEAR 10/31/2019 1426   LABSPEC 1.028 10/13/2019 1426   PHURINE 5.0 10/31/2019 1426   GLUCOSEU NEGATIVE 10/14/2019 1426   HGBUR NEGATIVE 11/01/2019 1426   BILIRUBINUR NEGATIVE 10/30/2019 1426   BILIRUBINUR n 02/27/2017 1400   KETONESUR 5 (A) 10/19/2019 1426   PROTEINUR 30 (A) 10/20/2019 1426   UROBILINOGEN 0.2 02/27/2017 1400   NITRITE NEGATIVE 10/30/2019 1426   LEUKOCYTESUR NEGATIVE 10/11/2019 1426    Radiological Exams on Admission: Dg Chest Port 1 View  Result Date: 11/08/2019 CLINICAL DATA:  85 year old male with shortness of breath. Positive COVID-19 test. EXAM:  PORTABLE CHEST 1 VIEW COMPARISON:  Chest x-ray 10/19/2019. FINDINGS: Marked worsening aeration in the lungs bilaterally, particularly throughout the left mid to lower lung as well as in the right lower lobe. No definite pleural effusions. No evidence of pulmonary edema. Heart size is mildly enlarged (unchanged). The patient is rotated to the left on today's exam, resulting in distortion of the mediastinal contours and reduced diagnostic sensitivity and specificity for mediastinal pathology. Status post  median sternotomy for mitral valve replacement with mechanical mitral valve. Left-sided biventricular pacemaker/AICD with lead tips projecting over the expected location of the right atrium, right ventricle and overlying the lateral wall the left ventricle via the coronary sinus and coronary veins. IMPRESSION: 1. Worsening multilobar bilateral pneumonia (left greater than right), as above. 2. Mild cardiomegaly. Electronically Signed   By: Vinnie Langton M.D.   On: 10/29/2019 13:48     Assessment/Plan Active Problems:   PNA (pneumonia)   COVID-19   Covid pneumonia: Recently admitted to [facility] and was discharged on 10/25/2019 after found to have fully recovered.  He was in [facility].  Became hypoxic, deveopled difficulty breathing and was sent to the emergency department today.  X-ray in the emergency department showed multifocal pneumonia which is a  new finding. He was treated with steroids and remdesivir while he was hospitalized in [place]. We will continue steroids.  Will restart remdesivir, he just got 5 days of remdesivir last time.  We will  consider Actemra because he is profoundly hypoxic. Continue to monitor inflammatory markers. Procalcitonin was low so will not continue antibiotics.  He was given a dose of vancomycin and cefepime in the emergency department.  Hypernatremia/elevated lactate level: He looks dehydrated.  Will start on gentle IV fluids( 1/2 NS) for next 24 hours.   Check lactic acid and CMP tomorrow  Coronary artery disease: Status post CABG in 2005.  He has history of severe mitral regurgitation and underwent mitral annuloplasty.  Permanent A. fib :Status post AV node ablation in October and then, complicated by complete heart block requiring permanent pacemaker implantation.  Currently rate is controlled.  Takes Coreg which will hold due to relative hypotension.  On anticoagulation with Eliquis which we will continue.  History of diastolic heart failure: Currently looks dehydrated.  Will check BNP.  No peripheral edema.  BPH: Continue Flomax  Dementia: On Aricept and Depakote.  Continue Aricept for now.  Goals of care: History of dementia, advanced age, multiple comorbidities with severe Covid pneumonia.  I discussed with the daughter in detail.  She understands the prognosis.  They are prepared and they want to make him comfortable but wants [place] to continue the treatment for Covid pneumonia.  I have also requested consult for palliative care discussed for goals of care more clearly.  We will continue blood works and steroids for now.  Severity of Illness: The appropriate patient status for this patient is INPATIENT. Inpatient status is judged to be reasonable and necessary in order to provide the required intensity of service to ensure the patient's safety. The patient's presenting symptoms, physical exam findings, and initial radiographic and laboratory data in the context of their chronic comorbidities is felt to place them at high risk for further clinical deterioration. Furthermore, it is not anticipated that the patient will be medically stable for discharge from the hospital within 2 midnights of admission.    DVT prophylaxis: Eliquis Code Status: DNR Family Communication: Discussed with daughter on phone Consults called: palliative care     Shelly Coss MD [facility] Pager [phone]  If 7PM-7AM, please contact night-coverage  [email or web address] Password [facility]  10/12/2019, 4:42 PM

## 2019-11-02 NOTE — ED Provider Notes (Addendum)
[facility] DEPT Provider Note   CSN: [id number] Arrival date & time: 10/18/2019  1231     History   Chief Complaint Chief Complaint  Patient presents with   COVID +   Respiratory Distress    HPI Grant Lawrence is a 85 y.o. male.  Patient from [facility] with concern for worsening respiratory status in setting of known COVID-19.  They reported difficulty breathing, low oxygen saturation.  History limited due to patient's altered mental status.  Level 5 caveat.     HPI  Past Medical History:  Diagnosis Date   A-fib ([place])    Angina pectoris ([place])    Asymptomatic microscopic hematuria    Cardiac defibrillator in place    Cardiac pacemaker    CHF (congestive heart failure) ([place])    Coronary artery disease    Dementia ([place])    Hyperlipidemia    Hypertension    Long term (current) use of anticoagulants     Patient Active Problem List   Diagnosis Date Noted   COVID-19 virus infection    PNA (pneumonia) 10/19/2019   Pacemaker battery depletion 05/19/2019   ICD (implantable cardioverter-defibrillator) battery depletion    FTT (failure to thrive) in adult    Community acquired pneumonia of both lower lobes 01/13/2019   Nausea and vomiting 01/13/2019   Acute respiratory failure with hypoxia ([place]) 01/13/2019   Recurrent aspiration pneumonia ([place]) 08/28/2018   Coronary artery disease involving coronary bypass graft of native heart without angina pectoris 03/06/2018   Hypercholesterolemia 03/06/2018   Dementia ([place]) 09/09/2017   BPH (benign prostatic hyperplasia) 07/22/2016   Elevated PSA 07/22/2016   PVCs (premature ventricular contractions) 07/03/2016   History of repair of mitral valve 07/03/2016   CAD (coronary artery disease)s/p CABG 2005 08/13/2014   Mitral valve insufficiency s/p annuloplasty 2005 08/13/2014   s/p CRT-D [employer] Jude 2010,gen change Sept 2015 08/13/2014   Elective replacement indicated  for implantable cardioverter-defibrillator (ICD) reached July 03, 2014 08/13/2014   HTN (hypertension) 08/13/2014   Mixed hyperlipidemia 08/13/2014   Aortic insufficiency 08/13/2014   Tricuspid insufficiency 08/13/2014   Statin intolerance 08/13/2014   CHB (complete heart block) s/p AV node ablation 08/13/2014   Chronic diastolic heart failure ([place]) 08/13/2014   A-fib ([place]) 08/12/2014    Past Surgical History:  Procedure Laterality Date   BIV PACEMAKER GENERATOR CHANGEOUT N/A 05/19/2019   Procedure: BIV PACEMAKER GENERATOR CHANGEOUT;  Surgeon: Sanda Klein, MD;  Location: [facility];  Service: Cardiovascular;  Laterality: N/A;   CARDIAC DEFIBRILLATOR PLACEMENT  09/26/2009   CORONARY ARTERY BYPASS GRAFT  2005   CORONARY ARTERY BYPASS GRAFT     IMPLANTABLE CARDIOVERTER DEFIBRILLATOR (ICD) GENERATOR CHANGE N/A 08/23/2014   Procedure: ICD GENERATOR CHANGE;  Surgeon: Sanda Klein, MD;  Location: [facility] C[facility];  Service: Cardiovascular;  Laterality: N/A;   MITRAL VALVE REPAIR (MV)/CORONARY ARTERY BYPASS GRAFTING (CABG)     MITRAL VALVULOPLASTY  2005        Home Medications    Prior to Admission medications   Medication Sig Start Date End Date Taking? Authorizing Provider  acetaminophen (TYLENOL) 325 MG tablet Take 650 mg by mouth every 4 (four) hours as needed for mild pain. Do not exceed 3g in 24hrs.   Yes [provider]  albuterol (VENTOLIN HFA) 108 (90 Base) MCG/ACT inhaler Inhale 2 puffs into the lungs 2 (two) times daily.   Yes [provider]  apixaban (ELIQUIS) 5 MG TABS tablet Take 1 tablet (  5 mg total) by mouth 2 (two) times daily. 05/20/19  Yes Croitoru, Mihai, MD  Ascorbic Acid (VITAMIN C) 100 MG tablet Take 100 mg by mouth daily.   Yes [provider]  azithromycin (ZITHROMAX) 250 MG tablet Take 250 mg by mouth daily. Ended 11.24.2020   Yes [provider]  brimonidine (ALPHAGAN) 0.2 % ophthalmic solution Place 1  drop into both eyes 3 (three) times daily.   Yes [provider]  carvedilol (COREG) 3.125 MG tablet TAKE ONE TABLET BY MOUTH TWICE DAILY WITH MEALS Patient taking differently: Take 3.125 mg by mouth 2 (two) times daily with a meal.  08/25/17  Yes Marletta Lor, MD  divalproex (DEPAKOTE) 125 MG DR tablet Take 125 mg by mouth 2 (two) times daily.   Yes [provider]  docusate sodium (COLACE) 100 MG capsule Take 100 mg by mouth 2 (two) times daily as needed for mild constipation.   Yes [provider]  donepezil (ARICEPT) 10 MG tablet Take 10 mg by mouth at bedtime.   Yes [provider]  ezetimibe (ZETIA) 10 MG tablet Take 1 tablet (10 mg total) daily by mouth. Patient taking differently: Take 10 mg by mouth at bedtime.  10/24/17  Yes Marletta Lor, MD  finasteride (PROSCAR) 5 MG tablet Take 5 mg by mouth daily.   Yes Festus Aloe, MD  Menthol-Zinc Oxide 0.44-20.6 % OINT Apply 1 application topically daily as needed (rash).   Yes [provider]  mirtazapine (REMERON) 15 MG tablet Take 15 mg by mouth at bedtime.    Yes [provider]  NUTRITIONAL SUPPLEMENT LIQD Take 120 mLs by mouth 3 (three) times daily. House supplement   Yes [provider]  nystatin (NYSTATIN) powder Apply 1 g topically every 6 (six) hours as needed (rash).    Yes [provider]  Phenol-Glycerin (CHLORASEPTIC MAX SORE THROAT MT) Use as directed 2 sprays in the mouth or throat daily as needed (sore throat).   Yes [provider]  Pramox-PE-Glycerin-Petrolatum (HEMORRHOIDAL) 1-0.25-14.4-15 % CREA Apply 1 application topically daily as needed (hemorrhoids).   Yes [provider]  predniSONE (DELTASONE) 10 MG tablet Take 10 mg by mouth daily with breakfast.   Yes [provider]  tamsulosin (FLOMAX) 0.4 MG CAPS capsule Take 0.4 mg by mouth at bedtime.  03/28/17  Yes Festus Aloe, MD  zinc sulfate 220 (50 Zn)  MG capsule Take 220 mg by mouth daily.   Yes [provider]    Family History Family History  Problem Relation Age of Onset   Cancer Father    Diabetes Brother     Social History Social History   Tobacco Use   Smoking status: Never Smoker   Smokeless tobacco: Never Used  Substance Use Topics   Alcohol use: No   Drug use: No     Allergies   Rosuvastatin calcium and Statins   Review of Systems Review of Systems  Unable to perform ROS: Mental status change     Physical Exam Updated Vital Signs BP 130/75    Pulse 76    Temp 98.4 F (36.9 C) (Axillary)    Resp (!) 25    SpO2 93%   Physical Exam Vitals signs and nursing note reviewed.  Constitutional:      Appearance: He is well-developed.     Comments: Somewhat somnolent, arousable to voice  HENT:     Head: Normocephalic and atraumatic.  Eyes:     Conjunctiva/sclera:  Conjunctivae normal.  Neck:     Musculoskeletal: Neck supple.  Cardiovascular:     Rate and Rhythm: Normal rate and regular rhythm.     Heart sounds: No murmur.  Pulmonary:     Comments: Tachypnea, increased work of breathing, accessory muscle use, coarse breath sounds bilaterally Abdominal:     Palpations: Abdomen is soft.     Tenderness: There is no abdominal tenderness.  Skin:    General: Skin is warm and dry.  Neurological:     Comments: Somewhat lethargic, arouses to voice, no meaningful verbal response, follows commands in all 4 extremities      ED Treatments / Results  Labs (all labs ordered are listed, but only abnormal results are displayed) Labs Reviewed  CBC WITH DIFFERENTIAL/PLATELET - Abnormal; Notable for the following components:      Result Value   WBC 12.0 (*)    MCV 104.1 (*)    Neutro Abs 10.5 (*)    All other components within normal limits  D-DIMER, QUANTITATIVE (NOT AT [facility]) - Abnormal; Notable for the following components:   D-[place], Quant 2.57 (*)    All other components within normal limits    FIBRINOGEN - Abnormal; Notable for the following components:   Fibrinogen >800 (*)    All other components within normal limits  URINALYSIS, ROUTINE W REFLEX MICROSCOPIC - Abnormal; Notable for the following components:   Color, Urine AMBER (*)    Ketones, ur 5 (*)    Protein, ur 30 (*)    Bacteria, UA RARE (*)    All other components within normal limits  CULTURE, BLOOD (ROUTINE X 2)  CULTURE, BLOOD (ROUTINE X 2)  TRIGLYCERIDES  LACTIC ACID, PLASMA  LACTIC ACID, PLASMA  COMPREHENSIVE METABOLIC PANEL  PROCALCITONIN  LACTATE DEHYDROGENASE  FERRITIN  C-REACTIVE PROTEIN    EKG EKG Interpretation  Date/Time:  Tuesday November 02 2019 14:11:35 EST Ventricular Rate:  75 PR Interval:    QRS Duration: 161 QT Interval:  435 QTC Calculation: 486 R Axis:   -93 Text Interpretation: Sinus rhythm Short PR interval Right bundle branch block Inferior infarct, acute Anterolateral infarct, old >>> Acute MI <<< Confirmed by Madalyn Rob [phone]) on 10/18/2019 3:57:15 PM   Radiology Dg Chest Port 1 View  Result Date: 10/21/2019 CLINICAL DATA:  85 year old male with shortness of breath. Positive COVID-19 test. EXAM: PORTABLE CHEST 1 VIEW COMPARISON:  Chest x-ray 10/19/2019. FINDINGS: Marked worsening aeration in the lungs bilaterally, particularly throughout the left mid to lower lung as well as in the right lower lobe. No definite pleural effusions. No evidence of pulmonary edema. Heart size is mildly enlarged (unchanged). The patient is rotated to the left on today's exam, resulting in distortion of the mediastinal contours and reduced diagnostic sensitivity and specificity for mediastinal pathology. Status post median sternotomy for mitral valve replacement with mechanical mitral valve. Left-sided biventricular pacemaker/AICD with lead tips projecting over the expected location of the right atrium, right ventricle and overlying the lateral wall the left ventricle via the coronary sinus and  coronary veins. IMPRESSION: 1. Worsening multilobar bilateral pneumonia (left greater than right), as above. 2. Mild cardiomegaly. Electronically Signed   By: Vinnie Langton M.D.   On: 10/18/2019 13:48    Procedures .Critical Care Performed by: Lucrezia Starch, MD Authorized by: Lucrezia Starch, MD   Critical care provider statement:    Critical care time (minutes):  45   Critical care was necessary to treat or prevent imminent or life-threatening deterioration  of the following conditions:  Respiratory failure   Critical care was time spent personally by me on the following activities:  Discussions with consultants, evaluation of patient's response to treatment, examination of patient, ordering and performing treatments and interventions, ordering and review of laboratory studies, ordering and review of radiographic studies, pulse oximetry, re-evaluation of patient's condition, obtaining history from patient or surrogate and review of old charts   (including critical care time)  Medications Ordered in ED Medications  dexamethasone (DECADRON) injection 6 mg (has no administration in time range)  ceFEPIme (MAXIPIME) 2 g in sodium chloride 0.9 % 100 mL IVPB (has no administration in time range)  vancomycin (VANCOCIN) 1,250 mg in sodium chloride 0.9 % 250 mL IVPB (has no administration in time range)  morphine 2 MG/ML injection 2 mg (has no administration in time range)     Initial Impression / Assessment and Plan / ED Course  I have reviewed the triage vital signs and the nursing notes.  Pertinent labs & imaging results that were available during my care of the patient were reviewed by me and considered in my medical decision making (see chart for details).  Clinical Course as of Nov 02 1611  Tue Nov 02, 2019  1310 Complete initial assessment, on nonrebreather, respiratory distress, poor mental status, will contact family to clarify goals of care   [RD]  26 Long conversation  with daughter, POA, Conveyed concern for very poor prognosis, she request that we continue to give him oxygen therapy, not ready to go comfort care, but affirms DNR/DNI status   [RD]  1317 Updated RN regarding plan, will contact RT to try high flow nasal cannula   [RD]  1417 Rechecked, O2 sats in low 90s, RT at bedside, discussed case with chaplain who is at nursing station who talked to daughter   [RD]  1555 Discussed with hospitalist who will admit   [RD]    Clinical Course User Index [RD] Lucrezia Starch, MD      85 year old male known diagnosis of COVID-19 presents to ER in respiratory distress.  Decreased mental status, significant tachypnea noted on exam.  Discussed case with daughter, POA [name], confirms DNR/DNI but would like other measures performed at this time.  Would consider comfort care but not ready to transition at this time.  Concern for leukocytosis, possible superimposed pneumonia, will start empiric Vanco, cefepime.  Will also start on Decadron.  Discussed case with the hospitalist team who agrees to admit patient.  Final Clinical Impressions(s) / ED Diagnoses   Final diagnoses:  Acute respiratory failure with hypoxia ([place])  COVID-19  Pneumonia of both lungs due to infectious organism, unspecified part of lung    ED Discharge Orders    None       Lucrezia Starch, MD 10/16/2019 1601    Lucrezia Starch, MD 10/13/2019 [phone]

## 2019-11-02 NOTE — Progress Notes (Signed)
NP called by Dr. Hilma Favors from palliative care. Dr. Hilma Favors had spoken to family who want to make pt comfort care only. Dr. Hilma Favors placed appropriate orders for that. Plan per family is they want pt to go to Hospice house in [place], [place]. Admit orders changed and non needed orders d/c'd. Pt had Covid 10 days ago and was at [facility] his sx and confirmed Covid PNA finding, will admit to this hospital to [facility] unit. Will recheck Covid test because he will need negative test to go to Hospice house. He is on isolation until Covid neg.  KJKG, NP [facility]

## 2019-11-02 NOTE — ED Triage Notes (Signed)
Pt arrives from [facility] re[facility] with respiratory distress. Pt is COVID + on 11/18. Pt only responsive to pain

## 2019-11-02 NOTE — ED Notes (Signed)
Pts Grant Lawrence called into pt room. This RN held phone up to pts ear to allow the father to read last rights to pt.

## 2019-11-02 NOTE — Progress Notes (Signed)
Rt called to room WA 25 to set up HF [place]. Pt sats is only 93% on 100% NRB. Pt would not tolerate HF [place] at this time. Rt gave RN HF [place] to use with NRB if sats get in the low 80's.

## 2019-11-02 NOTE — ED Notes (Signed)
MD paged regarding COVID swab

## 2019-11-02 NOTE — Consult Note (Signed)
Palliative Care  Consult Note  85 yo with baseline advanced dementia, cardiac disease and recent COVID19 infection treated and discharged from [facility] on 11/18 admitted with worsening respiratory failure and confusion from SNF. Palliative Care consult requested for goals of care discussion and possible need for terminal care.  I spoke at length with patient's daughter [name] [facility]) and her husband by phone this evening and updated them on the patient's condition-he continues to decline, having agitation, not taking PO medications. I explained the option of allowing for a natural death to occur with comfort and dignity given the progression of his COVID19 and CXR showing inflammation and multifocal PNA. His chance of survival is very low and if he does survive it will not be even close to his baseline-he will also have to endure the delirium and suffering involved with continued aggressive medical interventions such as blood draws, monitoring devices, activity restriction and all of the struggles of being hospitalized for serious illness made much worse by his delirium and dementia.  [name] explained that the doctors who had spoken with her were very guarded and concerned about his ability to survive this but she wanted to know more about what "comfort care" meant- she felt like it meant giving up on her dad. I asked her to consider that in fact it would not be giving up at all but rather an act of courage and compassion to shift to full comfort care. She had appropriate grief and expressed her appreciation for chaplain support in the ED and the opportunity to see her dad because they had been separated for so long while he has been in skilled facilities.  I outlined hospice options and comfort care options and she made the decision to not continue any aggressive medical interventions that could prolong her dad's life in his current condition knowing his chances of survival are low and the devastating impact on his  QOL and functional status even if by a small chance he did recover.  Based on my discussion and the stated goals of care I recommend the following:  Summary of Recommendations:  1. DNR, Comfort Measures Only  2. Only administer medications that contribute to his comfort, no treatment of PNA or other medical conditions that may uncecessarily prolong the dying process.  3. I discontinued PNA treatment and Remdesivir as well as steroids  4. I discontinued all addition labs and monitoring.  5. Maintain Oxygen for comfort but do not titrate for O2 sats but rather his comfort and respiratory rate, give morphine for any signs of increased WOB.  6. Gentle Comfort Feeding if tolerated with aspiration precautions, oral care  7. Avoid physical restraints, will order Sublingual meds in case IV access is lost.  8. Patient's family would like him to go to [facility] if they have a bed available and are able to take him, he will need repeat COVID test, he had a positive test on 11/8 from [facility] but his first positive was on 11/10, now 14 days post COVID dx. I have placed a stat social work consult.  9. He has an ICD this will need to be deactivated.  I discussed the able care plan with his daughter, she will await contact from [facility] re: hospice options, I also prepared her that he is very fragile would need to be stable for transport to go to hospice house.  I discussed this change in care plan with ED RN and also with Tylene Fantasia with [facility]  Hospitalist who will assist in changing the current admission orders.  Lane Hacker, DO Palliative Medicine [phone] (cell)  Time: 70 minutes Greater than 50%  of this time was spent counseling and coordinating care related to the above assessment and plan.

## 2019-11-02 NOTE — ED Notes (Signed)
ED TO INPATIENT HANDOFF REPORT  ED Nurse Name and Phone #:  Anderson Malta [phone]  S Name/Age/Gender [name] 85 y.o. male Room/Bed: WA25/WA25  Code Status   Code Status: DNR  Home/SNF/Other Skilled nursing facility Patient oriented to: self Is this baseline? No   Triage Complete: Triage complete  Chief Complaint respiratory distress covid pos   Triage Note Pt arrives from [facility] and rehab with respiratory distress. Pt is COVID + on 11/18. Pt only responsive to pain    Allergies Allergies  Allergen Reactions  . Rosuvastatin Calcium Other (See Comments)    Myalgia  . Statins Other (See Comments)    Myalgia    Level of Care/Admitting Diagnosis ED Disposition    ED Disposition Condition [facility] Area: [place] [100102]  Level of Care: Med-Surg [16]  Covid Evaluation: Person Under Investigation (PUI)  Diagnosis: COVID-19 [id number]  Admitting Physician: Shelly Coss [id number]  Attending Physician: Shelly Coss [id number]  Estimated length of stay: past midnight tomorrow  Certification:: I certify this patient will need inpatient services for at least 2 midnights  PT Class (Do Not Modify): Inpatient [101]  PT Acc Code (Do Not Modify): Private [1]       B Medical/Surgery History Past Medical History:  Diagnosis Date  . A-fib ([place])   . Angina pectoris ([place])   . Asymptomatic microscopic hematuria   . Cardiac defibrillator in place   . Cardiac pacemaker   . CHF (congestive heart failure) ([place])   . Coronary artery disease   . Dementia ([place])   . Hyperlipidemia   . Hypertension   . Long term (current) use of anticoagulants    Past Surgical History:  Procedure Laterality Date  . BIV PACEMAKER GENERATOR CHANGEOUT N/A 05/19/2019   Procedure: BIV PACEMAKER GENERATOR CHANGEOUT;  Surgeon: Sanda Klein, MD;  Location: [place] CV LAB;  Service: Cardiovascular;  Laterality: N/A;  . CARDIAC DEFIBRILLATOR  PLACEMENT  09/26/2009  . CORONARY ARTERY BYPASS GRAFT  2005  . CORONARY ARTERY BYPASS GRAFT    . IMPLANTABLE CARDIOVERTER DEFIBRILLATOR (ICD) GENERATOR CHANGE N/A 08/23/2014   Procedure: ICD GENERATOR CHANGE;  Surgeon: Sanda Klein, MD;  Location: [place] CATH LAB;  Service: Cardiovascular;  Laterality: N/A;  . MITRAL VALVE REPAIR (MV)/CORONARY ARTERY BYPASS GRAFTING (CABG)    . MITRAL VALVULOPLASTY  2005     A IV Location/Drains/Wounds Patient Lines/Drains/Airways Status   Active Line/Drains/Airways    Name:   Placement date:   Placement time:   Site:   Days:   Peripheral IV 10/19/19 Left Forearm   10/19/19    1405    Forearm   14   Peripheral IV 10/19/19 Left;Anterior Forearm   10/19/19    1800    Forearm   14   Peripheral IV 10/29/2019 Right;Anterior Forearm   10/22/2019    1320    Forearm   less than 1   Peripheral IV 11/04/2019 Left Hand   11/04/2019    -    Hand   less than 1   External Urinary Catheter   10/19/19    1753    -   14          Intake/Output Last 24 hours  Intake/Output Summary (Last 24 hours) at 11/04/2019 2221 Last data filed at 10/28/2019 2026 Gross per 24 hour  Intake 450 ml  Output -  Net 450 ml    Labs/Imaging Results for orders placed or performed during the hospital  encounter of 10/24/2019 (from the past 48 hour(s))  Lactic acid, plasma     Status: Abnormal   Collection Time: 10/31/2019  1:08 PM  Result Value Ref Range   Lactic Acid, Venous 3.5 (HH) 0.5 - 1.9 mmol/L    Comment: CRITICAL RESULT CALLED TO, READ BACK BY AND VERIFIED WITH: C.THOMPSON AT 1645 ON 10/25/2019 BY N.THOMPSON Performed at [facility], [place] [address], [place], [place] [address]   CBC WITH DIFFERENTIAL     Status: Abnormal   Collection Time: 11/07/2019  1:08 PM  Result Value Ref Range   WBC 12.0 (H) 4.0 - 10.5 K/uL   RBC 4.87 4.22 - 5.81 MIL/uL   Hemoglobin 15.8 13.0 - 17.0 g/dL   HCT 50.7 39.0 - 52.0 %   MCV 104.1 (H) 80.0 - 100.0 fL   MCH 32.4 26.0 - 34.0 pg   MCHC  31.2 30.0 - 36.0 g/dL   RDW 13.0 11.5 - 15.5 %   Platelets 192 150 - 400 K/uL   nRBC 0.0 0.0 - 0.2 %   Neutrophils Relative % 88 %   Neutro Abs 10.5 (H) 1.7 - 7.7 K/uL   Lymphocytes Relative 6 %   Lymphs Abs 0.7 0.7 - 4.0 K/uL   Monocytes Relative 6 %   Monocytes Absolute 0.7 0.1 - 1.0 K/uL   Eosinophils Relative 0 %   Eosinophils Absolute 0.0 0.0 - 0.5 K/uL   Basophils Relative 0 %   Basophils Absolute 0.0 0.0 - 0.1 K/uL   WBC Morphology TOXIC GRANULATION     Comment: VACUOLATED NEUTROPHILS   Immature Granulocytes 0 %   Abs Immature Granulocytes 0.05 0.00 - 0.07 K/uL    Comment: Performed at [facility], [place] [address] [place], [place] [address]  Comprehensive metabolic panel     Status: Abnormal   Collection Time: 10/25/2019  1:08 PM  Result Value Ref Range   Sodium 150 (H) 135 - 145 mmol/L   Potassium 4.3 3.5 - 5.1 mmol/L   Chloride 111 98 - 111 mmol/L   CO2 27 22 - 32 mmol/L   Glucose, Bld 110 (H) 70 - 99 mg/dL   BUN 43 (H) 8 - 23 mg/dL   Creatinine, Ser 0.79 0.61 - 1.24 mg/dL   Calcium 10.6 (H) 8.9 - 10.3 mg/dL   Total Protein 7.7 6.5 - 8.1 g/dL   Albumin 3.0 (L) 3.5 - 5.0 g/dL   AST 27 15 - 41 U/L   ALT 17 0 - 44 U/L   Alkaline Phosphatase 81 38 - 126 U/L   Total Bilirubin 1.9 (H) 0.3 - 1.2 mg/dL   GFR calc non Af Amer >60 >60 mL/min   GFR calc Af Amer >60 >60 mL/min   Anion gap 12 5 - 15    Comment: Performed at [facility], [place] [address] [place], [place] [address]  D-dimer, quantitative     Status: Abnormal   Collection Time: 11/08/2019  1:08 PM  Result Value Ref Range   D-Dimer, Quant 2.57 (H) 0.00 - 0.50 ug/mL-FEU    Comment: (NOTE) At the manufacturer cut-off of 0.50 ug/mL FEU, this assay has been documented to exclude PE with a sensitivity and negative predictive value of 97 to 99%.  At this time, this assay has not been approved by the FDA to exclude DVT/VTE. Results should be correlated with clinical  presentation. Performed at [facility], [place] [address], [place], [place] [address]   Procalcitonin  Status: None   Collection Time: 10/16/2019  1:08 PM  Result Value Ref Range   Procalcitonin 0.76 ng/mL    Comment:        Interpretation: PCT > 0.5 ng/mL and <= 2 ng/mL: Systemic infection (sepsis) is possible, but other conditions are known to elevate PCT as well. (NOTE)       Sepsis PCT Algorithm           Lower Respiratory Tract                                      Infection PCT Algorithm    ----------------------------     ----------------------------         PCT < 0.25 ng/mL                PCT < 0.10 ng/mL         Strongly encourage             Strongly discourage   discontinuation of antibiotics    initiation of antibiotics    ----------------------------     -----------------------------       PCT 0.25 - 0.50 ng/mL            PCT 0.10 - 0.25 ng/mL               OR       >80% decrease in PCT            Discourage initiation of                                            antibiotics      Encourage discontinuation           of antibiotics    ----------------------------     -----------------------------         PCT >= 0.50 ng/mL              PCT 0.26 - 0.50 ng/mL                AND       <80% decrease in PCT             Encourage initiation of                                             antibiotics       Encourage continuation           of antibiotics    ----------------------------     -----------------------------        PCT >= 0.50 ng/mL                  PCT > 0.50 ng/mL               AND         increase in PCT                  Strongly encourage                                      initiation  of antibiotics    Strongly encourage escalation           of antibiotics                                     -----------------------------                                           PCT <= 0.25 ng/mL                                                 OR                                         > 80% decrease in PCT                                     Discontinue / Do not initiate                                             antibiotics Performed at [place] [address] [place], [place] [address]   Lactate dehydrogenase     Status: Abnormal   Collection Time: 11/04/2019  1:08 PM  Result Value Ref Range   LDH 203 (H) 98 - 192 U/L    Comment: Performed at [facility], [place] [address], [place], [place] [address]  Ferritin     Status: Abnormal   Collection Time: 10/29/2019  1:08 PM  Result Value Ref Range   Ferritin 3,033 (H) 24 - 336 ng/mL    Comment: Performed at [facility], [place] [address], [place], [place] [address]  Triglycerides     Status: None   Collection Time: 10/29/2019  1:08 PM  Result Value Ref Range   Triglycerides 97 <150 mg/dL    Comment: Performed at [facility], [place] [address] [place], [place] [address]  Fibrinogen     Status: Abnormal   Collection Time: 10/19/2019  1:08 PM  Result Value Ref Range   Fibrinogen >800 (H) 210 - 475 mg/dL    Comment: Performed at [facility], [place] [address], [place], [place] [address]  C-reactive protein     Status: Abnormal   Collection Time: 10/23/2019  1:08 PM  Result Value Ref Range   CRP 39.7 (H) <1.0 mg/dL    Comment: Performed at [facility], [place] [address], [place],  [address]  Urinalysis, Routine w reflex microscopic     Status: Abnormal   Collection Time: 10/17/2019  2:26 PM  Result Value Ref Range   Color, Urine AMBER (A) YELLOW    Comment: BIOCHEMICALS MAY BE AFFECTED BY COLOR   APPearance CLEAR CLEAR   Specific Gravity, Urine 1.028 1.005 - 1.030   pH 5.0 5.0 - 8.0   Glucose, UA NEGATIVE NEGATIVE mg/dL  Hgb urine dipstick NEGATIVE NEGATIVE   Bilirubin Urine NEGATIVE NEGATIVE   Ketones, ur 5 (A) NEGATIVE mg/dL   Protein, ur 30 (A) NEGATIVE mg/dL   Nitrite  NEGATIVE NEGATIVE   Leukocytes,Ua NEGATIVE NEGATIVE   RBC / HPF 11-20 0 - 5 RBC/hpf   WBC, UA 0-5 0 - 5 WBC/hpf   Bacteria, UA RARE (A) NONE SEEN   Squamous Epithelial / LPF 0-5 0 - 5   Mucus PRESENT    Hyaline Casts, UA PRESENT     Comment: Performed at [facility], [place] [address] [place], [place] [address]  Lactic acid, plasma     Status: Abnormal   Collection Time: 10/11/2019  5:04 PM  Result Value Ref Range   Lactic Acid, Venous 3.6 (HH) 0.5 - 1.9 mmol/L    Comment: CRITICAL VALUE NOTED.  VALUE IS CONSISTENT WITH PREVIOUSLY REPORTED AND CALLED VALUE. Performed at [facility], [place] [address] [place], [place] [address]    Dg Chest Port 1 View  Result Date: 10/17/2019 CLINICAL DATA:  85 year old male with shortness of breath. Positive COVID-19 test. EXAM: PORTABLE CHEST 1 VIEW COMPARISON:  Chest x-ray 10/19/2019. FINDINGS: Marked worsening aeration in the lungs bilaterally, particularly throughout the left mid to lower lung as well as in the right lower lobe. No definite pleural effusions. No evidence of pulmonary edema. Heart size is mildly enlarged (unchanged). The patient is rotated to the left on today's exam, resulting in distortion of the mediastinal contours and reduced diagnostic sensitivity and specificity for mediastinal pathology. Status post median sternotomy for mitral valve replacement with mechanical mitral valve. Left-sided biventricular pacemaker/AICD with lead tips projecting over the expected location of the right atrium, right ventricle and overlying the lateral wall the left ventricle via the coronary sinus and coronary veins. IMPRESSION: 1. Worsening multilobar bilateral pneumonia (left greater than right), as above. 2. Mild cardiomegaly. Electronically Signed   By: Vinnie Langton M.D.   On: 10/12/2019 13:48    Pending Labs Unresulted Labs (From admission, onward)   None      Vitals/Pain Today's Vitals   10/14/2019 2000  10/24/2019 2030 11/01/2019 2100 10/27/2019 2130  BP: (!) 145/67 136/66 (!) 110/97 128/60  Pulse: 74 61 73 70  Resp: (!) 22 19 19 18   Temp:      TempSrc:      SpO2: 97% 99% 100% 99%    Isolation Precautions Airborne and Contact precautions  Medications Medications  LORazepam (ATIVAN) injection 1 mg (has no administration in time range)  morphine 2 MG/ML injection 2 mg (has no administration in time range)  glycopyrrolate (ROBINUL) injection 0.1 mg (has no administration in time range)  morphine CONCENTRATE 10 MG/0.5ML oral solution 10 mg (has no administration in time range)  dexamethasone (DECADRON) injection 6 mg (6 mg Intravenous Given 10/13/2019 1738)  ceFEPIme (MAXIPIME) 2 g in sodium chloride 0.9 % 100 mL IVPB (0 g Intravenous Stopped 10/21/2019 1948)  vancomycin (VANCOCIN) 1,250 mg in sodium chloride 0.9 % 250 mL IVPB (0 mg Intravenous Stopped 10/13/2019 1947)  morphine 2 MG/ML injection 2 mg (2 mg Intravenous Given 10/31/2019 1737)  remdesivir 100 mg in sodium chloride 0.9 % 250 mL IVPB (0 mg Intravenous Stopped 10/11/2019 2026)    Mobility non-ambulatory High fall risk   Focused Assessments Pulmonary Assessment Handoff:  Lung sounds:   O2 Device: NRB O2 Flow Rate (L/min): 15 L/min      R Recommendations: See Admitting Provider Note  Report given to:  Sharyn Lull [phone]

## 2019-11-02 NOTE — ED Notes (Signed)
Unable to get both sets of cultures on pt. MD aware.

## 2019-11-02 NOTE — Progress Notes (Signed)
A consult was received from an ED physician for vancomycin  per pharmacy dosing.  The patient's profile has been reviewed for ht/wt/allergies/indication/available labs.    A one time order has been placed for vancomycin 1250 mg IV x1 .  Further antibiotics/pharmacy consults should be ordered by admitting physician if indicated.                       Thank you, Lynelle Doctor 10/15/2019  3:52 PM

## 2019-11-02 NOTE — ED Notes (Signed)
Provided support with Mr Wrobleski daughters on their arrival.  Present during MD conversation and support with daughters around visitation with Mr Markunas.    Thanks to care team for compassionate presence with this family.    Jerene Pitch, MDiv, [facility]  Lead Clinical Chaplain  [facility] and [place]

## 2019-11-02 NOTE — ED Notes (Signed)
Called to give floor report, RN unavailable to take report, will call back.

## 2019-11-03 ENCOUNTER — Other Ambulatory Visit: Payer: Self-pay

## 2019-11-03 DIAGNOSIS — J9601 Acute respiratory failure with hypoxia: Secondary | ICD-10-CM | POA: Diagnosis not present

## 2019-11-03 LAB — SARS CORONAVIRUS 2 (TAT 6-24 HRS): SARS Coronavirus 2: POSITIVE — AB

## 2019-11-03 NOTE — Progress Notes (Signed)
Pt has been resting in bed since arrival on unit. Respirations are even and unlabored. Pt does not appear to be in any acute distress. Responds to verbal stimuli. Will continue to monitor.

## 2019-11-03 NOTE — TOC Transition Note (Signed)
[facility] - CM/SW Discharge Note   Patient Details  Name: Grant Lawrence MRN: [id number] Date of Birth: February 18, 1931  Transition of Care [facility]) CM/SW Contact:  Purcell Mouton, RN Phone Number: 11/03/2019, 1:14 PM   Clinical Narrative:    Palliative Care to see pt. Will continue to follow for discharge plan.       Barriers to Discharge: No Barriers Identified   Patient Goals and CMS Choice     Choice offered to / list presented to : Adult Children  Discharge Placement                       Discharge Plan and Services                                     Social Determinants of Health (SDOH) Interventions     Readmission Risk Interventions No flowsheet data found.

## 2019-11-03 NOTE — Progress Notes (Signed)
Patient results called from [place] at [facility], [place] (+). Provider updated in person.

## 2019-11-03 NOTE — Progress Notes (Addendum)
Palliative Medicine RN Note: Rec'd call from Dr Hilma Favors. She has been in contact with JoAnn with [facility], who will reach out to family. Pt would need to go to [place] yesterday. Updated TOC RN Cookie McGibboney that Dr Hilma Favors has called the hospice provider.  Marjie Skiff Omesha Bowerman, RN, BSN, [facility] Palliative Medicine Team 11/03/2019 2:20 PM Office [phone]

## 2019-11-03 NOTE — Progress Notes (Addendum)
PROGRESS NOTE                                                                                                                                                                                                             Patient Demographics:    Grant Lawrence, is a 85 y.o. male, DOB - 08-Apr-1931, ZA:[id number]  Admit date - 10/29/2019   Admitting Physician Shelly Coss, MD  Outpatient Primary MD for the patient is [facility]  LOS - 1   Chief Complaint  Patient presents with   COVID POS   Respiratory Distress       Brief Narrative  85 year old male with dementia, complete heart block status post pacemaker placement, A. fib, CAD, chronic diastolic CHF, mitral valve disease status with angioplasty symptom commonplace SNF with worsening respiratory symptoms.  He recently was hospitalized 2 weeks back for fever and confusion and found to have COVID-19 pneumonia and managed at [facility] and transferred back to SNF after treated with remdesivir and steroid. On presentation he was found to have acute hypoxic respiratory failure secondary to multifocal pneumonia and required 100% nonrebreather.  After discussion with daughter regarding patient's poor functional status and declining health patient was made comfort care.    Subjective:   Patient monitored outside the room and exam findings discussed with his nurse.  He has been resting comfortably on morphine drip.    Assessment  & Plan :   Acute respiratory failure with hypoxemia Multifocal pneumonia Now goal for comfort.  Palliative care consult appreciated.  Discontinue antibiotics, no additional labs.  Oxygen for comfort, morphine as needed for shortness of breath or pain. Family interested in patient went to [facility] if bed available.  Repeat Covid testing is positive.      Code Status : DNR, comfort measures  Family  Communication  : spoke with daughter on the phone. Spoke with hospice RN ( Joann Nicki Reaper) all COVID designated beds at [facility] is full. Additional wing will be expanded next Monday but may have current beds opend up on Friday. Will follow up with her again.  Disposition Plan  : Hospice home versus in hospital death.  Consults  : Palliative care  Lab Results  Component Value Date   PLT 192 10/26/2019    Antibiotics  :  Anti-infectives (From  admission, onward)   Start     Dose/Rate Route Frequency Ordered Stop   11/04/19 1000  remdesivir 100 mg in sodium chloride 0.9 % 250 mL IVPB  Status:  Discontinued     100 mg 500 mL/hr over 30 Minutes Intravenous Daily 11/04/2019 1813 11/03/2019 2031   11/03/19 1600  remdesivir 100 mg in sodium chloride 0.9 % 250 mL IVPB  Status:  Discontinued     100 mg 500 mL/hr over 30 Minutes Intravenous  Once 11/07/2019 1813 11/07/2019 2031   10/24/2019 1815  remdesivir 100 mg in sodium chloride 0.9 % 250 mL IVPB     100 mg 500 mL/hr over 30 Minutes Intravenous  Once 10/27/2019 1811 10/23/2019 2026   10/29/2019 1600  vancomycin (VANCOCIN) 1,250 mg in sodium chloride 0.9 % 250 mL IVPB     1,250 mg 166.7 mL/hr over 90 Minutes Intravenous  Once 10/17/2019 1554 10/30/2019 1947   10/11/2019 1545  ceFEPIme (MAXIPIME) 1 g in sodium chloride 0.9 % 100 mL IVPB  Status:  Discontinued     1 g 200 mL/hr over 30 Minutes Intravenous  Once 10/10/2019 1535 10/17/2019 1541   10/17/2019 1545  ceFEPIme (MAXIPIME) 2 g in sodium chloride 0.9 % 100 mL IVPB     2 g 200 mL/hr over 30 Minutes Intravenous  Once 11/07/2019 1541 10/16/2019 1948        Objective:   Vitals:   10/13/2019 2130 10/10/2019 2200 10/11/2019 2231 11/03/19 0007  BP: 128/60 124/70 (!) 156/72   Pulse: 70 71 68   Resp: 18 19 (!) 22   Temp:      TempSrc:      SpO2: 99% 100% 100%   Weight:    55.9 kg  Height:    6' (1.829 m)    Wt Readings from Last 3 Encounters:  11/03/19 55.9 kg  10/19/19 63.5 kg  09/01/19 63.1 kg      Intake/Output Summary (Last 24 hours) at 11/03/2019 1450 Last data filed at 10/24/2019 2026 Gross per 24 hour  Intake 450 ml  Output --  Net 450 ml     Physical Exam  Patient monitored peripherally given his active COVID-19 status and being on comfort measures.    Data Review:    CBC Recent Labs  Lab 10/12/2019 1308  WBC 12.0*  HGB 15.8  HCT 50.7  PLT 192  MCV 104.1*  MCH 32.4  MCHC 31.2  RDW 13.0  LYMPHSABS 0.7  MONOABS 0.7  EOSABS 0.0  BASOSABS 0.0    Chemistries  Recent Labs  Lab 10/20/2019 1308  NA 150*  K 4.3  CL 111  CO2 27  GLUCOSE 110*  BUN 43*  CREATININE 0.79  CALCIUM 10.6*  AST 27  ALT 17  ALKPHOS 81  BILITOT 1.9*   ------------------------------------------------------------------------------------------------------------------ Recent Labs    10/17/2019 1308  TRIG 97    No results found for: HGBA1C ------------------------------------------------------------------------------------------------------------------ No results for input(s): TSH, T4TOTAL, T3FREE, THYROIDAB in the last 72 hours.  Invalid input(s): FREET3 ------------------------------------------------------------------------------------------------------------------ Recent Labs    11/08/2019 1308  FERRITIN 3,033*    Coagulation profile No results for input(s): INR, PROTIME in the last 168 hours.  Recent Labs    10/17/2019 1308  DDIMER 2.57*    Cardiac Enzymes No results for input(s): CKMB, TROPONINI, MYOGLOBIN in the last 168 hours.  Invalid input(s): CK ------------------------------------------------------------------------------------------------------------------ No results found for: BNP  Inpatient Medications  Scheduled Meds: Continuous Infusions: PRN Meds:.glycopyrrolate, LORazepam, morphine injection, morphine CONCENTRATE  Micro  Results Recent Results (from the past 240 hour(s))  Blood Culture (routine x 2)     Status: None (Preliminary result)    Collection Time: 10/21/2019  1:43 PM   Specimen: Site Not Specified; Blood  Result Value Ref Range Status   Specimen Description   Final    SITE NOT SPECIFIED Performed at [facility] [address] [place], [place] [address]    Special Requests   Final    BOTTLES DRAWN AEROBIC AND ANAEROBIC Blood Culture adequate volume Performed at [facility] [address], [place], [place] [address]    Culture   Final    NO GROWTH < 24 HOURS Performed at [facility] [address], [place], [place] [address]    Report Status PENDING  Incomplete  Blood Culture (routine x 2)     Status: None (Preliminary result)   Collection Time: 10/22/2019  5:02 PM   Specimen: BLOOD RIGHT HAND  Result Value Ref Range Status   Specimen Description   Final    BLOOD RIGHT HAND Performed at [facility] [address], [place], [place] [address]    Special Requests   Final    BOTTLES DRAWN AEROBIC ONLY Blood Culture results may not be optimal due to an inadequate volume of blood received in culture bottles Performed at [facility] [address] [place], [place] [address]    Culture   Final    NO GROWTH < 24 HOURS Performed at [facility] [address], [place], [place] [address]    Report Status PENDING  Incomplete  SARS CORONAVIRUS 2 (TAT 6-24 HRS) Nasopharyngeal Nasopharyngeal Swab     Status: Abnormal   Collection Time: 11/01/2019 10:31 PM   Specimen: Nasopharyngeal Swab  Result Value Ref Range Status   SARS Coronavirus 2 POSITIVE (A) NEGATIVE Final    Comment: RESULT CALLED TO, READ BACK BY AND VERIFIED WITH: Marrian Salvage RN 13:05 11/03/19 ([email or web address]) (NOTE) SARS-CoV-2 target nucleic acids are DETECTED. The SARS-CoV-2 RNA is generally detectable in upper and lower respiratory specimens during the acute phase of infection. Positive results are indicative of the presence of SARS-CoV-2 RNA.  Clinical correlation with patient history and other diagnostic information is  necessary to determine patient infection status. Positive results do not rule out bacterial infection or co-infection with other viruses.  The expected result is Negative. Fact Sheet for Patients: SugarRoll.be Fact Sheet for Healthcare Providers: https://www.woods-mathews.com/ This test is not yet approved or cleared by the [place] FDA and  has been authorized for detection and/or diagnosis of SARS-CoV-2 by FDA under an Emergency Use Authorization (EUA). This EUA will remain  in effect (meaning this test can be used) for t he duration of the COVID-19 declaration under Section 564(b)(1) of the Act, 21 U.S.C. section 360bbb-3(b)(1), unless the authorization is terminated or revoked sooner. Performed at [facility], [place] [address] [place], [place] [address]     Radiology Reports Dg Chest [place] 1 View  Result Date: 10/19/2019 CLINICAL DATA:  85 year old male with shortness of breath. Positive COVID-19 test. EXAM: PORTABLE CHEST 1 VIEW COMPARISON:  Chest x-ray 10/19/2019. FINDINGS: Marked worsening aeration in the lungs bilaterally, particularly throughout the left mid to lower lung as well as in the right lower lobe. No definite pleural effusions. No evidence of pulmonary edema. Heart size is mildly enlarged (unchanged). The patient is rotated to the left on today's exam, resulting in distortion of  the mediastinal contours and reduced diagnostic sensitivity and specificity for mediastinal pathology. Status post median sternotomy for mitral valve replacement with mechanical mitral valve. Left-sided biventricular pacemaker/AICD with lead tips projecting over the expected location of the right atrium, right ventricle and overlying the lateral wall the left ventricle via the coronary sinus and coronary veins. IMPRESSION: 1. Worsening multilobar bilateral pneumonia  (left greater than right), as above. 2. Mild cardiomegaly. Electronically Signed   By: Vinnie Langton M.D.   On: 11/04/2019 13:48   Dg Chest Port 1 View  Result Date: 10/19/2019 CLINICAL DATA:  Cough, altered mental status EXAM: PORTABLE CHEST 1 VIEW COMPARISON:  01/12/2019 FINDINGS: Left-sided implanted cardiac device with new generator. Leads in unchanged positioning. Median sternotomy with cardiac valve replacement. Stable cardiomegaly. Previously seen right upper lobe and right lower lobe airspace opacities have resolved. There is a hazy left lower lobe airspace opacity, new from prior. No pleural effusion or pneumothorax. IMPRESSION: 1. New hazy airspace opacity in the left lower lobe suspicious for pneumonia. 2. Resolved right upper lobe and right lower lobe airspace opacities. Electronically Signed   By: Davina Poke M.D.   On: 10/19/2019 15:03    Time Spent in minutes  15   Donelda Mailhot M.D on 11/03/2019 at 2:50 PM  Between 7am to 7pm - Pager - [phone]  After 7pm go to [email or web address] - password [facility]  [facility] -  Office  [phone]

## 2019-11-03 NOTE — Progress Notes (Signed)
Patient continues to rest well and responds to verbal stimuli. Respirations are even and unlabored. Will continue to monitor.

## 2019-11-03 NOTE — TOC Progression Note (Signed)
Transition of Care [facility]) - Progression Note    Patient Details  Name: Grant Lawrence MRN: [id number] Date of Birth: June 28, 1931  [facility] CM/SW Contact  Purcell Mouton, RN Phone Number: 11/03/2019, 3:51 PM  Clinical Narrative:    There are no COVID residential hospice beds available at this time.   Expected Discharge Plan: Skilled Nursing Facility Barriers to Discharge: No Barriers Identified  Expected Discharge Plan and Services Expected Discharge Plan: [facility]                                               Social Determinants of Health (SDOH) Interventions    Readmission Risk Interventions No flowsheet data found.

## 2019-11-03 NOTE — Progress Notes (Signed)
Call placed to patients daughter [name] [phone]. Questions were concerning patients comfort and discharge plan. Pt is resting well and responds to verbal stimuli. Respirations are even and unlabored. Will continue to monitor.

## 2019-11-03 NOTE — Consult Note (Signed)
[facility] referral from [facility] after having conversations with both Dr Hilma Favors and [facility] with Palliative Care.  Spoke with Dtr [name] to introduce myself and give general info on Hospice services.  Emotional support provided.  At the time of this call, [name] is unaware of the results of the Covid test today.  Her first choice is placement of pt at [facility] but understands that a Covid + result would dictate pt be placed at our [facility].  Currently all Covid designated beds at the [facility] are full.  Will continue to follow case and notify when a Covid bed is available.  Thank you for this referral. Wynetta Fines, RN [phone]

## 2019-11-04 DIAGNOSIS — J9601 Acute respiratory failure with hypoxia: Secondary | ICD-10-CM | POA: Diagnosis not present

## 2019-11-04 DIAGNOSIS — U071 COVID-19: Secondary | ICD-10-CM | POA: Diagnosis not present

## 2019-11-04 NOTE — Progress Notes (Signed)
Spoke with Yetta Glassman from [place]. At this time will not discharge patient due to patient is actively dying and in [place] stokes breathing. Dr. Clementeen Graham made aware and patient's daughter, [name].  Also spoke with [employer] representative, Lorella Nimrod, who informed RN back in September had ICD turned off and is just a pacemaker. Dr. Clementeen Graham made aware.

## 2019-11-04 NOTE — Progress Notes (Signed)
PROGRESS NOTE                                                                                                                                                                                                             Patient Demographics:    Grant Lawrence, is a 85 y.o. male, DOB - 12-16-1930, ZA:[id number]  Admit date - 10/17/2019   Admitting Physician Shelly Coss, MD  Outpatient Primary MD for the patient is [facility]  LOS - 2   Chief Complaint  Patient presents with  . COVID POS  . Respiratory Distress       Brief Narrative  85 year old male with dementia, complete heart block status post pacemaker placement, A. fib, CAD, chronic diastolic CHF, mitral valve disease status with angioplasty symptom commonplace SNF with worsening respiratory symptoms.  He recently was hospitalized 2 weeks back for fever and confusion and found to have COVID-19 pneumonia and managed at [facility] and transferred back to SNF after treated with remdesivir and steroid. On presentation he was found to have acute hypoxic respiratory failure secondary to multifocal pneumonia and required 100% nonrebreather.  After discussion with daughter regarding patient's poor functional status and declining health patient was made comfort care.    Subjective:   Patient received some Ativan this morning for restlessness and has been comfortable since then.  No other overnight events.  Assessment  & Plan :   Acute respiratory failure with hypoxemia Multifocal pneumonia On comfort measures only.  Palliative care consult appreciated.  Off antibiotics.  No blood draws and schedule vitals. Oxygen for comfort, morphine as needed for shortness of breath or pain. Family interested in patient went to [facility] if bed available.  Repeat Covid testing is positive.      Code Status : DNR, comfort measures  Family  Communication: will update daughter on the phone.  Disposition Plan  : Awaiting hospice home at [place]  : Palliative care  Lab Results  Component Value Date   PLT 192 10/11/2019    Antibiotics  :  Anti-infectives (From admission, onward)   Start     Dose/Rate Route Frequency Ordered Stop   11/04/19 1000  remdesivir 100 mg in sodium chloride 0.9 % 250 mL IVPB  Status:  Discontinued     100 mg 500 mL/hr  over 30 Minutes Intravenous Daily 10/27/2019 1813 10/26/2019 2031   11/03/19 1600  remdesivir 100 mg in sodium chloride 0.9 % 250 mL IVPB  Status:  Discontinued     100 mg 500 mL/hr over 30 Minutes Intravenous  Once 10/13/2019 1813 10/16/2019 2031   10/20/2019 1815  remdesivir 100 mg in sodium chloride 0.9 % 250 mL IVPB     100 mg 500 mL/hr over 30 Minutes Intravenous  Once 10/10/2019 1811 10/25/2019 2026   10/31/2019 1600  vancomycin (VANCOCIN) 1,250 mg in sodium chloride 0.9 % 250 mL IVPB     1,250 mg 166.7 mL/hr over 90 Minutes Intravenous  Once 10/25/2019 1554 10/29/2019 1947   11/04/2019 1545  ceFEPIme (MAXIPIME) 1 g in sodium chloride 0.9 % 100 mL IVPB  Status:  Discontinued     1 g 200 mL/hr over 30 Minutes Intravenous  Once 11/04/2019 1535 11/01/2019 1541   10/20/2019 1545  ceFEPIme (MAXIPIME) 2 g in sodium chloride 0.9 % 100 mL IVPB     2 g 200 mL/hr over 30 Minutes Intravenous  Once 11/01/2019 1541 10/17/2019 1948        Objective:   Vitals:   10/10/2019 2130 10/14/2019 2200 10/27/2019 2231 11/03/19 0007  BP: 128/60 124/70 (!) 156/72   Pulse: 70 71 68   Resp: 18 19 (!) 22   Temp:      TempSrc:      SpO2: 99% 100% 100%   Weight:    55.9 kg  Height:    6' (1.829 m)    Wt Readings from Last 3 Encounters:  11/03/19 55.9 kg  10/19/19 63.5 kg  09/01/19 63.1 kg     Intake/Output Summary (Last 24 hours) at 11/04/2019 1055 Last data filed at 11/04/2019 0600 Gross per 24 hour  Intake 0 ml  Output 800 ml  Net -800 ml     Physical Exam  Patient monitored peripherally given active  COVID-19 infection and comfort measure status.    Data Review:    CBC Recent Labs  Lab 10/27/2019 1308  WBC 12.0*  HGB 15.8  HCT 50.7  PLT 192  MCV 104.1*  MCH 32.4  MCHC 31.2  RDW 13.0  LYMPHSABS 0.7  MONOABS 0.7  EOSABS 0.0  BASOSABS 0.0    Chemistries  Recent Labs  Lab 10/22/2019 1308  NA 150*  K 4.3  CL 111  CO2 27  GLUCOSE 110*  BUN 43*  CREATININE 0.79  CALCIUM 10.6*  AST 27  ALT 17  ALKPHOS 81  BILITOT 1.9*   ------------------------------------------------------------------------------------------------------------------ Recent Labs    10/27/2019 1308  TRIG 97    No results found for: HGBA1C ------------------------------------------------------------------------------------------------------------------ No results for input(s): TSH, T4TOTAL, T3FREE, THYROIDAB in the last 72 hours.  Invalid input(s): FREET3 ------------------------------------------------------------------------------------------------------------------ Recent Labs    10/12/2019 1308  FERRITIN 3,033*    Coagulation profile No results for input(s): INR, PROTIME in the last 168 hours.  Recent Labs    10/14/2019 1308  DDIMER 2.57*    Cardiac Enzymes No results for input(s): CKMB, TROPONINI, MYOGLOBIN in the last 168 hours.  Invalid input(s): CK ------------------------------------------------------------------------------------------------------------------ No results found for: BNP  Inpatient Medications  Scheduled Meds: Continuous Infusions: PRN Meds:.glycopyrrolate, LORazepam, morphine injection, morphine CONCENTRATE  Micro Results Recent Results (from the past 240 hour(s))  Blood Culture (routine x 2)     Status: None (Preliminary result)   Collection Time: 10/21/2019  1:43 PM   Specimen: Site Not Specified; Blood  Result Value Ref Range Status  Specimen Description   Final    SITE NOT SPECIFIED Performed at [facility] [address],  [place], [place] [address]    Special Requests   Final    BOTTLES DRAWN AEROBIC AND ANAEROBIC Blood Culture adequate volume Performed at [facility] [address] [place], [place] [address]    Culture   Final    NO GROWTH < 24 HOURS Performed at [facility] [address], [place], [place] [address]    Report Status PENDING  Incomplete  Blood Culture (routine x 2)     Status: None (Preliminary result)   Collection Time: 10/24/2019  5:02 PM   Specimen: BLOOD RIGHT HAND  Result Value Ref Range Status   Specimen Description   Final    BLOOD RIGHT HAND Performed at [facility] [address] [place], [place] [address]    Special Requests   Final    BOTTLES DRAWN AEROBIC ONLY Blood Culture results may not be optimal due to an inadequate volume of blood received in culture bottles Performed at [facility] [address] [place], [place] [address]    Culture   Final    NO GROWTH < 24 HOURS Performed at [facility] [address] [place], [place] [address]    Report Status PENDING  Incomplete  SARS CORONAVIRUS 2 (TAT 6-24 HRS) Nasopharyngeal Nasopharyngeal Swab     Status: Abnormal   Collection Time: 10/26/2019 10:31 PM   Specimen: Nasopharyngeal Swab  Result Value Ref Range Status   SARS Coronavirus 2 POSITIVE (A) NEGATIVE Final    Comment: RESULT CALLED TO, READ BACK BY AND VERIFIED WITH: Marrian Salvage RN 13:05 11/03/19 ([email or web address]) (NOTE) SARS-CoV-2 target nucleic acids are DETECTED. The SARS-CoV-2 RNA is generally detectable in upper and lower respiratory specimens during the acute phase of infection. Positive results are indicative of the presence of SARS-CoV-2 RNA. Clinical correlation with patient history and other diagnostic information is  necessary to determine patient infection status. Positive results do not rule out bacterial infection or co-infection with other viruses.  The expected result is  Negative. Fact Sheet for Patients: SugarRoll.be Fact Sheet for Healthcare Providers: https://www.woods-mathews.com/ This test is not yet approved or cleared by the [place] FDA and  has been authorized for detection and/or diagnosis of SARS-CoV-2 by FDA under an Emergency Use Authorization (EUA). This EUA will remain  in effect (meaning this test can be used) for t he duration of the COVID-19 declaration under Section 564(b)(1) of the Act, 21 U.S.C. section 360bbb-3(b)(1), unless the authorization is terminated or revoked sooner. Performed at [facility], [place] [address] [place], [place] [address]     Radiology Reports Dg Chest [place] 1 View  Result Date: 10/17/2019 CLINICAL DATA:  85 year old male with shortness of breath. Positive COVID-19 test. EXAM: PORTABLE CHEST 1 VIEW COMPARISON:  Chest x-ray 10/19/2019. FINDINGS: Marked worsening aeration in the lungs bilaterally, particularly throughout the left mid to lower lung as well as in the right lower lobe. No definite pleural effusions. No evidence of pulmonary edema. Heart size is mildly enlarged (unchanged). The patient is rotated to the left on today's exam, resulting in distortion of the mediastinal contours and reduced diagnostic sensitivity and specificity for mediastinal pathology. Status post median sternotomy for mitral valve replacement with mechanical mitral valve. Left-sided biventricular pacemaker/AICD with lead tips projecting over the expected location of the right atrium, right ventricle and overlying the lateral  wall the left ventricle via the coronary sinus and coronary veins. IMPRESSION: 1. Worsening multilobar bilateral pneumonia (left greater than right), as above. 2. Mild cardiomegaly. Electronically Signed   By: Vinnie Langton M.D.   On: 10/10/2019 13:48   Dg Chest Port 1 View  Result Date: 10/19/2019 CLINICAL DATA:  Cough, altered mental status EXAM: PORTABLE  CHEST 1 VIEW COMPARISON:  01/12/2019 FINDINGS: Left-sided implanted cardiac device with new generator. Leads in unchanged positioning. Median sternotomy with cardiac valve replacement. Stable cardiomegaly. Previously seen right upper lobe and right lower lobe airspace opacities have resolved. There is a hazy left lower lobe airspace opacity, new from prior. No pleural effusion or pneumothorax. IMPRESSION: 1. New hazy airspace opacity in the left lower lobe suspicious for pneumonia. 2. Resolved right upper lobe and right lower lobe airspace opacities. Electronically Signed   By: Davina Poke M.D.   On: 10/19/2019 15:03    Time Spent in minutes  15   Ercell Perlman M.D on 11/04/2019 at 10:55 AM  Between 7am to 7pm - Pager - [phone]  After 7pm go to [email or web address] - password [facility]  [facility] -  Office  [phone]

## 2019-11-05 DIAGNOSIS — U071 COVID-19: Secondary | ICD-10-CM | POA: Diagnosis not present

## 2019-11-05 DIAGNOSIS — J9601 Acute respiratory failure with hypoxia: Secondary | ICD-10-CM | POA: Diagnosis not present

## 2019-11-05 DIAGNOSIS — J189 Pneumonia, unspecified organism: Secondary | ICD-10-CM

## 2019-11-07 LAB — CULTURE, BLOOD (ROUTINE X 2)
Culture: NO GROWTH
Culture: NO GROWTH
Special Requests: ADEQUATE

## 2019-11-09 NOTE — Progress Notes (Signed)
Patient is resting comfortably. Daughter updated via phone call. Will continue to monitor.

## 2019-11-09 NOTE — Progress Notes (Signed)
PROGRESS NOTE                                                                                                                                                                                                             Patient Demographics:    Grant Lawrence, is a 85 y.o. male, DOB - 1931-01-14, ZA:[id number]  Admit date - 10/28/2019   Admitting Physician Shelly Coss, MD  Outpatient Primary MD for the patient is [facility]  LOS - 3   Chief Complaint  Patient presents with  . COVID POS  . Respiratory Distress       Brief Narrative  85 year old male with dementia, complete heart block status post pacemaker placement, A. fib, CAD, chronic diastolic CHF, mitral valve disease status with angioplasty symptom commonplace SNF with worsening respiratory symptoms.  He recently was hospitalized 2 weeks back for fever and confusion and found to have COVID-19 pneumonia and managed at [facility] and transferred back to SNF after treated with remdesivir and steroid. On presentation he was found to have acute hypoxic respiratory failure secondary to multifocal pneumonia and required 100% nonrebreather.  After discussion with daughter regarding patient's poor functional status and declining health patient was made comfort care.    Subjective:   [facility] called yesterday that patient had a bed but when heard patient having cheyne stokes respiration declined transfer  Assessment  & Plan :   Acute respiratory failure with hypoxemia Multifocal pneumonia On comfort measures only.  Palliative care consult appreciated.  Off antibiotics.  No blood draws and schedule vitals. Oxygen for comfort, morphine as needed for shortness of breath or pain. e.  Repeat Covid testing is positive.      Code Status : DNR, comfort measures  Family Communication: will update daughter on the phone.  Disposition  Plan  : anticipate hospital death  Consults  : Palliative care  Lab Results  Component Value Date   PLT 192 11/01/2019    Antibiotics  :  Anti-infectives (From admission, onward)   Start     Dose/Rate Route Frequency Ordered Stop   11/04/19 1000  remdesivir 100 mg in sodium chloride 0.9 % 250 mL IVPB  Status:  Discontinued     100 mg 500 mL/hr over 30 Minutes Intravenous Daily 10/10/2019 1813 11/01/2019 2031   11/03/19 1600  remdesivir 100 mg in sodium chloride 0.9 % 250 mL IVPB  Status:  Discontinued     100 mg 500 mL/hr over 30 Minutes Intravenous  Once 10/30/2019 1813 10/16/2019 2031   10/18/2019 1815  remdesivir 100 mg in sodium chloride 0.9 % 250 mL IVPB     100 mg 500 mL/hr over 30 Minutes Intravenous  Once 10/28/2019 1811 10/27/2019 2026   10/17/2019 1600  vancomycin (VANCOCIN) 1,250 mg in sodium chloride 0.9 % 250 mL IVPB     1,250 mg 166.7 mL/hr over 90 Minutes Intravenous  Once 10/22/2019 1554 10/14/2019 1947   11/01/2019 1545  ceFEPIme (MAXIPIME) 1 g in sodium chloride 0.9 % 100 mL IVPB  Status:  Discontinued     1 g 200 mL/hr over 30 Minutes Intravenous  Once 10/13/2019 1535 10/23/2019 1541   10/26/2019 1545  ceFEPIme (MAXIPIME) 2 g in sodium chloride 0.9 % 100 mL IVPB     2 g 200 mL/hr over 30 Minutes Intravenous  Once 10/16/2019 1541 10/30/2019 1948        Objective:   Vitals:   10/22/2019 2130 10/17/2019 2200 10/15/2019 2231 11/03/19 0007  BP: 128/60 124/70 (!) 156/72   Pulse: 70 71 68   Resp: 18 19 (!) 22   Temp:      TempSrc:      SpO2: 99% 100% 100%   Weight:    55.9 kg  Height:    6' (1.829 m)    Wt Readings from Last 3 Encounters:  11/03/19 55.9 kg  10/19/19 63.5 kg  09/01/19 63.1 kg     Intake/Output Summary (Last 24 hours) at 15-Nov-2019 1540 Last data filed at 15-Nov-2019 0525 Gross per 24 hour  Intake -  Output 450 ml  Net -450 ml     Physical Exam  Patient monitored peripherally given active COVID-19 infection and comfort measure status.    Data Review:    CBC  Recent Labs  Lab 10/23/2019 1308  WBC 12.0*  HGB 15.8  HCT 50.7  PLT 192  MCV 104.1*  MCH 32.4  MCHC 31.2  RDW 13.0  LYMPHSABS 0.7  MONOABS 0.7  EOSABS 0.0  BASOSABS 0.0    Chemistries  Recent Labs  Lab 10/13/2019 1308  NA 150*  K 4.3  CL 111  CO2 27  GLUCOSE 110*  BUN 43*  CREATININE 0.79  CALCIUM 10.6*  AST 27  ALT 17  ALKPHOS 81  BILITOT 1.9*   ------------------------------------------------------------------------------------------------------------------ No results for input(s): CHOL, HDL, LDLCALC, TRIG, CHOLHDL, LDLDIRECT in the last 72 hours.  No results found for: HGBA1C ------------------------------------------------------------------------------------------------------------------ No results for input(s): TSH, T4TOTAL, T3FREE, THYROIDAB in the last 72 hours.  Invalid input(s): FREET3 ------------------------------------------------------------------------------------------------------------------ No results for input(s): VITAMINB12, FOLATE, FERRITIN, TIBC, IRON, RETICCTPCT in the last 72 hours.  Coagulation profile No results for input(s): INR, PROTIME in the last 168 hours.  No results for input(s): DDIMER in the last 72 hours.  Cardiac Enzymes No results for input(s): CKMB, TROPONINI, MYOGLOBIN in the last 168 hours.  Invalid input(s): CK ------------------------------------------------------------------------------------------------------------------ No results found for: BNP  Inpatient Medications  Scheduled Meds: Continuous Infusions: PRN Meds:.glycopyrrolate, LORazepam, morphine injection, morphine CONCENTRATE  Micro Results Recent Results (from the past 240 hour(s))  Blood Culture (routine x 2)     Status: None (Preliminary result)   Collection Time: 10/31/2019  1:43 PM   Specimen: BLOOD  Result Value Ref Range Status   Specimen Description   Final    BLOOD SITE NOT SPECIFIED  Performed at [facility], [place] [address],  [place], [place] [address]    Special Requests   Final    BOTTLES DRAWN AEROBIC AND ANAEROBIC Blood Culture adequate volume Performed at [place] [address], [place], [place] [address]    Culture   Final    NO GROWTH 3 DAYS Performed at [facility], [place] [address], [place], [place] [address]    Report Status PENDING  Incomplete  Blood Culture (routine x 2)     Status: None (Preliminary result)   Collection Time: 10/18/2019  5:02 PM   Specimen: BLOOD RIGHT HAND  Result Value Ref Range Status   Specimen Description   Final    BLOOD RIGHT HAND Performed at [place] [address] [place], [place] [address]    Special Requests   Final    BOTTLES DRAWN AEROBIC ONLY Blood Culture results may not be optimal due to an inadequate volume of blood received in culture bottles Performed at [place] [address] [place], [place] [address]    Culture   Final    NO GROWTH 3 DAYS Performed at [facility], [place] [address] [place], [place] [address]    Report Status PENDING  Incomplete  SARS CORONAVIRUS 2 (TAT 6-24 HRS) Nasopharyngeal Nasopharyngeal Swab     Status: Abnormal   Collection Time: 11/06/2019 10:31 PM   Specimen: Nasopharyngeal Swab  Result Value Ref Range Status   SARS Coronavirus 2 POSITIVE (A) NEGATIVE Final    Comment: RESULT CALLED TO, READ BACK BY AND VERIFIED WITH: Marrian Salvage RN 13:05 11/03/19 ([email or web address]) (NOTE) SARS-CoV-2 target nucleic acids are DETECTED. The SARS-CoV-2 RNA is generally detectable in upper and lower respiratory specimens during the acute phase of infection. Positive results are indicative of the presence of SARS-CoV-2 RNA. Clinical correlation with patient history and other diagnostic information is  necessary to determine patient infection status. Positive results do not rule out bacterial infection or co-infection with other viruses.  The expected result is Negative. Fact  Sheet for Patients: SugarRoll.be Fact Sheet for Healthcare Providers: https://www.woods-mathews.com/ This test is not yet approved or cleared by the [place] FDA and  has been authorized for detection and/or diagnosis of SARS-CoV-2 by FDA under an Emergency Use Authorization (EUA). This EUA will remain  in effect (meaning this test can be used) for t he duration of the COVID-19 declaration under Section 564(b)(1) of the Act, 21 U.S.C. section 360bbb-3(b)(1), unless the authorization is terminated or revoked sooner. Performed at [facility], [place] [address] [place], [place] [address]     Radiology Reports Dg Chest [place] 1 View  Result Date: 10/16/2019 CLINICAL DATA:  85 year old male with shortness of breath. Positive COVID-19 test. EXAM: PORTABLE CHEST 1 VIEW COMPARISON:  Chest x-ray 10/19/2019. FINDINGS: Marked worsening aeration in the lungs bilaterally, particularly throughout the left mid to lower lung as well as in the right lower lobe. No definite pleural effusions. No evidence of pulmonary edema. Heart size is mildly enlarged (unchanged). The patient is rotated to the left on today's exam, resulting in distortion of the mediastinal contours and reduced diagnostic sensitivity and specificity for mediastinal pathology. Status post median sternotomy for mitral valve replacement with mechanical mitral valve. Left-sided biventricular pacemaker/AICD with lead tips projecting over the expected location of the right atrium, right ventricle and overlying the lateral wall the left ventricle via the coronary sinus and coronary veins. IMPRESSION: 1.  Worsening multilobar bilateral pneumonia (left greater than right), as above. 2. Mild cardiomegaly. Electronically Signed   By: Vinnie Langton M.D.   On: 10/21/2019 13:48   Dg Chest Port 1 View  Result Date: 10/19/2019 CLINICAL DATA:  Cough, altered mental status EXAM: PORTABLE CHEST 1 VIEW  COMPARISON:  01/12/2019 FINDINGS: Left-sided implanted cardiac device with new generator. Leads in unchanged positioning. Median sternotomy with cardiac valve replacement. Stable cardiomegaly. Previously seen right upper lobe and right lower lobe airspace opacities have resolved. There is a hazy left lower lobe airspace opacity, new from prior. No pleural effusion or pneumothorax. IMPRESSION: 1. New hazy airspace opacity in the left lower lobe suspicious for pneumonia. 2. Resolved right upper lobe and right lower lobe airspace opacities. Electronically Signed   By: Davina Poke M.D.   On: 10/19/2019 15:03    Time Spent in minutes  15   Koran Seabrook M.D on Nov 09, 2019 at 3:40 PM  Between 7am to 7pm - Pager - [phone]  After 7pm go to [email or web address] - password [facility]  [facility] -  Office  [phone]

## 2019-11-09 NOTE — Discharge Summary (Signed)
Physician Discharge Summary  Grant Lawrence [id number] DOB: 06/29/1931 DOA: 11/03/2019  [facility]P: [facility]  Admit date: 10/20/2019 Discharge date: 11/06/2019  Admitted From: Home Disposition: Patient expired on 11-16-19 at 7:30 PM        Discharge Diagnoses:  Active Problems: Acute respiratory failure with hypoxemia (HCC)   COVID-19 infection with multifocal pneumonia   HPI/Hospital course 85 year old male with dementia, complete heart block status post pacemaker placement, A. fib, CAD, chronic diastolic CHF, mitral valve disease status with angioplasty symptom commonplace SNF with worsening respiratory symptoms.  He recently was hospitalized 2 weeks back for fever and confusion and found to have COVID-19 pneumonia and managed at [facility] and transferred back to SNF after treated with remdesivir and steroid. On presentation he was found to have acute hypoxic respiratory failure secondary to multifocal pneumonia and required 100% nonrebreather.  After discussion with daughter regarding patient's poor functional status and declining health patient was made comfort care.   Patient made comfort measures with initial plan for discharge to [facility].  Placed on as needed morphine for pain and comfort and as needed Ativan for anxiety/agitation. Patient expired on 11/16/19 at 7:30 PM  Daughter involved in care who was informed.   Discharge Instructions   Allergies as of November 16, 2019      Reactions   Rosuvastatin Calcium Other (See Comments)   Myalgia   Statins Other (See Comments)   Myalgia        Allergies  Allergen Reactions  . Rosuvastatin Calcium Other (See Comments)    Myalgia  . Statins Other (See Comments)    Myalgia       Procedures/Studies: Dg Chest Port 1 View  Result Date: 11/03/2019 CLINICAL DATA:  85 year old male with shortness of breath. Positive COVID-19 test. EXAM: PORTABLE CHEST 1 VIEW COMPARISON:   Chest x-ray 10/19/2019. FINDINGS: Marked worsening aeration in the lungs bilaterally, particularly throughout the left mid to lower lung as well as in the right lower lobe. No definite pleural effusions. No evidence of pulmonary edema. Heart size is mildly enlarged (unchanged). The patient is rotated to the left on today's exam, resulting in distortion of the mediastinal contours and reduced diagnostic sensitivity and specificity for mediastinal pathology. Status post median sternotomy for mitral valve replacement with mechanical mitral valve. Left-sided biventricular pacemaker/AICD with lead tips projecting over the expected location of the right atrium, right ventricle and overlying the lateral wall the left ventricle via the coronary sinus and coronary veins. IMPRESSION: 1. Worsening multilobar bilateral pneumonia (left greater than right), as above. 2. Mild cardiomegaly. Electronically Signed   By: Vinnie Langton M.D.   On: 10/11/2019 13:48   Dg Chest Port 1 View  Result Date: 10/19/2019 CLINICAL DATA:  Cough, altered mental status EXAM: PORTABLE CHEST 1 VIEW COMPARISON:  01/12/2019 FINDINGS: Left-sided implanted cardiac device with new generator. Leads in unchanged positioning. Median sternotomy with cardiac valve replacement. Stable cardiomegaly. Previously seen right upper lobe and right lower lobe airspace opacities have resolved. There is a hazy left lower lobe airspace opacity, new from prior. No pleural effusion or pneumothorax. IMPRESSION: 1. New hazy airspace opacity in the left lower lobe suspicious for pneumonia. 2. Resolved right upper lobe and right lower lobe airspace opacities. Electronically Signed   By: Davina Poke M.D.   On: 10/19/2019 15:03       Discharge Exam: Vitals:   11/04/2019 2200 10/19/2019 2231  BP: 124/70 (!) 156/72  Pulse: 71 68  Resp: 19 (!)  22  Temp:    SpO2: 100% 100%   Vitals:   10/10/2019 2200 10/28/2019 2231 11/03/19 0007 November 24, 2019 1958  BP: 124/70 (!)  156/72    Pulse: 71 68    Resp: 19 (!) 22    Temp:      TempSrc:      SpO2: 100% 100%    Weight:   55.9 kg 55.9 kg  Height:   6' (1.829 m) 6' (1.829 m)       The results of significant diagnostics from this hospitalization (including imaging, microbiology, ancillary and laboratory) are listed below for reference.     Microbiology: Recent Results (from the past 240 hour(s))  Blood Culture (routine x 2)     Status: None (Preliminary result)   Collection Time: 10/17/2019  1:43 PM   Specimen: BLOOD  Result Value Ref Range Status   Specimen Description   Final    BLOOD SITE NOT SPECIFIED Performed at [facility], [address] [place], [place] [address]    Special Requests   Final    BOTTLES DRAWN AEROBIC AND ANAEROBIC Blood Culture adequate volume Performed at [place] [address], [place], [place] [address]    Culture   Final    NO GROWTH 4 DAYS Performed at [facility], [place] [address], [place], [place] [address]    Report Status PENDING  Incomplete  Blood Culture (routine x 2)     Status: None (Preliminary result)   Collection Time: 10/10/2019  5:02 PM   Specimen: BLOOD RIGHT HAND  Result Value Ref Range Status   Specimen Description   Final    BLOOD RIGHT HAND Performed at [place] [address], [place], [place] [address]    Special Requests   Final    BOTTLES DRAWN AEROBIC ONLY Blood Culture results may not be optimal due to an inadequate volume of blood received in culture bottles Performed at [place] [address], [place], [place] [address]    Culture   Final    NO GROWTH 4 DAYS Performed at [facility], [place] [address], [place], [place] [address]    Report Status PENDING  Incomplete  SARS CORONAVIRUS 2 (TAT 6-24 HRS) Nasopharyngeal Nasopharyngeal Swab     Status: Abnormal   Collection Time: 10/11/2019 10:31 PM   Specimen: Nasopharyngeal Swab  Result Value Ref Range  Status   SARS Coronavirus 2 POSITIVE (A) NEGATIVE Final    Comment: RESULT CALLED TO, READ BACK BY AND VERIFIED WITH: Marrian Salvage RN 13:05 11/03/19 ([email or web address]) (NOTE) SARS-CoV-2 target nucleic acids are DETECTED. The SARS-CoV-2 RNA is generally detectable in upper and lower respiratory specimens during the acute phase of infection. Positive results are indicative of the presence of SARS-CoV-2 RNA. Clinical correlation with patient history and other diagnostic information is  necessary to determine patient infection status. Positive results do not rule out bacterial infection or co-infection with other viruses.  The expected result is Negative. Fact Sheet for Patients: SugarRoll.be Fact Sheet for Healthcare Providers: https://www.woods-mathews.com/ This test is not yet approved or cleared by the [place] FDA and  has been authorized for detection and/or diagnosis of SARS-CoV-2 by FDA under an Emergency Use Authorization (EUA). This EUA will remain  in effect (meaning this test can be used) for t he duration of the COVID-19 declaration under Section 564(b)(1) of the Act, 21 U.S.C. section 360bbb-3(b)(1), unless the authorization is terminated or revoked sooner.  Performed at [facility], [place] [address] [place], [place] [address]      Labs: BNP (last 3 results) No results for input(s): BNP in the last 8760 hours. Basic Metabolic Panel: Recent Labs  Lab 10/10/2019 1308  NA 150*  K 4.3  CL 111  CO2 27  GLUCOSE 110*  BUN 43*  CREATININE 0.79  CALCIUM 10.6*   Liver Function Tests: Recent Labs  Lab 10/24/2019 1308  AST 27  ALT 17  ALKPHOS 81  BILITOT 1.9*  PROT 7.7  ALBUMIN 3.0*   No results for input(s): LIPASE, AMYLASE in the last 168 hours. No results for input(s): AMMONIA in the last 168 hours. CBC: Recent Labs  Lab 10/18/2019 1308  WBC 12.0*  NEUTROABS 10.5*  HGB 15.8  HCT 50.7  MCV 104.1*  PLT 192   Cardiac  Enzymes: No results for input(s): CKTOTAL, CKMB, CKMBINDEX, TROPONINI in the last 168 hours. BNP: Invalid input(s): POCBNP CBG: No results for input(s): GLUCAP in the last 168 hours. D-Dimer No results for input(s): DDIMER in the last 72 hours. Hgb A1c No results for input(s): HGBA1C in the last 72 hours. Lipid Profile No results for input(s): CHOL, HDL, LDLCALC, TRIG, CHOLHDL, LDLDIRECT in the last 72 hours. Thyroid function studies No results for input(s): TSH, T4TOTAL, T3FREE, THYROIDAB in the last 72 hours.  Invalid input(s): FREET3 Anemia work up No results for input(s): VITAMINB12, FOLATE, FERRITIN, TIBC, IRON, RETICCTPCT in the last 72 hours. Urinalysis    Component Value Date/Time   COLORURINE AMBER (A) 10/19/2019 1426   APPEARANCEUR CLEAR 11/03/2019 1426   LABSPEC 1.028 10/12/2019 1426   PHURINE 5.0 10/24/2019 1426   GLUCOSEU NEGATIVE 11/08/2019 1426   HGBUR NEGATIVE 10/11/2019 1426   BILIRUBINUR NEGATIVE 10/27/2019 1426   BILIRUBINUR n 02/27/2017 1400   KETONESUR 5 (A) 11/04/2019 1426   PROTEINUR 30 (A) 10/19/2019 1426   UROBILINOGEN 0.2 02/27/2017 1400   NITRITE NEGATIVE 10/26/2019 1426   LEUKOCYTESUR NEGATIVE 10/30/2019 1426   Sepsis Labs Invalid input(s): PROCALCITONIN,  WBC,  LACTICIDVEN Microbiology Recent Results (from the past 240 hour(s))  Blood Culture (routine x 2)     Status: None (Preliminary result)   Collection Time: 10/16/2019  1:43 PM   Specimen: BLOOD  Result Value Ref Range Status   Specimen Description   Final    BLOOD SITE NOT SPECIFIED Performed at [facility], [place] [address], [place], [place] [address]    Special Requests   Final    BOTTLES DRAWN AEROBIC AND ANAEROBIC Blood Culture adequate volume Performed at [place] [address], [place], [place] [address]    Culture   Final    NO GROWTH 4 DAYS Performed at [facility], [place] [address], [place], [place] [address]    Report Status PENDING   Incomplete  Blood Culture (routine x 2)     Status: None (Preliminary result)   Collection Time: 11/06/2019  5:02 PM   Specimen: BLOOD RIGHT HAND  Result Value Ref Range Status   Specimen Description   Final    BLOOD RIGHT HAND Performed at [place] [address] [place], [place] [address]    Special Requests   Final    BOTTLES DRAWN AEROBIC ONLY Blood Culture results may not be optimal due to an inadequate volume of blood received in culture bottles Performed at [place] [address], [place], [place] [address]    Culture  Final    NO GROWTH 4 DAYS Performed at [facility], [place] [address], [place], [place] [address]    Report Status PENDING  Incomplete  SARS CORONAVIRUS 2 (TAT 6-24 HRS) Nasopharyngeal Nasopharyngeal Swab     Status: Abnormal   Collection Time: 11/01/2019 10:31 PM   Specimen: Nasopharyngeal Swab  Result Value Ref Range Status   SARS Coronavirus 2 POSITIVE (A) NEGATIVE Final    Comment: RESULT CALLED TO, READ BACK BY AND VERIFIED WITH: Marrian Salvage RN 13:05 11/03/19 ([email or web address]) (NOTE) SARS-CoV-2 target nucleic acids are DETECTED. The SARS-CoV-2 RNA is generally detectable in upper and lower respiratory specimens during the acute phase of infection. Positive results are indicative of the presence of SARS-CoV-2 RNA. Clinical correlation with patient history and other diagnostic information is  necessary to determine patient infection status. Positive results do not rule out bacterial infection or co-infection with other viruses.  The expected result is Negative. Fact Sheet for Patients: SugarRoll.be Fact Sheet for Healthcare Providers: https://www.woods-mathews.com/ This test is not yet approved or cleared by the [place] FDA and  has been authorized for detection and/or diagnosis of SARS-CoV-2 by FDA under an Emergency Use Authorization (EUA). This EUA will remain   in effect (meaning this test can be used) for t he duration of the COVID-19 declaration under Section 564(b)(1) of the Act, 21 U.S.C. section 360bbb-3(b)(1), unless the authorization is terminated or revoked sooner. Performed at [facility], [place] [address], [place], [place] [address]      Time coordinating discharge: 25 minutes  SIGNED:   Louellen Molder, MD  [facility] 11/06/2019, 3:42 PM Pager   If 7PM-7AM, please contact night-coverage [email or web address] Password [email or web address]

## 2019-11-09 NOTE — Final Progress Note (Signed)
Pt found dead at [facility] checked with another nurse Dellie Catholic RN. MD Bodenheimer made aware Family called.

## 2019-11-09 NOTE — TOC Progression Note (Signed)
Transition of Care [facility]) - Progression Note    Patient Details  Name: Grant Lawrence MRN: [id number] Date of Birth: 1931-10-20  [facility] of [facility]) CM/SW Contact  Elius Etheredge, Juliann Pulse, RN Phone Number: 11-17-19, 9:40 AM  Clinical Narrative: [facility] received call from [facility]-they had a bed available-per nsg-patient was too unstable to transfer.       Expected Discharge Plan: [place] Barriers to Discharge: No Barriers Identified  Expected Discharge Plan and Services Expected Discharge Plan: [place]                                               Social Determinants of Health (SDOH) Interventions    Readmission Risk Interventions No flowsheet data found.

## 2019-11-09 DEATH — deceased

## 2021-02-27 IMAGING — DX DG CHEST 1V PORT
1 series · 1 of 1 positions shown · non-contrast
Comparison: 01/12/2019

CLINICAL DATA: Cough, altered mental status

EXAM:
PORTABLE CHEST 1 VIEW

[chest ap]
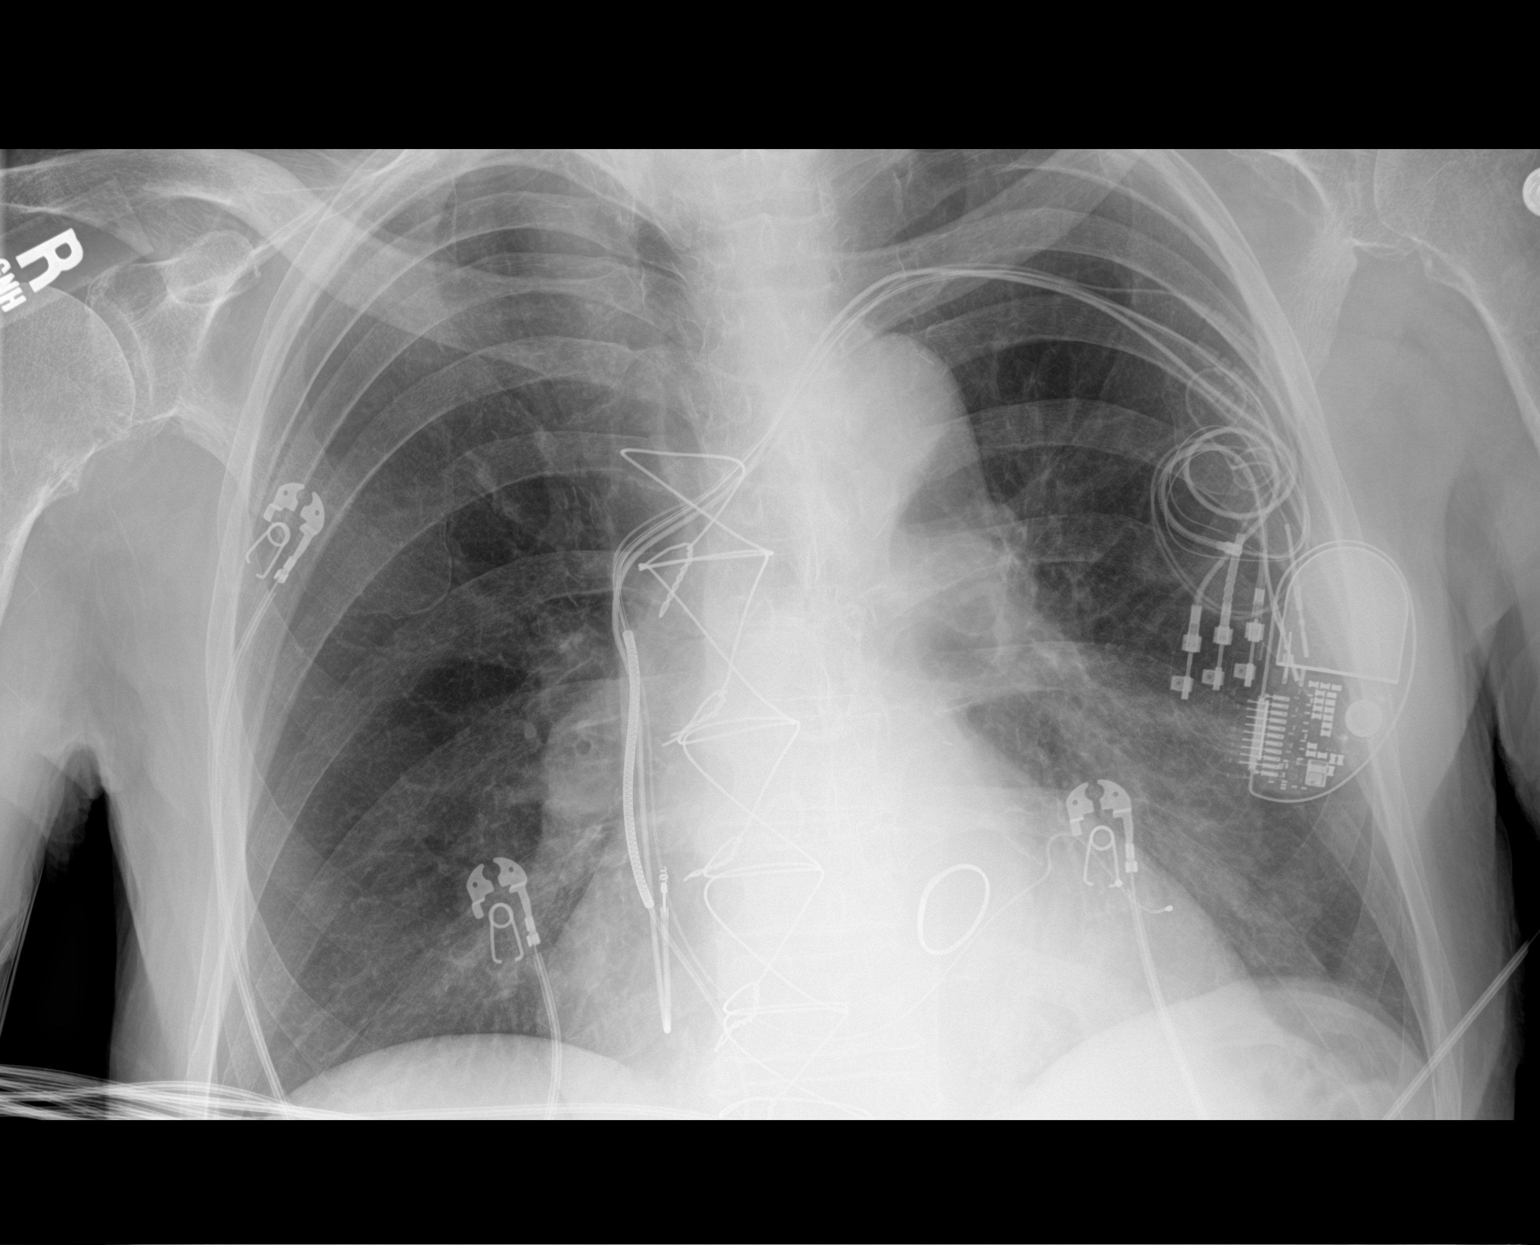

[1 of 1 positions shown; findings below may reference images not displayed]

FINDINGS: Left-sided implanted cardiac device with new generator. Leads in
unchanged positioning. Median sternotomy with cardiac valve
replacement. Stable cardiomegaly. Previously seen right upper lobe
and right lower lobe airspace opacities have resolved. There is a
hazy left lower lobe airspace opacity, new from prior. No pleural
effusion or pneumothorax.
IMPRESSION: 1. New hazy airspace opacity in the left lower lobe suspicious for
pneumonia.
2. Resolved right upper lobe and right lower lobe airspace
opacities.
